# Patient Record
Sex: Male | Born: 1945
Health system: Southern US, Community
[De-identification: ages and names within clinical notes are randomized; demographics above are authoritative.]

## PROBLEM LIST (undated history)

## (undated) ENCOUNTER — Ambulatory Visit

## (undated) DIAGNOSIS — I10 Essential (primary) hypertension: Secondary | ICD-10-CM

## (undated) DIAGNOSIS — E039 Hypothyroidism, unspecified: Secondary | ICD-10-CM

## (undated) DIAGNOSIS — I493 Ventricular premature depolarization: Secondary | ICD-10-CM

## (undated) DIAGNOSIS — I2510 Atherosclerotic heart disease of native coronary artery without angina pectoris: Secondary | ICD-10-CM

## (undated) DIAGNOSIS — R011 Cardiac murmur, unspecified: Secondary | ICD-10-CM

## (undated) DIAGNOSIS — E079 Disorder of thyroid, unspecified: Secondary | ICD-10-CM

## (undated) DIAGNOSIS — I5189 Other ill-defined heart diseases: Secondary | ICD-10-CM

## (undated) DIAGNOSIS — M1990 Unspecified osteoarthritis, unspecified site: Secondary | ICD-10-CM

## (undated) DIAGNOSIS — T7840XA Allergy, unspecified, initial encounter: Secondary | ICD-10-CM

## (undated) HISTORY — DX: Hypothyroidism, unspecified: E03.9

## (undated) HISTORY — DX: Atherosclerotic heart disease of native coronary artery without angina pectoris: I25.10

## (undated) HISTORY — DX: Cardiac murmur, unspecified: R01.1

## (undated) HISTORY — DX: Allergy, unspecified, initial encounter: T78.40XA

## (undated) HISTORY — PX: HEMORRHOID SURGERY: SHX153

## (undated) HISTORY — PX: VASECTOMY: SHX75

## (undated) HISTORY — DX: Disorder of thyroid, unspecified: E07.9

## (undated) HISTORY — DX: Unspecified osteoarthritis, unspecified site: M19.90

## (undated) HISTORY — DX: Essential (primary) hypertension: I10

## (undated) HISTORY — DX: Other ill-defined heart diseases: I51.89

## (undated) HISTORY — DX: Ventricular premature depolarization: I49.3

## (undated) HISTORY — PX: COLONOSCOPY: SHX174

---

## 2000-04-24 ENCOUNTER — Ambulatory Visit (HOSPITAL_BASED_OUTPATIENT_CLINIC_OR_DEPARTMENT_OTHER): Admission: RE | Admit: 2000-04-24 | Discharge: 2000-04-24 | Payer: Self-pay | Admitting: General Surgery

## 2000-04-24 ENCOUNTER — Encounter (INDEPENDENT_AMBULATORY_CARE_PROVIDER_SITE_OTHER): Payer: Self-pay | Admitting: *Deleted

## 2007-09-20 ENCOUNTER — Ambulatory Visit: Payer: Self-pay | Admitting: Internal Medicine

## 2007-10-02 ENCOUNTER — Ambulatory Visit: Payer: Self-pay | Admitting: Internal Medicine

## 2012-08-07 ENCOUNTER — Encounter: Payer: Self-pay | Admitting: Emergency Medicine

## 2012-10-08 ENCOUNTER — Ambulatory Visit (INDEPENDENT_AMBULATORY_CARE_PROVIDER_SITE_OTHER): Payer: Medicare Other | Admitting: Emergency Medicine

## 2012-10-08 ENCOUNTER — Ambulatory Visit: Payer: Medicare Other

## 2012-10-08 VITALS — BP 180/77 | HR 72 | Temp 98.2°F | Resp 16 | Ht 67.0 in | Wt 209.0 lb

## 2012-10-08 DIAGNOSIS — M25511 Pain in right shoulder: Secondary | ICD-10-CM

## 2012-10-08 DIAGNOSIS — Z0000 Encounter for general adult medical examination without abnormal findings: Secondary | ICD-10-CM

## 2012-10-08 DIAGNOSIS — I10 Essential (primary) hypertension: Secondary | ICD-10-CM | POA: Insufficient documentation

## 2012-10-08 DIAGNOSIS — E782 Mixed hyperlipidemia: Secondary | ICD-10-CM

## 2012-10-08 DIAGNOSIS — E039 Hypothyroidism, unspecified: Secondary | ICD-10-CM | POA: Insufficient documentation

## 2012-10-08 DIAGNOSIS — M25519 Pain in unspecified shoulder: Secondary | ICD-10-CM

## 2012-10-08 LAB — LIPID PANEL
Cholesterol: 173 mg/dL (ref 0–200)
HDL: 37 mg/dL — ABNORMAL LOW (ref >39)
Triglycerides: 113 mg/dL (ref <150)

## 2012-10-08 LAB — POCT URINALYSIS DIPSTICK
Blood, UA: NEGATIVE
Protein, UA: NEGATIVE
Spec Grav, UA: 1.015
Urobilinogen, UA: 0.2
pH, UA: 6.5

## 2012-10-08 LAB — COMPREHENSIVE METABOLIC PANEL
BUN: 10 mg/dL (ref 6–23)
CO2: 30 mEq/L (ref 19–32)
Calcium: 9.5 mg/dL (ref 8.4–10.5)
Chloride: 104 mEq/L (ref 96–112)
Creat: 0.88 mg/dL (ref 0.50–1.35)
Glucose, Bld: 100 mg/dL — ABNORMAL HIGH (ref 70–99)
Total Bilirubin: 0.8 mg/dL (ref 0.3–1.2)

## 2012-10-08 LAB — POCT UA - MICROSCOPIC ONLY
Bacteria, U Microscopic: NEGATIVE
Casts, Ur, LPF, POC: NEGATIVE
Crystals, Ur, HPF, POC: NEGATIVE
Mucus, UA: NEGATIVE

## 2012-10-08 LAB — POCT CBC
HCT, POC: 45.4 % (ref 43.5–53.7)
MCH, POC: 34.3 pg — AB (ref 27–31.2)
MCV: 106 fL — AB (ref 80–97)
MID (cbc): 0.4 (ref 0–0.9)
Platelet Count, POC: 203 10*3/uL (ref 142–424)
RBC: 4.28 M/uL — AB (ref 4.69–6.13)
WBC: 5.1 10*3/uL (ref 4.6–10.2)

## 2012-10-08 MED ORDER — DILTIAZEM HCL ER BEADS 180 MG PO CP24: 180.0000 mg | ORAL_CAPSULE | Freq: Every day | ORAL | Status: DC

## 2012-10-08 MED ORDER — LEVOTHYROXINE SODIUM 88 MCG PO TABS: 88.0000 ug | ORAL_TABLET | Freq: Every day | ORAL | Status: DC

## 2012-10-08 NOTE — Progress Notes (Signed)
@[facility] @  Patient ID: Terry Vega MRN: [id number], DOB: September 05, 1946 66 y.o. Date of Encounter: 10/08/2012, 8:38 AM  Primary Physician: Lucilla Edin, MD  Chief Complaint: Physical (CPE)  HPI: 66 y.o. y/o male with history noted below here for CPE.  Doing well. No issues/complaints.  Review of Systems:  Consitutional: No fever, chills, fatigue, night sweats, lymphadenopathy, or weight changes. Eyes: No visual changes, eye redness, or discharge. ENT/Mouth: Ears: No otalgia, tinnitus, hearing loss, discharge. Nose: No congestion, rhinorrhea, sinus pain, or epistaxis. Throat: No sore throat, post nasal drip, or teeth pain. Cardiovascular: No CP, palpitations, diaphoresis, DOE, edema, orthopnea, PND. Respiratory: No cough, hemoptysis, SOB, or wheezing. Gastrointestinal: No anorexia, dysphagia, reflux, pain, nausea, vomiting, hematemesis, diarrhea, constipation, BRBPR, or melena. Genitourinary: No dysuria, frequency, urgency, hematuria, incontinence, nocturia, decreased urinary stream, discharge, impotence, or testicular pain/masses. Musculoskeletal: No decreased ROM, myalgias, stiffness, joint swelling, or weakness. He has had some discomfort in his right shoulder he feels is secondary to overuse with pain on abduction of the shoulder. He trouble with his knee last year but these symptoms resolved Skin: No rash, erythema, lesion changes, pain, warmth, jaundice, or pruritis. Neurological: No headache, dizziness, syncope, seizures, tremors, memory loss, coordination problems, or paresthesias. Psychological: No anxiety, depression, hallucinations, SI/HI. Endocrine: No fatigue, polydipsia, polyphagia, polyuria, or known diabetes. All other systems were reviewed and are otherwise negative.  Past Medical History  Diagnosis Date  . Hypertension   . Thyroid disease      Past Surgical History  Procedure Date  . Vasectomy     Home Meds:  Prior to Admission medications   Medication Sig  Start Date End Date Taking? Authorizing Provider  cholecalciferol (VITAMIN D) 1000 UNITS tablet Take 1,000 Units by mouth daily.   Yes Historical Provider, MD  diltiazem (TIAZAC) 180 MG 24 hr capsule Take 180 mg by mouth daily.   Yes Historical Provider, MD  levothyroxine (SYNTHROID, LEVOTHROID) 88 MCG tablet Take 88 mcg by mouth daily.   Yes Historical Provider, MD  Multiple Vitamin (MULTIVITAMIN) tablet Take 1 tablet by mouth daily.   Yes Historical Provider, MD  vitamin E 1000 UNIT capsule Take 1,000 Units by mouth daily.   Yes Historical Provider, MD    Allergies: Allergies not on file  History   Social History  . Marital Status: Married    Spouse Name: N/A    Number of Children: N/A  . Years of Education: N/A   Occupational History  . Not on file.   Social History Main Topics  . Smoking status: Never Smoker   . Smokeless tobacco: Never Used  . Alcohol Use: Yes  . Drug Use: No  . Sexually Active: Yes    Birth Control/ Protection: None   Other Topics Concern  . Not on file   Social History Narrative  . No narrative on file    Family History  Problem Relation Age of Onset  . Heart disease Mother   . Cancer Father   . Stroke Maternal Grandmother     Physical Exam:  Blood pressure 180/77, pulse 72, temperature 98.2 F (36.8 C), temperature source Oral, resp. rate 16, height 5\' 7"  (1.702 m), weight 209 lb (94.802 kg).  General: Well developed, well nourished, in no acute distress. HEENT: Normocephalic, atraumatic. Conjunctiva pink, sclera non-icteric. Pupils 2 mm constricting to 1 mm, round, regular, and equally reactive to light and accomodation. EOMI. Internal auditory canal clear. TMs with good cone of light and without pathology. Nasal mucosa pink.  Nares are without discharge. No sinus tenderness. Oral mucosa pink. Dentition no caries or dental disease . Pharynx without exudate.   Neck: Supple. Trachea midline. No thyromegaly. Full ROM. No lymphadenopathy. Lungs:  Clear to auscultation bilaterally without wheezes, rales, or rhonchi. Breathing is of normal effort and unlabored. Cardiovascular: RRR with S1 S2. No murmurs, rubs, or gallops appreciated. Distal pulses 2+ symmetrically. No carotid or abdominal bruits.  Abdomen: Soft, non-tender, non-distended with normoactive bowel sounds. No hepatosplenomegaly or masses. No rebound/guarding. No CVA tenderness. Without hernias.  Rectal: No external hemorrhoids or fissures. Rectal vault without masses.   Genitourinary:   circumcised male. No penile lesions. Testes descended bilaterally, and smooth without tenderness or masses.  Musculoskeletal: Full range of motion and 5/5 strength throughout. Without swelling, atrophy, tenderness, crepitus, or warmth. Extremities without clubbing, cyanosis, or edema. Calves supple. Skin: Warm and moist without erythema, ecchymosis, wounds, or rash. Neuro: A+Ox3. CN II-XII grossly intact. Moves all extremities spontaneously. Full sensation throughout. Normal gait. DTR 2+ throughout upper and lower extremities. Finger to nose intact. Psych:  Responds to questions appropriately with a normal affect.   Results for orders placed in visit on 10/08/12  POCT CBC      Component Value Range   WBC 5.1  4.6 - 10.2 K/uL   Lymph, poc 1.5  0.6 - 3.4   POC LYMPH PERCENT 29.9  10 - 50 %L   MID (cbc) 0.4  0 - 0.9   POC MID % 7.4  0 - 12 %M   POC Granulocyte 3.2  2 - 6.9   Granulocyte percent 62.7  37 - 80 %G   RBC 4.28 (*) 4.69 - 6.13 M/uL   Hemoglobin 14.7  14.1 - 18.1 g/dL   HCT, POC 16.1  09.6 - 53.7 %   MCV 106.0 (*) 80 - 97 fL   MCH, POC 34.3 (*) 27 - 31.2 pg   MCHC 32.4  31.8 - 35.4 g/dL   RDW, POC 04.5     Platelet Count, POC 203  142 - 424 K/uL   MPV 8.1  0 - 99.8 fL  POCT UA - MICROSCOPIC ONLY      Component Value Range   WBC, Ur, HPF, POC neg     RBC, urine, microscopic neg     Bacteria, U Microscopic neg     Mucus, UA neg     Epithelial cells, urine per micros 0-1       Crystals, Ur, HPF, POC neg     Casts, Ur, LPF, POC neg     Yeast, UA neg    POCT URINALYSIS DIPSTICK      Component Value Range   Color, UA yellow     Clarity, UA clear     Glucose, UA neg     Bilirubin, UA neg     Ketones, UA neg     Spec Grav, UA 1.015     Blood, UA neg     pH, UA 6.5     Protein, UA neg     Urobilinogen, UA 0.2     Nitrite, UA neg     Leukocytes, UA Negative    IFOBT (OCCULT BLOOD)      Component Value Range   IFOBT Negative    [facility] reading (PRIMARY) by  Dr. Cleta Alberts there are degenerative changes at the a.c. joint but no other abnormality .  Studies:   CMET, Lipid, PSA,        Assessment/Plan   66  y.o. y/o  male here for CPE. He is in good health he does not smoke is up-to-date on his colonoscopy and his family history is unremarkable. He does have a history of congestive heart failure in his mother and prostate cancer his father but he was 74-his blood pressure is night or he would need to follow this closely. He is on blood pressure medications and thyroid medications. I spoke to the patient regarding his elevated blood pressure he is going to start checking it more regularly and take his medication on a regular basis .  Signed, Earl Lites, MD 10/08/2012 8:38 AM

## 2012-10-09 ENCOUNTER — Encounter: Payer: Self-pay | Admitting: *Deleted

## 2012-10-09 LAB — PSA, MEDICARE: PSA: 0.55 ng/mL (ref <=4.00)

## 2012-11-26 ENCOUNTER — Telehealth: Payer: Self-pay

## 2012-11-26 DIAGNOSIS — I10 Essential (primary) hypertension: Secondary | ICD-10-CM

## 2012-11-26 NOTE — Telephone Encounter (Signed)
PT  STATES THE WRONG MEDICINE WAS CALLED AND HE WOULD LIKE TO HAVE IT CORRECTED. WE CALLED IN THE TIAZAC, BUT IT SHOULD HAVE BEEN THE TAZTAIXT 240MG S. PLEASE CALL [phone] OR HIS CELL AT [phone]   [facility] ON [address]

## 2012-11-27 NOTE — Telephone Encounter (Signed)
Pt reports that he has not had to p/up his RF of his Taztia Vega 240 mg QD since Dr Cleta Alberts Rxd it on 10/08/12, but noticed his new Rx is for diltiazem (Tiazac) 180 mg and he needs the NAME Terry Vega because the generic makes his feet swell. Pt also does not think that Dr Cleta Alberts intended to decrease it to 180 mg. I checked w/pharmacy and they can fill it w/the name brand Verna Czech and they verified that pt had been taking the 240 mg. Dr Cleta Alberts, I wanted to check w/you to make sure that you didn't intend to decrease the strength. At OV you discussed w/pt the need to check his BP more often bc it was high.

## 2012-11-27 NOTE — Telephone Encounter (Signed)
Please clarify with pt.  Per last OV 10/08/12, pt was prescribed Tiazac (diltiazem) 24 hour at 180mg .  I do not see any other refill requests or phone calls.

## 2012-11-27 NOTE — Telephone Encounter (Signed)
Please call inTaztia XT 240 one a day #30 refill x1 year. Make sure it is the same medicine he's been getting at the pharmacy.

## 2012-11-28 MED ORDER — DILTIAZEM HCL ER BEADS 240 MG PO CP24: 240.0000 mg | ORAL_CAPSULE | Freq: Every day | ORAL | Status: DC

## 2012-11-28 NOTE — Telephone Encounter (Signed)
Notified pt that Dr Cleta Alberts does intend for him to stay at the 240 mg strength of Taztia XT. I have sent to new Rx into the pharmacy.

## 2013-10-09 ENCOUNTER — Other Ambulatory Visit: Payer: Self-pay | Admitting: Emergency Medicine

## 2013-10-09 ENCOUNTER — Ambulatory Visit: Payer: Medicare Other

## 2013-10-09 ENCOUNTER — Ambulatory Visit (INDEPENDENT_AMBULATORY_CARE_PROVIDER_SITE_OTHER): Payer: Medicare Other | Admitting: Emergency Medicine

## 2013-10-09 VITALS — BP 144/86 | HR 73 | Temp 98.9°F | Resp 17 | Ht 66.5 in | Wt 207.0 lb

## 2013-10-09 DIAGNOSIS — R0602 Shortness of breath: Secondary | ICD-10-CM

## 2013-10-09 DIAGNOSIS — E559 Vitamin D deficiency, unspecified: Secondary | ICD-10-CM

## 2013-10-09 DIAGNOSIS — R0902 Hypoxemia: Secondary | ICD-10-CM

## 2013-10-09 DIAGNOSIS — I10 Essential (primary) hypertension: Secondary | ICD-10-CM

## 2013-10-09 DIAGNOSIS — I493 Ventricular premature depolarization: Secondary | ICD-10-CM

## 2013-10-09 DIAGNOSIS — Z23 Encounter for immunization: Secondary | ICD-10-CM

## 2013-10-09 DIAGNOSIS — Z139 Encounter for screening, unspecified: Secondary | ICD-10-CM

## 2013-10-09 DIAGNOSIS — E039 Hypothyroidism, unspecified: Secondary | ICD-10-CM

## 2013-10-09 DIAGNOSIS — Z0000 Encounter for general adult medical examination without abnormal findings: Secondary | ICD-10-CM

## 2013-10-09 DIAGNOSIS — I4949 Other premature depolarization: Secondary | ICD-10-CM

## 2013-10-09 LAB — COMPREHENSIVE METABOLIC PANEL
ALT: 12 U/L (ref 0–53)
AST: 18 U/L (ref 0–37)
Albumin: 4.3 g/dL (ref 3.5–5.2)
BUN: 13 mg/dL (ref 6–23)
CO2: 30 mEq/L (ref 19–32)
Calcium: 9.2 mg/dL (ref 8.4–10.5)
Chloride: 101 mEq/L (ref 96–112)
Potassium: 4.4 mEq/L (ref 3.5–5.3)
Total Protein: 7.6 g/dL (ref 6.0–8.3)

## 2013-10-09 LAB — POCT CBC
Lymph, poc: 1.5 (ref 0.6–3.4)
MCH, POC: 33.3 pg — AB (ref 27–31.2)
MCHC: 31.1 g/dL — AB (ref 31.8–35.4)
MCV: 107.1 fL — AB (ref 80–97)
MID (cbc): 0.4 (ref 0–0.9)
MPV: 7.6 fL (ref 0–99.8)
POC LYMPH PERCENT: 21.5 %L (ref 10–50)
POC MID %: 6.4 %M (ref 0–12)
Platelet Count, POC: 190 10*3/uL (ref 142–424)
RDW, POC: 14.1 %
WBC: 6.9 10*3/uL (ref 4.6–10.2)

## 2013-10-09 LAB — POCT URINALYSIS DIPSTICK
Blood, UA: NEGATIVE
Ketones, UA: NEGATIVE
Protein, UA: NEGATIVE
Spec Grav, UA: 1.015
Urobilinogen, UA: 0.2
pH, UA: 7

## 2013-10-09 LAB — LIPID PANEL: LDL Cholesterol: 109 mg/dL — ABNORMAL HIGH (ref 0–99)

## 2013-10-09 LAB — IFOBT (OCCULT BLOOD): IFOBT: NEGATIVE

## 2013-10-09 LAB — VITAMIN B12: Vitamin B-12: 149 pg/mL — ABNORMAL LOW (ref 211–911)

## 2013-10-09 LAB — D-DIMER, QUANTITATIVE (NOT AT ARMC): D-Dimer, Quant: 0.27 ug/mL-FEU (ref 0.00–0.48)

## 2013-10-09 LAB — TSH: TSH: 5.683 u[IU]/mL — ABNORMAL HIGH (ref 0.350–4.500)

## 2013-10-09 LAB — PSA, MEDICARE: PSA: 0.7 ng/mL (ref <=4.00)

## 2013-10-09 LAB — FOLATE: Folate: >20 ng/mL

## 2013-10-09 MED ORDER — DILTIAZEM HCL ER BEADS 240 MG PO CP24: 240.0000 mg | ORAL_CAPSULE | Freq: Every day | ORAL | Status: DC

## 2013-10-09 MED ORDER — LEVOTHYROXINE SODIUM 88 MCG PO TABS: 88.0000 ug | ORAL_TABLET | Freq: Every day | ORAL | Status: DC

## 2013-10-09 NOTE — Patient Instructions (Signed)
Premature Ventricular Contraction Premature ventricular contraction (PVC) is an irregularity of the heart rhythm involving extra or skipped heartbeats. In some cases, they may occur without obvious cause or heart disease. Other times, they can be caused by an electrolyte change in the blood. These need to be corrected. They can also be seen when there is not enough oxygen going to the heart. A common cause of this is plaque or cholesterol buildup. This buildup decreases the blood supply to the heart. In addition, extra beats may be caused or aggravated by:  Excessive smoking.  Alcohol consumption.  Caffeine.  Certain medications  Some street drugs. SYMPTOMS   The sensation of feeling your heart skipping a beat (palpitations).  In many cases, the person may have no symptoms. SIGNS AND TESTS   A physical examination may show an occasional irregularity, but if the PVC beats do not happen often, they may not be found on physical exam.  Blood pressure is usually normal.  Other tests that may find extra beats of the heart are:  An EKG (electrocardiogram)  A Holter monitor which can monitor your heart over longer periods of time  An Angiogram (study of the heart arteries). TREATMENT  Usually extra heartbeats do not need treatment. The condition is treated only if symptoms are severe or if extra beats are very frequent or are causing problems. An underlying cause, if discovered, may also require treatment.  Treatment may also be needed if there may be a risk for other more serious cardiac arrhythmias.  PREVENTION   Moderation in caffeine, alcohol, and tobacco use may reduce the risk of ectopic heartbeats in some people.  Exercise often helps people who lead a sedentary (inactive) lifestyle. PROGNOSIS  PVC heartbeats are generally harmless and do not need treatment.  RISKS AND COMPLICATIONS   Ventricular tachycardia (occasionally).  There usually are no complications.  Other  arrhythmias (occasionally). SEEK IMMEDIATE MEDICAL CARE IF:   You feel palpitations that are frequent or continual.  You develop chest pain or other problems such as shortness of breath, sweating, or nausea and vomiting.  You become light-headed or faint (pass out).  You get worse or do not improve with treatment. Document Released: 06/17/2004 Document Revised: 01/23/2012 Document Reviewed: 12/28/2007 Exit[employer] Patient Information 2014 [employer].  

## 2013-10-09 NOTE — Progress Notes (Addendum)
Subjective:  This chart was scribed for Lesle Chris, MD by Carl Best, Medical Scribe. This patient was seen in Room 8 and the patient's care was started at 9:30 AM.   Patient ID: Terry Vega, male    DOB: Jan 15, 1946, 67 y.o.   MRN: [id number]  HPI HPI Comments: Terry Vega is a 67 y.o. male who presents to the [facility] needing an annual exam.     Review of Systems  All other systems reviewed and are negative.     Objective:  Physical Exam  Nursing note and vitals reviewed. Constitutional: He is oriented to person, place, and time. He appears well-developed and well-nourished. No distress.  HENT:  Head: Normocephalic and atraumatic.  Right Ear: External ear normal.  Left Ear: External ear normal.  Nose: Nose normal.  Mouth/Throat: Oropharynx is clear and moist. No oropharyngeal exudate.  Eyes: EOM are normal. Pupils are equal, round, and reactive to light.  Neck: Normal range of motion. Neck supple. No thyromegaly present.  Cardiovascular: Exam reveals no gallop and no friction rub.   No murmur heard. Every third and fourth beat is an early beat.  Pulmonary/Chest: Effort normal and breath sounds normal. No respiratory distress. He has no wheezes. He has no rales.  Abdominal: Soft. Bowel sounds are normal. There is no tenderness.  Musculoskeletal: Normal range of motion. He exhibits no edema and no tenderness.  Lymphadenopathy:    He has no cervical adenopathy.  Neurological: He is alert and oriented to person, place, and time. He displays normal reflexes.  Skin: Skin is warm and dry.  Psychiatric: He has a normal mood and affect. His behavior is normal.   Results for orders placed in visit on 10/09/13  POCT CBC      Result Value Range   WBC 6.9  4.6 - 10.2 K/uL   Lymph, poc 1.5  0.6 - 3.4   POC LYMPH PERCENT 21.5  10 - 50 %L   MID (cbc) 0.4  0 - 0.9   POC MID % 6.4  0 - 12 %M   POC Granulocyte 5.0  2 - 6.9   Granulocyte percent 72.1   37 - 80 %G   RBC 4.42 (*) 4.69 - 6.13 M/uL   Hemoglobin 14.7  14.1 - 18.1 g/dL   HCT, POC 78.2  95.6 - 53.7 %   MCV 107.1 (*) 80 - 97 fL   MCH, POC 33.3 (*) 27 - 31.2 pg   MCHC 31.1 (*) 31.8 - 35.4 g/dL   RDW, POC 21.3     Platelet Count, POC 190  142 - 424 K/uL   MPV 7.6  0 - 99.8 fL  IFOBT (OCCULT BLOOD)      Result Value Range   IFOBT Negative    POCT URINALYSIS DIPSTICK      Result Value Range   Color, UA yellow     Clarity, UA clear     Glucose, UA neg     Bilirubin, UA neg     Ketones, UA neg     Spec Grav, UA 1.015     Blood, UA neg     pH, UA 7.0     Protein, UA neg     Urobilinogen, UA 0.2     Nitrite, UA neg     Leukocytes, UA Negative      EKG frequent unifocal PVCs [facility] reading (PRIMARY) by  Dr. Cleta Alberts there is a linear atelectatic area seen on  the lateral to Results for orders placed in visit on 10/09/13  Results for orders placed in visit on 10/09/13  POCT CBC      Result Value Range   WBC 6.9  4.6 - 10.2 K/uL   Lymph, poc 1.5  0.6 - 3.4   POC LYMPH PERCENT 21.5  10 - 50 %L   MID (cbc) 0.4  0 - 0.9   POC MID % 6.4  0 - 12 %M   POC Granulocyte 5.0  2 - 6.9   Granulocyte percent 72.1  37 - 80 %G   RBC 4.42 (*) 4.69 - 6.13 M/uL   Hemoglobin 14.7  14.1 - 18.1 g/dL   HCT, POC 78.2  95.6 - 53.7 %   MCV 107.1 (*) 80 - 97 fL   MCH, POC 33.3 (*) 27 - 31.2 pg   MCHC 31.1 (*) 31.8 - 35.4 g/dL   RDW, POC 21.3     Platelet Count, POC 190  142 - 424 K/uL   MPV 7.6  0 - 99.8 fL  IFOBT (OCCULT BLOOD)      Result Value Range   IFOBT Negative    POCT URINALYSIS DIPSTICK      Result Value Range   Color, UA yellow     Clarity, UA clear     Glucose, UA neg     Bilirubin, UA neg     Ketones, UA neg     Spec Grav, UA 1.015     Blood, UA neg     pH, UA 7.0     Protein, UA neg     Urobilinogen, UA 0.2     Nitrite, UA neg     Leukocytes, UA Negative           Assessment & Plan:  Patient had frequent unifocal PVCs. Otherwise EKG is normal. His MCV was  also have added a folate and B12 level. Referral will be made to cardiology for evaluation of shortness of breath and his PVCs. When he returned to clinic to repeat his pulse ox was 94 I walked him around the hall x2 it dropped to 92 and then I walked him around another round and it stayed 94. If his cardiac evaluation is negative A1 pulmonary to take a look at him. His chest x-ray report from the radiologist is pending. D-dimer was done stat and was normal. Chest x-ray report from the radiologist was atelectatic area on the left.

## 2013-10-15 ENCOUNTER — Ambulatory Visit (INDEPENDENT_AMBULATORY_CARE_PROVIDER_SITE_OTHER): Payer: Medicare Other | Admitting: Cardiovascular Disease

## 2013-10-15 ENCOUNTER — Encounter: Payer: Self-pay | Admitting: Cardiovascular Disease

## 2013-10-15 VITALS — BP 168/88 | HR 72 | Ht 69.0 in | Wt 210.0 lb

## 2013-10-15 DIAGNOSIS — I4949 Other premature depolarization: Secondary | ICD-10-CM

## 2013-10-15 DIAGNOSIS — I10 Essential (primary) hypertension: Secondary | ICD-10-CM

## 2013-10-15 DIAGNOSIS — I493 Ventricular premature depolarization: Secondary | ICD-10-CM

## 2013-10-15 DIAGNOSIS — R0609 Other forms of dyspnea: Secondary | ICD-10-CM

## 2013-10-15 DIAGNOSIS — R0600 Dyspnea, unspecified: Secondary | ICD-10-CM

## 2013-10-15 NOTE — Assessment & Plan Note (Signed)
Blood pressure is elevated today. If blood pressure continues to be high, another antihypertensive medication such as an ACE inhibitor/ARB or a thiazide diuretic might be indicated.

## 2013-10-15 NOTE — Assessment & Plan Note (Signed)
The patient was noted to have frequent on a multiple PVCs on recent EKG. His symptoms include mild palpitations and exertional dyspnea without chest pain. His cardiac exam is unremarkable and I don't hear any cardiac murmurs. I think it's important to exclude ischemic or structural heart disease. Thus, I recommend evaluation with a treadmill nuclear stress test. Due to frequent PVCs, a GXT alone will likely be not interpretable. If stress test is unremarkable, I recommend no further evaluation at this point. I asked him to avoid excessive caffeine intake. He is already on diltiazem which should help with this. He is to followup on a yearly basis to ensure stability.

## 2013-10-15 NOTE — Progress Notes (Signed)
Primary care physician: Dr. Cleta Alberts  HPI  Terry Vega is a pleasant 67 year old male who is here today for evaluation of PVCs. He has no previous cardiac history but was told many years ago about a heart murmur. He has prolonged history of hypertension which has been reasonably controlled on diltiazem. He has hypothyroidism and has been on replacement therapy for many years. Recent TSH was slightly elevated. He was noted recently to have unifocal PVCs. He gets occasional palpitations but there has been no tachycardia, dizziness, syncope or presyncope. He denies any chest discomfort. He has noticed progressive exertional dyspnea with no orthopnea or PND. He is a lifelong nonsmoker. There is no family history of premature coronary artery disease. He is retired.  He consumes one to 2 cups of coffee a day. He drinks alcohol occasionally.  No Known Allergies   Current Outpatient Prescriptions on File Prior to Visit  Medication Sig Dispense Refill  . cholecalciferol (VITAMIN D) 1000 UNITS tablet Take 1,000 Units by mouth daily.      Marland Kitchen diltiazem (TIAZAC) 240 MG 24 hr capsule Take 1 capsule (240 mg total) by mouth daily.  90 capsule  3  . levothyroxine (SYNTHROID, LEVOTHROID) 88 MCG tablet Take 1 tablet (88 mcg total) by mouth daily.  90 tablet  3  . Multiple Vitamin (MULTIVITAMIN) tablet Take 1 tablet by mouth daily.      . vitamin E 1000 UNIT capsule Take 1,000 Units by mouth daily.       No current facility-administered medications on file prior to visit.     Past Medical History  Diagnosis Date  . Hypertension   . Thyroid disease      Past Surgical History  Procedure Laterality Date  . Vasectomy       Family History  Problem Relation Age of Onset  . Heart disease Mother   . Cancer Father   . Stroke Maternal Grandmother      History   Social History  . Marital Status: Married    Spouse Name: N/A    Number of Children: N/A  . Years of Education: N/A   Occupational History    . Not on file.   Social History Main Topics  . Smoking status: Never Smoker   . Smokeless tobacco: Never Used  . Alcohol Use: Yes  . Drug Use: No  . Sexual Activity: Yes    Birth Control/ Protection: None   Other Topics Concern  . Not on file   Social History Narrative  . No narrative on file     ROS A 10 point review of system was performed. It is negative other than that mentioned in the history of present illness.   PHYSICAL EXAM   BP 168/88  Pulse 72  Ht 5\' 9"  (1.753 m)  Wt 210 lb (95.255 kg)  BMI 31.00 kg/m2 Constitutional: He is oriented to person, place, and time. He appears well-developed and well-nourished. No distress.  HENT: No nasal discharge.  Head: Normocephalic and atraumatic.  Eyes: Pupils are equal and round.  No discharge. Neck: Normal range of motion. Neck supple. No JVD present. No thyromegaly present.  Cardiovascular: Normal rate, regular rhythm, normal heart sounds. Exam reveals no gallop and no friction rub. No murmur heard.  Pulmonary/Chest: Effort normal and breath sounds normal. No stridor. No respiratory distress. He has no wheezes. He has no rales. He exhibits no tenderness.  Abdominal: Soft. Bowel sounds are normal. He exhibits no distension. There is no tenderness. There is  no rebound and no guarding.  Musculoskeletal: Normal range of motion. He exhibits no edema and no tenderness.  Neurological: He is alert and oriented to person, place, and time. Coordination normal.  Skin: Skin is warm and dry. No rash noted. He is not diaphoretic. No erythema. No pallor.  Psychiatric: He has a normal mood and affect. His behavior is normal. Judgment and thought content normal.  Vascular: Radial pulses are normal, femoral and distal pulses are normal.     Recent EKG was reviewed which showed normal sinus rhythm with frequent monomorphic PVCs.   ASSESSMENT AND PLAN

## 2013-10-15 NOTE — Patient Instructions (Signed)
Your physician has requested that you have an exercise stress myoview. For further information please visit [email or web address] Please follow instruction sheet, as given.  Your physician wants you to follow-up in: 1 YEAR with Dr Kirke Corin.  You will receive a reminder letter in the mail two months in advance. If you don't receive a letter, please call our office to schedule the follow-up appointment.  Your physician recommends that you continue on your current medications as directed. Please refer to the Current Medication list given to you today.

## 2013-10-30 ENCOUNTER — Encounter: Payer: Self-pay | Admitting: Cardiology

## 2013-10-30 ENCOUNTER — Ambulatory Visit (HOSPITAL_COMMUNITY): Payer: Medicare Other | Attending: Cardiology | Admitting: Radiology

## 2013-10-30 VITALS — BP 145/90 | Ht 69.0 in | Wt 206.0 lb

## 2013-10-30 DIAGNOSIS — R0989 Other specified symptoms and signs involving the circulatory and respiratory systems: Secondary | ICD-10-CM | POA: Insufficient documentation

## 2013-10-30 DIAGNOSIS — R0600 Dyspnea, unspecified: Secondary | ICD-10-CM

## 2013-10-30 DIAGNOSIS — I10 Essential (primary) hypertension: Secondary | ICD-10-CM | POA: Insufficient documentation

## 2013-10-30 DIAGNOSIS — I4949 Other premature depolarization: Secondary | ICD-10-CM

## 2013-10-30 DIAGNOSIS — R002 Palpitations: Secondary | ICD-10-CM | POA: Insufficient documentation

## 2013-10-30 DIAGNOSIS — R0609 Other forms of dyspnea: Secondary | ICD-10-CM | POA: Insufficient documentation

## 2013-10-30 DIAGNOSIS — I493 Ventricular premature depolarization: Secondary | ICD-10-CM

## 2013-10-30 MED ORDER — TECHNETIUM TC 99M SESTAMIBI GENERIC - CARDIOLITE
10.8000 | Freq: Once | INTRAVENOUS | Status: AC | PRN
  Administered 2013-10-30: 11 via INTRAVENOUS

## 2013-10-30 MED ORDER — TECHNETIUM TC 99M SESTAMIBI GENERIC - CARDIOLITE
33.0000 | Freq: Once | INTRAVENOUS | Status: AC | PRN
  Administered 2013-10-30: 33 via INTRAVENOUS

## 2013-10-30 NOTE — Progress Notes (Signed)
[facility] 3 NUCLEAR MED [address] [place], [place] [address] [phone]    Cardiology Nuclear Med Study  Terry Vega is a 67 y.o. male     MRN : [id number]     DOB: 09-Sep-1946  Procedure Date: 10/30/2013  Nuclear Med Background Indication for Stress Test:  Evaluation for Ischemia History:  NO CAD HX Cardiac Risk Factors: Hypertension  Symptoms:  DOE and Palpitations   Nuclear Pre-Procedure Caffeine/Decaff Intake:  None NPO After: 7:30am   Lungs:  clear O2 Sat: 95% on room air. IV 0.9% NS with Angio Cath:  22g  IV Site: R Hand  IV Started by:  Bonnita Levan, RN  Chest Size (in):  46 Cup Size: n/a  Height: 5\' 9"  (1.753 m)  Weight:  206 lb (93.441 kg)  BMI:  Body mass index is 30.41 kg/(m^2). Tech Comments:  N/A    Nuclear Med Study 1 or 2 day study: 1 day  Stress Test Type:  Stress  Reading MD: Tobias Alexander, MD  Order Authorizing Provider:  Lorine Bears, MD  Resting Radionuclide: Technetium 3m Sestamibi  Resting Radionuclide Dose: 10.8 mCi   Stress Radionuclide:  Technetium 66m Sestamibi  Stress Radionuclide Dose: 33.0 mCi           Stress Protocol Rest HR: 68 Stress HR: 142  Rest BP: 145/90 Stress BP: 191/85  Exercise Time (min): 4:30 METS: 6.40   Predicted Max HR: 153 bpm % Max HR: 92.81 bpm Rate Pressure Product: 21308   Dose of Adenosine (mg):  n/a Dose of Lexiscan: n/a mg  Dose of Atropine (mg): n/a Dose of Dobutamine: n/a mcg/kg/min (at max HR)  Stress Test Technologist: Milana Na, EMT-P  Nuclear Technologist:  Domenic Polite, CNMT     Rest Procedure:  Myocardial perfusion imaging was performed at rest 45 minutes following the intravenous administration of Technetium 80m Sestamibi. Rest ECG: NSR, frequent PVCs  Stress Procedure:  The patient exercised on the treadmill utilizing the Bruce Protocol for 4:30 minutes. The patient stopped due to sob, fatigue, and denied any chest pain.  Technetium 69m Sestamibi was  injected at peak exercise and myocardial perfusion imaging was performed after a brief delay. Stress ECG: No significant change from baseline ECG, frequent multifocal PVCs  QPS Raw Data Images:  Normal; no motion artifact; normal heart/lung ratio. Stress Images:  Normal homogeneous uptake in all areas of the myocardium. Rest Images:  Normal homogeneous uptake in all areas of the myocardium. Subtraction (SDS):  No evidence of ischemia. Transient Ischemic Dilatation (Normal <1.22):  1.06 Lung/Heart Ratio (Normal <0.45):  0.23  Quantitative Gated Spect Images QGS EDV:  n/a ml QGS ESV:  n/a ml  Impression Exercise Capacity:  Good exercise capacity. BP Response:The resting blood pressure was elevated (186/90).   Normal blood pressure response to stress. Clinical Symptoms:  There is dyspnea. ECG Impression:  No significant ST segment change suggestive of ischemia. Comparison with Prior Nuclear Study: No previous nuclear study performed  Overall Impression:  Low risk stress nuclear study with no evidence of ischemia..  LV Ejection Fraction: Study not gated.  LV Wall Motion:  not performed.  Tobias Alexander, Rexene Edison 10/30/2013

## 2013-11-01 ENCOUNTER — Telehealth: Payer: Self-pay

## 2013-11-01 NOTE — Telephone Encounter (Signed)
Message copied by Marilynne Halsted on Fri Nov 01, 2013 12:52 PM ------      Message from: Oneida Arenas      Created: Fri Nov 01, 2013 12:48 PM       Routing to [facility] for follow-up ------

## 2013-11-08 ENCOUNTER — Ambulatory Visit (INDEPENDENT_AMBULATORY_CARE_PROVIDER_SITE_OTHER): Payer: Medicare Other | Admitting: Cardiology

## 2013-11-08 DIAGNOSIS — R0600 Dyspnea, unspecified: Secondary | ICD-10-CM

## 2013-11-08 DIAGNOSIS — I359 Nonrheumatic aortic valve disorder, unspecified: Secondary | ICD-10-CM

## 2013-11-08 DIAGNOSIS — R0609 Other forms of dyspnea: Secondary | ICD-10-CM

## 2013-12-03 ENCOUNTER — Other Ambulatory Visit: Payer: Self-pay

## 2013-12-03 DIAGNOSIS — E039 Hypothyroidism, unspecified: Secondary | ICD-10-CM

## 2013-12-03 MED ORDER — LEVOTHYROXINE SODIUM 88 MCG PO TABS: 88.0000 ug | ORAL_TABLET | Freq: Every day | ORAL | Status: DC

## 2013-12-03 MED ORDER — DILTIAZEM HCL ER BEADS 240 MG PO CP24: 240.0000 mg | ORAL_CAPSULE | Freq: Every day | ORAL | Status: DC

## 2014-04-14 ENCOUNTER — Telehealth: Payer: Self-pay

## 2014-04-14 NOTE — Telephone Encounter (Signed)
Pt requesting to speak with dr. Cleta Alberts re his blood pressure meds causing swellling feet,anikles   Best phone for pt is [phone]    Pharmacy [facility] mail order

## 2014-04-15 NOTE — Telephone Encounter (Signed)
LMVM to CB. 

## 2014-04-15 NOTE — Telephone Encounter (Signed)
Pt returned call , please call back   °

## 2014-04-15 NOTE — Telephone Encounter (Signed)
Spoke with pt. Says he just recently changed insurance and now get his Diltiazem through mail order. It's a different manufacturer and he says ever since he's been taking this brand, his feet have been swelling. Should he RTC?

## 2014-04-16 NOTE — Telephone Encounter (Signed)
I work 10- 7 on Friday and all next week. He can return to clinic to see me whenever can be

## 2014-04-16 NOTE — Telephone Encounter (Signed)
Spoke to patient and gave him Dr Ellis Parents schedule.  He said he will come into be seen.

## 2014-04-18 ENCOUNTER — Telehealth: Payer: Self-pay

## 2014-04-18 ENCOUNTER — Ambulatory Visit: Payer: Medicare Other

## 2014-04-18 ENCOUNTER — Ambulatory Visit (INDEPENDENT_AMBULATORY_CARE_PROVIDER_SITE_OTHER): Payer: Medicare HMO | Admitting: Emergency Medicine

## 2014-04-18 VITALS — BP 130/72 | HR 58 | Temp 97.7°F | Resp 18 | Ht 66.5 in | Wt 211.4 lb

## 2014-04-18 DIAGNOSIS — R609 Edema, unspecified: Secondary | ICD-10-CM

## 2014-04-18 DIAGNOSIS — I10 Essential (primary) hypertension: Secondary | ICD-10-CM

## 2014-04-18 DIAGNOSIS — I4949 Other premature depolarization: Secondary | ICD-10-CM

## 2014-04-18 DIAGNOSIS — R002 Palpitations: Secondary | ICD-10-CM

## 2014-04-18 DIAGNOSIS — I493 Ventricular premature depolarization: Secondary | ICD-10-CM

## 2014-04-18 DIAGNOSIS — E039 Hypothyroidism, unspecified: Secondary | ICD-10-CM

## 2014-04-18 LAB — BASIC METABOLIC PANEL
BUN: 14 mg/dL (ref 6–23)
CALCIUM: 9.3 mg/dL (ref 8.4–10.5)
CHLORIDE: 101 meq/L (ref 96–112)
CO2: 29 meq/L (ref 19–32)
Creat: 0.93 mg/dL (ref 0.50–1.35)
Glucose, Bld: 84 mg/dL (ref 70–99)
Potassium: 4.4 mEq/L (ref 3.5–5.3)
Sodium: 140 mEq/L (ref 135–145)

## 2014-04-18 LAB — TSH: TSH: 5.271 u[IU]/mL — ABNORMAL HIGH (ref 0.350–4.500)

## 2014-04-18 LAB — POCT URINALYSIS DIPSTICK
Bilirubin, UA: NEGATIVE
Glucose, UA: NEGATIVE
Ketones, UA: NEGATIVE
Leukocytes, UA: NEGATIVE
Nitrite, UA: NEGATIVE
Protein, UA: NEGATIVE
RBC UA: NEGATIVE
Spec Grav, UA: 1.02
UROBILINOGEN UA: 1
pH, UA: 7

## 2014-04-18 LAB — POCT CBC
Granulocyte percent: 65 %G (ref 37–80)
HCT, POC: 44.7 % (ref 43.5–53.7)
Hemoglobin: 14.3 g/dL (ref 14.1–18.1)
LYMPH, POC: 1.8 (ref 0.6–3.4)
MCH: 30.4 pg (ref 27–31.2)
MCHC: 32 g/dL (ref 31.8–35.4)
MCV: 95.1 fL (ref 80–97)
MID (cbc): 0.5 (ref 0–0.9)
MPV: 10.4 fL (ref 0–99.8)
POC Granulocyte: 4.3 (ref 2–6.9)
POC LYMPH PERCENT: 27.9 %L (ref 10–50)
POC MID %: 7.1 % (ref 0–12)
Platelet Count, POC: 194 10*3/uL (ref 142–424)
RBC: 4.7 M/uL (ref 4.69–6.13)
RDW, POC: 13.7 %
WBC: 6.6 10*3/uL (ref 4.6–10.2)

## 2014-04-18 LAB — T4, FREE: Free T4: 1.15 ng/dL (ref 0.80–1.80)

## 2014-04-18 MED ORDER — DILTIAZEM HCL ER BEADS 240 MG PO CP24: 240.0000 mg | ORAL_CAPSULE | Freq: Every day | ORAL | Status: DC

## 2014-04-18 NOTE — Telephone Encounter (Signed)
Message copied by Migdalia Dk on Fri Apr 18, 2014  2:11 PM ------      Message from: Lorine Bears A      Created: Fri Apr 18, 2014  1:19 PM       I was called by Dr. Cleta Alberts due to increased PVCs and some fluid retention. Please arrange for a 24 hr Holter monitor and follow up with me after the monitor.  ------

## 2014-04-18 NOTE — Telephone Encounter (Signed)
Placed order for 24 hr monitor

## 2014-04-18 NOTE — Progress Notes (Signed)
   Subjective:    Patient ID: Terry Vega, male    DOB: 1945-12-25, 68 y.o.   MRN: [id number]  HPI Terry Vega noting over the past few weeks he has developed increasing swelling of his lower extremities. The swelling seems to be worse on the left than the right. He denies chest pain or shortness of breath. He has a history of frequent PVCs in fact he had an echocardiogram and stress test done in December. Overall he is feeling well. He has an upcoming trip planned to [place].    Review of Systems     Objective:   Physical Exam HEENT is unremarkable neck supple chest clear heart is very irregular rhythm without murmurs abdomen soft nontender extremity exam reveals 2+ edema on the left 1+ edema on the right there are bilateral varicose veins Results for orders placed in visit on 04/18/14  POCT CBC      Result Value Ref Range   WBC 6.6  4.6 - 10.2 K/uL   Lymph, poc 1.8  0.6 - 3.4   POC LYMPH PERCENT 27.9  10 - 50 %L   MID (cbc) 0.5  0 - 0.9   POC MID % 7.1  0 - 12 %M   POC Granulocyte 4.3  2 - 6.9   Granulocyte percent 65.0  37 - 80 %G   RBC 4.70  4.69 - 6.13 M/uL   Hemoglobin 14.3  14.1 - 18.1 g/dL   HCT, POC 97.4  16.3 - 53.7 %   MCV 95.1  80 - 97 fL   MCH, POC 30.4  27 - 31.2 pg   MCHC 32.0  31.8 - 35.4 g/dL   RDW, POC 84.5     Platelet Count, POC 194  142 - 424 K/uL   MPV 10.4  0 - 99.8 fL  POCT URINALYSIS DIPSTICK      Result Value Ref Range   Color, UA yellow     Clarity, UA clear     Glucose, UA neg     Bilirubin, UA neg     Ketones, UA neg     Spec Grav, UA 1.020     Blood, UA neg     pH, UA 7.0     Protein, UA neg     Urobilinogen, UA 1.0     Nitrite, UA neg     Leukocytes, UA Negative    [facility] reading (PRIMARY) by  Dr.Kaymen Adrian heart size is normal clear lung fields        Assessment & Plan:  Routine labs and EKG ordered. We'll also do a urine dip. Patient with long runs of bigeminy. Frequent unifocal PVCs. Concern would be the PVC burden has triggered  some degree of failure even though on his echocardiogram he had normal LV function. Will check a BNP and chest x-ray and discuss with his cardiologist to get some guidance regarding his treatment. The patient would prefer to hold off on his diuretics. He would like to take the diltiazem  he had taken before and this was written referral made for a Holter monitor but this will be scheduled by his cardiologist . Terry Vega hold off on diuretics for now

## 2014-04-19 LAB — BRAIN NATRIURETIC PEPTIDE: Brain Natriuretic Peptide: 61 pg/mL (ref 0.0–100.0)

## 2014-04-21 ENCOUNTER — Telehealth: Payer: Self-pay

## 2014-04-21 NOTE — Telephone Encounter (Signed)
Pt is needing to talk with someone about his blood pressure medication

## 2014-04-22 ENCOUNTER — Other Ambulatory Visit: Payer: Self-pay | Admitting: Radiology

## 2014-04-22 MED ORDER — POTASSIUM CHLORIDE ER 10 MEQ PO TBCR: 10.0000 meq | EXTENDED_RELEASE_TABLET | Freq: Every day | ORAL | Status: DC

## 2014-04-22 MED ORDER — FUROSEMIDE 20 MG PO TABS: 20.0000 mg | ORAL_TABLET | Freq: Every day | ORAL | Status: DC

## 2014-04-22 NOTE — Telephone Encounter (Signed)
Will start Lasix 20 mg one a day #30 with 5 refills start potassium 10 mEq one a day #30 with 5 refills. I was hesitant to change his blood pressure medication pending the results of his Holter monitor and cardiac evaluation. He has a trip next week to [place]. I think this would be the safest to do pending the results of his Holter monitor.

## 2014-04-22 NOTE — Telephone Encounter (Signed)
Sent these in for him.

## 2014-04-22 NOTE — Telephone Encounter (Signed)
Discussed with Dr Daub/ patients Rxs sent in for him. See previous phone messages.

## 2014-04-22 NOTE — Telephone Encounter (Signed)
Patient states he wants to get his lab results. Gave him the notes that was documented by Dr. Cleta Alberts.  Told him lab would mail a copy to the cardiologist.  His bp med is causing his ankles to swell and he made need a new medication.  Tactia diltiazem.  Please advise.

## 2014-04-23 ENCOUNTER — Encounter: Payer: Self-pay | Admitting: *Deleted

## 2014-04-23 ENCOUNTER — Encounter (INDEPENDENT_AMBULATORY_CARE_PROVIDER_SITE_OTHER): Payer: Commercial Managed Care - HMO

## 2014-04-23 DIAGNOSIS — R002 Palpitations: Secondary | ICD-10-CM

## 2014-04-23 NOTE — Progress Notes (Signed)
Patient ID: Terry Vega, male   DOB: 1946-09-20, 68 y.o.   MRN: [id number] E-[employer] 24 hour holter monitor applied to patient.

## 2014-04-28 ENCOUNTER — Ambulatory Visit: Payer: Medicare Other | Admitting: Cardiovascular Disease

## 2014-05-06 ENCOUNTER — Ambulatory Visit (INDEPENDENT_AMBULATORY_CARE_PROVIDER_SITE_OTHER): Payer: Commercial Managed Care - HMO | Admitting: Cardiovascular Disease

## 2014-05-06 ENCOUNTER — Encounter: Payer: Self-pay | Admitting: Cardiovascular Disease

## 2014-05-06 VITALS — BP 166/82 | HR 69 | Ht 69.0 in | Wt 208.2 lb

## 2014-05-06 DIAGNOSIS — I493 Ventricular premature depolarization: Secondary | ICD-10-CM

## 2014-05-06 DIAGNOSIS — R609 Edema, unspecified: Secondary | ICD-10-CM

## 2014-05-06 DIAGNOSIS — I10 Essential (primary) hypertension: Secondary | ICD-10-CM

## 2014-05-06 DIAGNOSIS — R600 Localized edema: Secondary | ICD-10-CM | POA: Insufficient documentation

## 2014-05-06 DIAGNOSIS — R002 Palpitations: Secondary | ICD-10-CM

## 2014-05-06 DIAGNOSIS — I4949 Other premature depolarization: Secondary | ICD-10-CM

## 2014-05-06 MED ORDER — FUROSEMIDE 20 MG PO TABS: 20.0000 mg | ORAL_TABLET | Freq: Every day | ORAL | Status: DC

## 2014-05-06 MED ORDER — CARVEDILOL 6.25 MG PO TABS: 6.2500 mg | ORAL_TABLET | Freq: Two times a day (BID) | ORAL | Status: DC

## 2014-05-06 MED ORDER — POTASSIUM CHLORIDE ER 10 MEQ PO TBCR: 10.0000 meq | EXTENDED_RELEASE_TABLET | Freq: Every day | ORAL | Status: DC

## 2014-05-06 NOTE — Assessment & Plan Note (Signed)
Blood pressure is elevated today. Carvedilol was added as outlined above.

## 2014-05-06 NOTE — Patient Instructions (Signed)
Start Carvedilol 6.25 mg twice daily.   Your physician wants you to follow-up in: 6 months.  You will receive a reminder letter in the mail two months in advance. If you don't receive a letter, please call our office to schedule the follow-up appointment.

## 2014-05-06 NOTE — Assessment & Plan Note (Signed)
The patient had frequent PVCs but is overall asymptomatic. EKG today shows no PVCs. He stopped taking diltiazem due to significant lower extremity edema. Thus, I started him on carvedilol 6.25 mg twice daily both for PVCs and hypertension.

## 2014-05-06 NOTE — Progress Notes (Signed)
Primary care physician: Dr. Cleta Albertsaub  HPI  [name] is a pleasant 68 year old male who is here today for a followup visit regarding  PVCs and recent lower extremity edema. He has prolonged history of hypertension which has been reasonably controlled on diltiazem. He has hypothyroidism and has been on replacement therapy for many years. He was seen last year for unifocal PVCs but overall was asymptomatic. He had an echocardiogram done in December of 2014 which showed normal LV systolic function, mildly dilated left atrium and mild aortic regurgitation. Nuclear stress test showed no evidence of ischemia. He was seen recently by Dr. Cleta Albertsaub for worsening lower extremity edema. He was also noted to have more PVCs. He was started on small dose Lasix 20 mg once daily. He had labs performed which overall were unremarkable including normal BNP, normal CBC and normal basic metabolic profile. He stopped taking diltiazem due to concerns about lower extremity edema. [place]. He had no chest pain or shortness of breath. He underwent a 24-hour Holter monitor which showed 8000 PVCs.  No Known Allergies   Current Outpatient Prescriptions on File Prior to Visit  Medication Sig Dispense Refill  . aspirin 81 MG tablet Take 81 mg by mouth daily.      . cholecalciferol (VITAMIN D) 1000 UNITS tablet Take 1,000 Units by mouth daily.      . Cyanocobalamin (VITAMIN B12 PO) Take 2,500 mg by mouth daily.      Marland Kitchen. levothyroxine (SYNTHROID, LEVOTHROID) 88 MCG tablet Take 1 tablet (88 mcg total) by mouth daily.  90 tablet  2  . Multiple Vitamin (MULTIVITAMIN) tablet Take 1 tablet by mouth daily.      . vitamin E 1000 UNIT capsule Take 1,000 Units by mouth daily.       No current facility-administered medications on file prior to visit.     Past Medical History  Diagnosis Date  . Hypertension   . Thyroid disease      Past Surgical History  Procedure Laterality Date  . Vasectomy        Family History  Problem Relation Age of Onset  . Heart disease Mother   . Cancer Father   . Stroke Maternal Grandmother      History   Social History  . Marital Status: Married    Spouse Name: N/A    Number of Children: N/A  . Years of Education: N/A   Occupational History  . Not on file.   Social History Main Topics  . Smoking status: Never Smoker   . Smokeless tobacco: Never Used  . Alcohol Use: Yes  . Drug Use: No  . Sexual Activity: Yes    Birth Control/ Protection: None   Other Topics Concern  . Not on file   Social History Narrative  . No narrative on file     ROS A 10 point review of system was performed. It is negative other than that mentioned in the history of present illness.   PHYSICAL EXAM   BP 166/82  Pulse 69  Ht 5\' 9"  (1.753 m)  Wt 208 lb 4 oz (94.462 kg)  BMI 30.74 kg/m2 Constitutional: He is oriented to person, place, and time. He appears well-developed and well-nourished. No distress.  HENT: No nasal discharge.  Head: Normocephalic and atraumatic.  Eyes: Pupils are equal and round.  No discharge. Neck: Normal range of motion. Neck supple. No JVD present. No thyromegaly present.  Cardiovascular: Normal rate, regular rhythm, normal  heart sounds. Exam reveals no gallop and no friction rub. No murmur heard.  Pulmonary/Chest: Effort normal and breath sounds normal. No stridor. No respiratory distress. He has no wheezes. He has no rales. He exhibits no tenderness.  Abdominal: Soft. Bowel sounds are normal. He exhibits no distension. There is no tenderness. There is no rebound and no guarding.  Musculoskeletal: Normal range of motion. He exhibits trace edema and no tenderness.  Neurological: He is alert and oriented to person, place, and time. Coordination normal.  Skin: Skin is warm and dry. No rash noted. He is not diaphoretic. No erythema. No pallor.  Psychiatric: He has a normal mood and affect. His behavior is normal. Judgment and  thought content normal.  Vascular: Radial pulses are normal, femoral and distal pulses are normal.     WUJ:WJXBJEKG:Sinus  Rhythm  WITHIN NORMAL LIMITS    ASSESSMENT AND PLAN

## 2014-05-06 NOTE — Assessment & Plan Note (Signed)
Likely due to chronic venous insufficiency with a component related to treatment with a calcium channel blocker. This has improved significantly after stopping diltiazem and on a small dose Lasix. BNP was normal. He has no symptoms suggestive of heart failure. Echocardiogram last year showed normal LV systolic function and no evidence of pulmonary hypertension.

## 2014-07-23 ENCOUNTER — Encounter: Payer: Self-pay | Admitting: Internal Medicine

## 2014-08-22 ENCOUNTER — Encounter: Payer: Self-pay | Admitting: *Deleted

## 2014-08-29 ENCOUNTER — Other Ambulatory Visit: Payer: Self-pay

## 2014-12-01 ENCOUNTER — Telehealth: Payer: Self-pay | Admitting: *Deleted

## 2014-12-01 NOTE — Telephone Encounter (Signed)
Called and spoke with patient. He doesn't want to schedule Echo at this point. Has an appt with Dr. Kirke CorinArida on 12/09/14 and will discuss this with the doctor.

## 2014-12-09 ENCOUNTER — Encounter: Payer: Self-pay | Admitting: Cardiovascular Disease

## 2014-12-09 ENCOUNTER — Ambulatory Visit (INDEPENDENT_AMBULATORY_CARE_PROVIDER_SITE_OTHER): Payer: Commercial Managed Care - HMO | Admitting: Cardiovascular Disease

## 2014-12-09 VITALS — BP 128/86 | HR 61 | Ht 69.0 in | Wt 213.0 lb

## 2014-12-09 DIAGNOSIS — I493 Ventricular premature depolarization: Secondary | ICD-10-CM

## 2014-12-09 DIAGNOSIS — R600 Localized edema: Secondary | ICD-10-CM

## 2014-12-09 DIAGNOSIS — I10 Essential (primary) hypertension: Secondary | ICD-10-CM | POA: Diagnosis not present

## 2014-12-09 MED ORDER — CARVEDILOL 6.25 MG PO TABS: 6.2500 mg | ORAL_TABLET | Freq: Two times a day (BID) | ORAL | Status: DC

## 2014-12-09 NOTE — Assessment & Plan Note (Signed)
Improved significantly with carvedilol. He is minimally symptomatic. Continue current treatment.

## 2014-12-09 NOTE — Progress Notes (Signed)
Primary care physician: Dr. Cleta Albertsaub  HPI  [name] is a pleasant 69 year old male who is here today for a followup visit regarding  PVCs and recent lower extremity edema. He has prolonged history of hypertension which has been reasonably controlled on diltiazem. He has hypothyroidism and has been on replacement therapy for many years. He was seen last year for unifocal PVCs but overall was asymptomatic. He had an echocardiogram done in December of 2014 which showed normal LV systolic function, mildly dilated left atrium and mild aortic regurgitation. Nuclear stress test showed no evidence of ischemia. He was seen recently by Dr. Cleta Albertsaub for worsening lower extremity edema. He was also noted to have more PVCs. He was started on small dose Lasix 20 mg once daily. He had labs performed which overall were unremarkable including normal BNP, normal CBC and normal basic metabolic profile. Diltiazem was discontinued due to lower extremity edema. He was switched to carvedilol for both hypertension and PVCs with significant improvement in symptoms. He reports resolution of lower extremity edema.  No Known Allergies   Current Outpatient Prescriptions on File Prior to Visit  Medication Sig Dispense Refill  . aspirin 81 MG tablet Take 81 mg by mouth daily.    . carvedilol (COREG) 6.25 MG tablet Take 1 tablet (6.25 mg total) by mouth 2 (two) times daily. 60 tablet 6  . cholecalciferol (VITAMIN D) 1000 UNITS tablet Take 1,000 Units by mouth daily.    . Cyanocobalamin (VITAMIN B12 PO) Take 2,500 mg by mouth daily.    Marland Kitchen. levothyroxine (SYNTHROID, LEVOTHROID) 88 MCG tablet Take 1 tablet (88 mcg total) by mouth daily. 90 tablet 2  . Multiple Vitamin (MULTIVITAMIN) tablet Take 1 tablet by mouth daily.    . vitamin E 1000 UNIT capsule Take 1,000 Units by mouth daily.     No current facility-administered medications on file prior to visit.     Past Medical History  Diagnosis Date  . Hypertension   . Thyroid  disease      Past Surgical History  Procedure Laterality Date  . Vasectomy       Family History  Problem Relation Age of Onset  . Heart disease Mother   . Cancer Father   . Stroke Maternal Grandmother      History   Social History  . Marital Status: Married    Spouse Name: N/A    Number of Children: N/A  . Years of Education: N/A   Occupational History  . Not on file.   Social History Main Topics  . Smoking status: Never Smoker   . Smokeless tobacco: Never Used  . Alcohol Use: Yes  . Drug Use: No  . Sexual Activity: Yes    Birth Control/ Protection: None   Other Topics Concern  . Not on file   Social History Narrative     ROS A 10 point review of system was performed. It is negative other than that mentioned in the history of present illness.   PHYSICAL EXAM   BP 128/86 mmHg  Pulse 61  Ht 5\' 9"  (1.753 m)  Wt 213 lb (96.616 kg)  BMI 31.44 kg/m2 Constitutional: He is oriented to person, place, and time. He appears well-developed and well-nourished. No distress.  HENT: No nasal discharge.  Head: Normocephalic and atraumatic.  Eyes: Pupils are equal and round.  No discharge. Neck: Normal range of motion. Neck supple. No JVD present. No thyromegaly present.  Cardiovascular: Normal rate, regular rhythm, normal heart sounds. Exam reveals  no gallop and no friction rub. No murmur heard.  Pulmonary/Chest: Effort normal and breath sounds normal. No stridor. No respiratory distress. He has no wheezes. He has no rales. He exhibits no tenderness.  Abdominal: Soft. Bowel sounds are normal. He exhibits no distension. There is no tenderness. There is no rebound and no guarding.  Musculoskeletal: Normal range of motion. He exhibits trace edema and no tenderness.  Neurological: He is alert and oriented to person, place, and time. Coordination normal.  Skin: Skin is warm and dry. No rash noted. He is not diaphoretic. No erythema. No pallor.  Psychiatric: He has a normal  mood and affect. His behavior is normal. Judgment and thought content normal.  Vascular: Radial pulses are normal, femoral and distal pulses are normal.     JXB:JYNWG  Rhythm with a PVC and nonspecific T wave changes    ASSESSMENT AND PLAN

## 2014-12-09 NOTE — Assessment & Plan Note (Signed)
Blood pressure is well controlled 

## 2014-12-09 NOTE — Assessment & Plan Note (Signed)
Resolved after stopping diltiazem.

## 2014-12-09 NOTE — Patient Instructions (Signed)
Continue same medications.   Your physician wants you to follow-up in: 1 year.  You will receive a reminder letter in the mail two months in advance. If you don't receive a letter, please call our office to schedule the follow-up appointment.  

## 2015-01-13 ENCOUNTER — Other Ambulatory Visit: Payer: Self-pay

## 2015-01-13 DIAGNOSIS — E039 Hypothyroidism, unspecified: Secondary | ICD-10-CM

## 2015-01-13 MED ORDER — LEVOTHYROXINE SODIUM 88 MCG PO TABS: 88.0000 ug | ORAL_TABLET | Freq: Every day | ORAL | Status: DC

## 2015-01-13 NOTE — Telephone Encounter (Signed)
Dr. Cleta Albertsaub is this ok to send in? Rx pended.

## 2015-01-13 NOTE — Telephone Encounter (Signed)
Pt requesting Levothroxin refill extension until appt on April 12 with dr Cleta Albertsdaub    Phone [phone]   Pharmacy [facility] [place]

## 2015-02-17 ENCOUNTER — Encounter: Payer: Self-pay | Admitting: Internal Medicine

## 2015-02-24 ENCOUNTER — Ambulatory Visit (INDEPENDENT_AMBULATORY_CARE_PROVIDER_SITE_OTHER): Payer: Commercial Managed Care - HMO | Admitting: Emergency Medicine

## 2015-02-24 ENCOUNTER — Encounter: Payer: Self-pay | Admitting: Emergency Medicine

## 2015-02-24 VITALS — BP 177/75 | HR 65 | Temp 98.0°F | Resp 16 | Ht 66.75 in | Wt 213.0 lb

## 2015-02-24 DIAGNOSIS — Z13220 Encounter for screening for lipoid disorders: Secondary | ICD-10-CM | POA: Diagnosis not present

## 2015-02-24 DIAGNOSIS — I493 Ventricular premature depolarization: Secondary | ICD-10-CM | POA: Diagnosis not present

## 2015-02-24 DIAGNOSIS — Z23 Encounter for immunization: Secondary | ICD-10-CM | POA: Diagnosis not present

## 2015-02-24 DIAGNOSIS — Z125 Encounter for screening for malignant neoplasm of prostate: Secondary | ICD-10-CM | POA: Diagnosis not present

## 2015-02-24 DIAGNOSIS — E039 Hypothyroidism, unspecified: Secondary | ICD-10-CM

## 2015-02-24 DIAGNOSIS — I10 Essential (primary) hypertension: Secondary | ICD-10-CM

## 2015-02-24 DIAGNOSIS — H539 Unspecified visual disturbance: Secondary | ICD-10-CM | POA: Diagnosis not present

## 2015-02-24 DIAGNOSIS — Z1211 Encounter for screening for malignant neoplasm of colon: Secondary | ICD-10-CM | POA: Diagnosis not present

## 2015-02-24 DIAGNOSIS — Z0000 Encounter for general adult medical examination without abnormal findings: Secondary | ICD-10-CM | POA: Diagnosis not present

## 2015-02-24 LAB — COMPLETE METABOLIC PANEL WITH GFR
ALK PHOS: 40 U/L (ref 39–117)
ALT: 14 U/L (ref 0–53)
AST: 19 U/L (ref 0–37)
Albumin: 4 g/dL (ref 3.5–5.2)
BILIRUBIN TOTAL: 0.6 mg/dL (ref 0.2–1.2)
BUN: 12 mg/dL (ref 6–23)
CO2: 31 mEq/L (ref 19–32)
CREATININE: 0.82 mg/dL (ref 0.50–1.35)
Calcium: 9.2 mg/dL (ref 8.4–10.5)
Chloride: 103 mEq/L (ref 96–112)
GFR, Est African American: >89 mL/min
GFR, Est Non African American: >89 mL/min
Glucose, Bld: 93 mg/dL (ref 70–99)
Potassium: 4.5 mEq/L (ref 3.5–5.3)
Sodium: 141 mEq/L (ref 135–145)
Total Protein: 6.9 g/dL (ref 6.0–8.3)

## 2015-02-24 LAB — POCT URINALYSIS DIPSTICK
Bilirubin, UA: NEGATIVE
GLUCOSE UA: NEGATIVE
Ketones, UA: NEGATIVE
Leukocytes, UA: NEGATIVE
Nitrite, UA: NEGATIVE
Protein, UA: NEGATIVE
RBC UA: NEGATIVE
Spec Grav, UA: 1.01
Urobilinogen, UA: 0.2
pH, UA: 7.5

## 2015-02-24 LAB — CBC WITH DIFFERENTIAL/PLATELET
BASOS PCT: 1 % (ref 0–1)
Basophils Absolute: 0.1 10*3/uL (ref 0.0–0.1)
EOS ABS: 0.3 10*3/uL (ref 0.0–0.7)
EOS PCT: 5 % (ref 0–5)
HCT: 41.8 % (ref 39.0–52.0)
Hemoglobin: 14.6 g/dL (ref 13.0–17.0)
Lymphocytes Relative: 28 % (ref 12–46)
Lymphs Abs: 1.5 10*3/uL (ref 0.7–4.0)
MCH: 30.5 pg (ref 26.0–34.0)
MCHC: 34.9 g/dL (ref 30.0–36.0)
MCV: 87.3 fL (ref 78.0–100.0)
MONOS PCT: 8 % (ref 3–12)
MPV: 9.5 fL (ref 8.6–12.4)
Monocytes Absolute: 0.4 10*3/uL (ref 0.1–1.0)
NEUTROS PCT: 58 % (ref 43–77)
Neutro Abs: 3.1 10*3/uL (ref 1.7–7.7)
Platelets: 166 10*3/uL (ref 150–400)
RBC: 4.79 MIL/uL (ref 4.22–5.81)
RDW: 13.1 % (ref 11.5–15.5)
WBC: 5.3 10*3/uL (ref 4.0–10.5)

## 2015-02-24 LAB — LIPID PANEL
Cholesterol: 171 mg/dL (ref 0–200)
HDL: 37 mg/dL — ABNORMAL LOW (ref >=40)
LDL CALC: 116 mg/dL — AB (ref 0–99)
Total CHOL/HDL Ratio: 4.6 Ratio
Triglycerides: 88 mg/dL (ref <150)
VLDL: 18 mg/dL (ref 0–40)

## 2015-02-24 LAB — TSH: TSH: 3.256 u[IU]/mL (ref 0.350–4.500)

## 2015-02-24 LAB — IFOBT (OCCULT BLOOD): IFOBT: POSITIVE

## 2015-02-24 MED ORDER — ZOSTER VACCINE LIVE 19400 UNT/0.65ML ~~LOC~~ SOLR: 0.6500 mL | Freq: Once | SUBCUTANEOUS | Status: DC

## 2015-02-24 MED ORDER — LEVOTHYROXINE SODIUM 88 MCG PO TABS: 88.0000 ug | ORAL_TABLET | Freq: Every day | ORAL | Status: DC

## 2015-02-24 NOTE — Progress Notes (Addendum)
Subjective:  This chart was scribed for Terry ChrisSteven Daub, MD by Terry Vega, Medical Scribe. This patient was seen in Room 23 and the patient's care was started at 10:15 AM.   Patient ID: Terry Vega, male    DOB: 09-21-1946, 69 y.o.   MRN: [id number]  HPI HPI Comments: Terry HarkRussell L Vega is a 69 y.o. male with a history of hypertension who presents to the [facility] for an annual exam.  He states that his heart condition has improved and is no longer experiencing frequent palpitations.    He does not visit a dermatologist frequently.  He denies any ankle swelling, neck pain, or neck stiffness as associated symptoms.     Past Medical History  Diagnosis Date  . Hypertension   . Thyroid disease    Past Surgical History  Procedure Laterality Date  . Vasectomy     Family History  Problem Relation Age of Onset  . Heart disease Mother   . Cancer Father   . Stroke Maternal Grandmother    History   Social History  . Marital Status: Married    Spouse Name: N/A  . Number of Children: N/A  . Years of Education: N/A   Occupational History  . Not on file.   Social History Main Topics  . Smoking status: Never Smoker   . Smokeless tobacco: Never Used  . Alcohol Use: Yes  . Drug Use: No  . Sexual Activity: Yes    Birth Control/ Protection: None   Other Topics Concern  . Not on file   Social History Narrative   No Known Allergies  Review of Systems  Cardiovascular: Negative for palpitations and leg swelling.  Musculoskeletal: Negative for neck pain and neck stiffness.  All other systems reviewed and are negative.     Objective:  Physical Exam  Constitutional: He is oriented to person, place, and time. He appears well-developed and well-nourished.  HENT:  Head: Normocephalic and atraumatic.  Right Ear: Hearing, tympanic membrane, external ear and ear canal normal.  Left Ear: Hearing, tympanic membrane, external ear and ear canal normal.    Mouth/Throat: Oropharynx is clear and moist. No oropharyngeal exudate.  Eyes: Conjunctivae and EOM are normal. Pupils are equal, round, and reactive to light.  Neck: Normal range of motion. Neck supple. No thyromegaly present.  Cardiovascular: Normal rate, normal heart sounds and intact distal pulses.  Exam reveals no gallop and no friction rub.   No murmur heard. Occasional skipped beats.  Pulmonary/Chest: Effort normal and breath sounds normal. No respiratory distress. He has no wheezes. He has no rales.  Abdominal: Soft. There is no tenderness.  Genitourinary: Prostate normal.  No nodularity on prostate exam.  Musculoskeletal: Normal range of motion.  Mild stasis changes but otherwise normal.  Lymphadenopathy:    He has no cervical adenopathy.  Neurological: He is alert and oriented to person, place, and time.  Skin: Skin is warm and dry.  Psychiatric: He has a normal mood and affect. His behavior is normal.  Nursing note and vitals reviewed.    BP 177/75 mmHg  Pulse 65  Temp(Src) 98 F (36.7 C) (Oral)  Resp 16  Ht 5' 6.75" (1.695 m)  Wt 213 lb (96.616 kg)  BMI 33.63 kg/m2  SpO2 96% Assessment & Plan:  Referral made to Dr. Dione Vega for eye evaluation.  Referral made to Dr. Juanda Vega to update for colonoscopy.  Prescription given for shingles vaccine.  He is going to consider whether  he wants a TDAP or not.  Blood pressure elevated today.  He will monitor this at home with his home blood pressure cuff. I personally performed the services described in this documentation, which was scribed in my presence. The recorded information has been reviewed and is accurate. Blood pressure was repeated prior to leaving and was 140/82

## 2015-02-24 NOTE — Addendum Note (Signed)
Addended by: Maryann AlarBURNS, Brookelyn Gaynor M on: 02/24/2015 11:38 AM   Modules accepted: Orders

## 2015-02-24 NOTE — Progress Notes (Deleted)
   Subjective:    Patient ID: Terry Vega, male    DOB: 1946/07/26, 69 y.o.   MRN: [id number]  HPI    Review of Systems  Constitutional: Negative.   HENT: Negative.   Eyes: Negative.   Respiratory: Negative.   Cardiovascular: Positive for palpitations.  Gastrointestinal: Negative.   Endocrine: Negative.   Genitourinary: Negative.   Musculoskeletal: Negative.   Skin: Negative.   Allergic/Immunologic: Negative.   Neurological: Negative.   Hematological: Negative.   Psychiatric/Behavioral: Negative.        Objective:   Physical Exam        Assessment & Plan:

## 2015-02-25 LAB — PSA, MEDICARE: PSA: 0.59 ng/mL (ref <=4.00)

## 2015-03-02 ENCOUNTER — Encounter: Payer: Self-pay | Admitting: Internal Medicine

## 2015-03-31 DIAGNOSIS — H5210 Myopia, unspecified eye: Secondary | ICD-10-CM | POA: Diagnosis not present

## 2015-03-31 DIAGNOSIS — H5203 Hypermetropia, bilateral: Secondary | ICD-10-CM | POA: Diagnosis not present

## 2015-04-21 ENCOUNTER — Ambulatory Visit (AMBULATORY_SURGERY_CENTER): Payer: Self-pay | Admitting: *Deleted

## 2015-04-21 VITALS — Ht 69.0 in | Wt 217.6 lb

## 2015-04-21 DIAGNOSIS — Z1211 Encounter for screening for malignant neoplasm of colon: Secondary | ICD-10-CM

## 2015-04-21 MED ORDER — NA SULFATE-K SULFATE-MG SULF 17.5-3.13-1.6 GM/177ML PO SOLN: ORAL | Status: DC

## 2015-04-21 NOTE — Progress Notes (Signed)
No egg or soy allergy  No anesthesia or intubation problems per pt  No diet medications taken  Registered in EMMI   

## 2015-05-06 ENCOUNTER — Ambulatory Visit (AMBULATORY_SURGERY_CENTER): Payer: Commercial Managed Care - HMO | Admitting: Internal Medicine

## 2015-05-06 ENCOUNTER — Encounter: Payer: Self-pay | Admitting: Internal Medicine

## 2015-05-06 VITALS — BP 123/61 | HR 65 | Temp 97.8°F | Resp 24 | Ht 69.0 in | Wt 217.0 lb

## 2015-05-06 DIAGNOSIS — Z1211 Encounter for screening for malignant neoplasm of colon: Secondary | ICD-10-CM | POA: Diagnosis present

## 2015-05-06 MED ORDER — SODIUM CHLORIDE 0.9 % IV SOLN: 500.0000 mL | INTRAVENOUS | Status: DC

## 2015-05-06 NOTE — Op Note (Signed)
[facility] [address] [place], [address]   COLONOSCOPY PROCEDURE REPORT  PATIENT: Terry Vega, Terry Vega  MR#: [id number] BIRTHDATE: 01/16/1946 , 69  yrs. old GENDER: male ENDOSCOPIST: Hart Carwin, MD REFERRED OE:HOZYYQ Daub, M.D. PROCEDURE DATE:  05/06/2015 PROCEDURE:   Colonoscopy, screening First Screening Colonoscopy - Avg.  risk and is 50 yrs.  old or older - No.  Prior Negative Screening - Now for repeat screening. N/A  History of Adenoma - Now for follow-up colonoscopy & has been > or = to 3 yrs.  N/A  Polyps removed today? No Recommend repeat exam, <10 yrs? No ASA CLASS:   Class II INDICATIONS:Screening for colonic neoplasia, Colorectal Neoplasm Risk Assessment for this procedure is average risk, and prior colonoscopy in November 2008.  Diverticulosis and rectal polyp. MEDICATIONS: Monitored anesthesia care and Propofol 200 mg IV  DESCRIPTION OF PROCEDURE:   After the risks benefits and alternatives of the procedure were thoroughly explained, informed consent was obtained.  The digital rectal exam revealed no abnormalities of the rectum.   The LB [id number]-H190 [id number]  endoscope was introduced through the anus and advanced to the cecum, which was identified by both the appendix and ileocecal valve. No adverse events experienced.   The quality of the prep was good.  (MoviPrep was used)  The instrument was then slowly withdrawn as the colon was fully examined. Estimated blood loss is zero unless otherwise noted in this procedure report.      COLON FINDINGS: There was mild diverticulosis noted in the descending colon with associated muscular hypertrophy.  Retroflexed views revealed no abnormalities. The time to cecum = 10.12 Withdrawal time = 6.05   The scope was withdrawn and the procedure completed. COMPLICATIONS: There were no immediate complications.  ENDOSCOPIC IMPRESSION: There was mild diverticulosis noted in the descending  colon  RECOMMENDATIONS: High fiber diet Recall colonoscopy in 10 years  eSigned:  Hart Carwin, MD 05/06/2015 11:05 AM   cc:

## 2015-05-06 NOTE — Patient Instructions (Signed)
YOU HAD AN ENDOSCOPIC PROCEDURE TODAY AT THE [facility]:   Refer to the procedure report that was given to you for any specific questions about what was found during the examination.  If the procedure report does not answer your questions, please call your gastroenterologist to clarify.  If you requested that your care partner not be given the details of your procedure findings, then the procedure report has been included in a sealed envelope for you to review at your convenience later.  YOU SHOULD EXPECT: Some feelings of bloating in the abdomen. Passage of more gas than usual.  Walking can help get rid of the air that was put into your GI tract during the procedure and reduce the bloating. If you had a lower endoscopy (such as a colonoscopy or flexible sigmoidoscopy) you may notice spotting of blood in your stool or on the toilet paper. If you underwent a bowel prep for your procedure, you may not have a normal bowel movement for a few days.  Please Note:  You might notice some irritation and congestion in your nose or some drainage.  This is from the oxygen used during your procedure.  There is no need for concern and it should clear up in a day or so.  SYMPTOMS TO REPORT IMMEDIATELY:   Following lower endoscopy (colonoscopy or flexible sigmoidoscopy):  Excessive amounts of blood in the stool  Significant tenderness or worsening of abdominal pains  Swelling of the abdomen that is new, acute  Fever of 100F or higher    For urgent or emergent issues, a gastroenterologist can be reached at any hour by calling [phone].   DIET: Your first meal following the procedure should be a small meal and then it is ok to progress to your normal diet. Heavy or fried foods are harder to digest and may make you feel nauseous or bloated.  Likewise, meals heavy in dairy and vegetables can increase bloating.  Drink plenty of fluids but you should avoid alcoholic beverages for 24  hours.  ACTIVITY:  You should plan to take it easy for the rest of today and you should NOT DRIVE or use heavy machinery until tomorrow (because of the sedation medicines used during the test).    FOLLOW UP: Our staff will call the number listed on your records the next business day following your procedure to check on you and address any questions or concerns that you may have regarding the information given to you following your procedure. If we do not reach you, we will leave a message.  However, if you are feeling well and you are not experiencing any problems, there is no need to return our call.  We will assume that you have returned to your regular daily activities without incident.  If any biopsies were taken you will be contacted by phone or by letter within the next 1-3 weeks.  Please call us at [phone] if you have not heard about the biopsies in 3 weeks.    SIGNATURES/CONFIDENTIALITY: You and/or your care partner have signed paperwork which will be entered into your electronic medical record.  These signatures attest to the fact that that the information above on your After Visit Summary has been reviewed and is understood.  Full responsibility of the confidentiality of this discharge information lies with you and/or your care-partner.  Diverticulosis and high fiber diet information given.  Next colonoscopy 10 years-2026. 

## 2015-05-06 NOTE — Progress Notes (Signed)
A/ox3 pleased with MAC, report to Jane RN 

## 2015-05-07 ENCOUNTER — Telehealth: Payer: Self-pay | Admitting: Emergency Medicine

## 2015-05-07 NOTE — Telephone Encounter (Signed)
  Follow up Call-  Call back number 05/06/2015  Post procedure Call Back phone  # [phone]  Permission to leave phone message Yes     Patient questions:  Do you have a fever, pain , or abdominal swelling? No. Pain Score  0 *  Have you tolerated food without any problems? Yes.    Have you been able to return to your normal activities? Yes.    Do you have any questions about your discharge instructions: Diet   No. Medications  No. Follow up visit  No.  Do you have questions or concerns about your Care? No.  Actions: * If pain score is 4 or above: No action needed, pain <4.

## 2015-08-18 ENCOUNTER — Encounter: Payer: Self-pay | Admitting: Emergency Medicine

## 2015-09-27 IMAGING — CR DG CHEST 2V
2 series · 2 of 2 positions shown · non-contrast
Comparison: 10/09/2013

CLINICAL DATA: Hypertension, PVCs, edema

EXAM:
CHEST  2 VIEW

[PA]
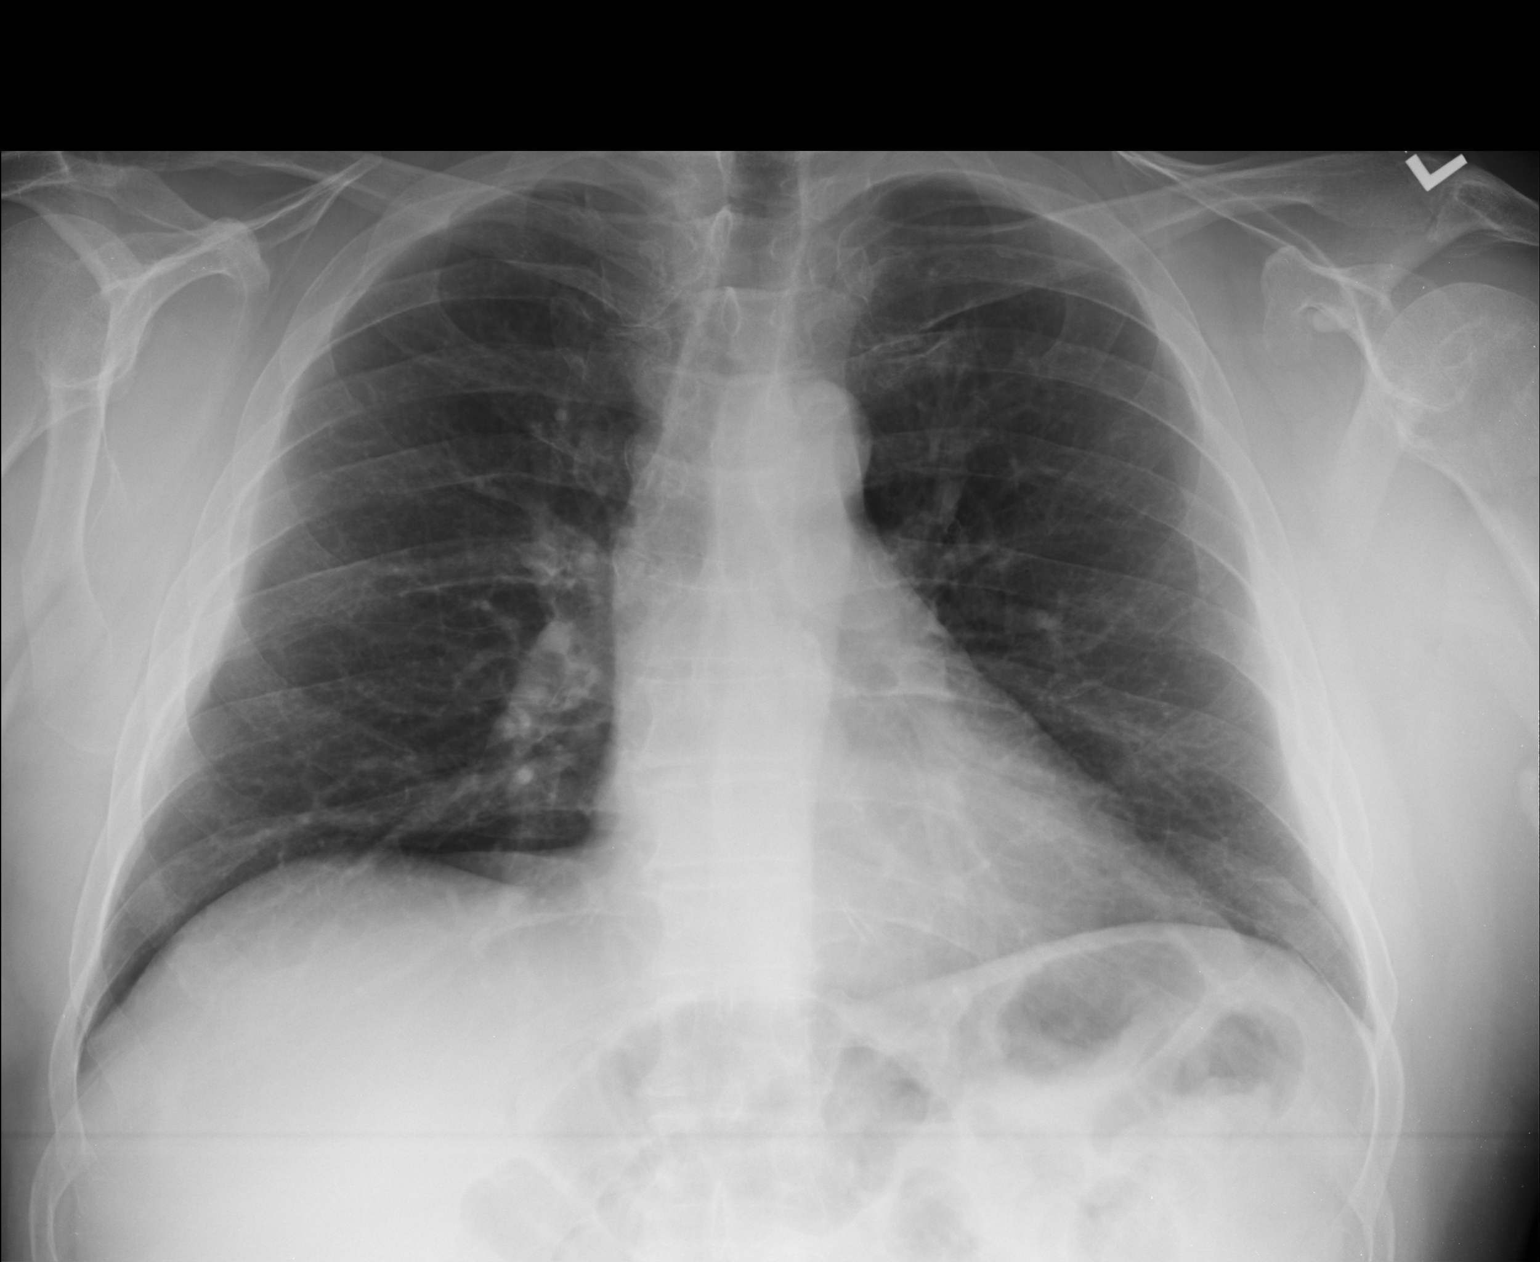

[lateral]
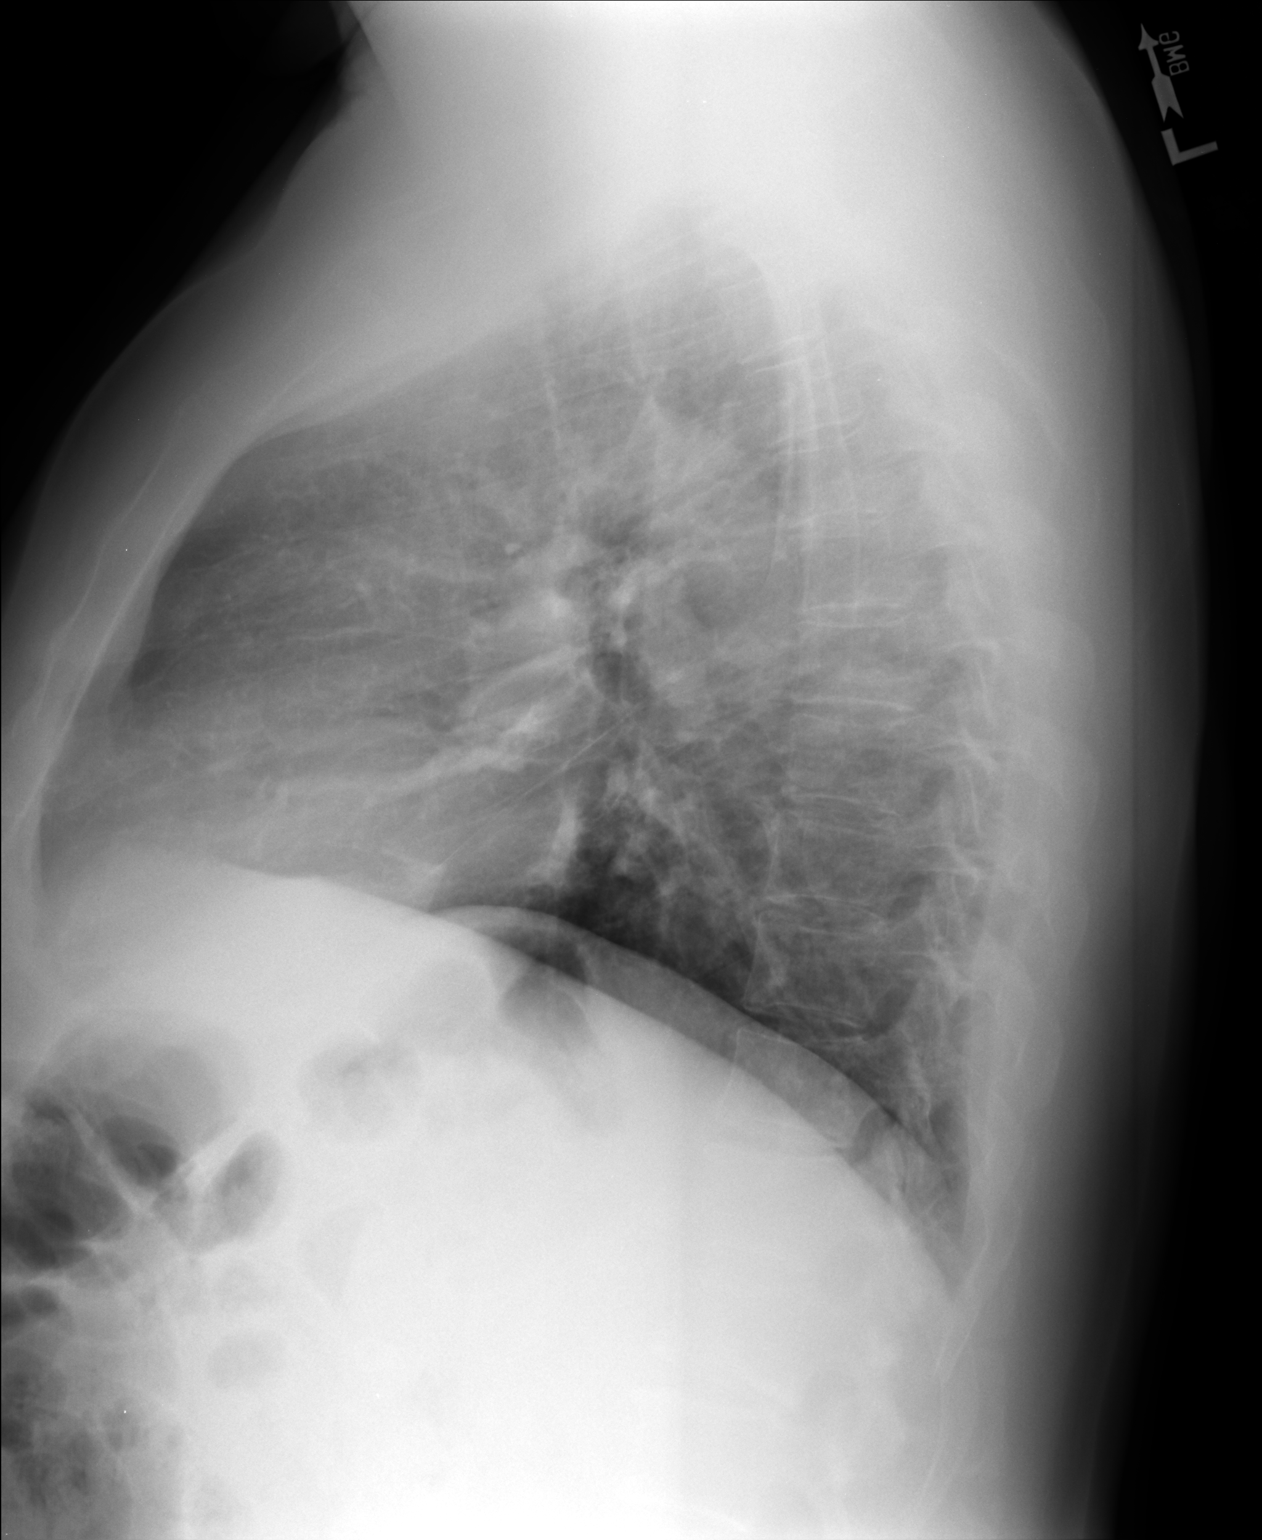

[2 of 2 positions shown; findings below may reference images not displayed]

FINDINGS: Normal heart size and vascularity. Lungs remain clear. No focal
airspace process, collapse or consolidation. Negative for edema,
effusion or pneumothorax. No significant interval change.
IMPRESSION: Stable exam.  No acute chest process.

## 2015-09-29 ENCOUNTER — Other Ambulatory Visit: Payer: Self-pay | Admitting: Cardiovascular Disease

## 2015-11-29 ENCOUNTER — Telehealth: Payer: Self-pay | Admitting: Family Medicine

## 2015-11-29 NOTE — Telephone Encounter (Signed)
lmom to call and reschedule appt with daub 

## 2015-12-31 ENCOUNTER — Ambulatory Visit (INDEPENDENT_AMBULATORY_CARE_PROVIDER_SITE_OTHER): Payer: Commercial Managed Care - HMO | Admitting: Cardiovascular Disease

## 2015-12-31 ENCOUNTER — Encounter: Payer: Self-pay | Admitting: Cardiovascular Disease

## 2015-12-31 VITALS — BP 144/80 | HR 61 | Ht 69.0 in | Wt 211.5 lb

## 2015-12-31 DIAGNOSIS — I10 Essential (primary) hypertension: Secondary | ICD-10-CM

## 2015-12-31 DIAGNOSIS — I493 Ventricular premature depolarization: Secondary | ICD-10-CM | POA: Diagnosis not present

## 2015-12-31 NOTE — Patient Instructions (Signed)
Medication Instructions: Continue same medications.   Labwork: None.   Procedures/Testing: None.   Follow-Up: 1 year follow up with Dr. Stanlee Roehrig.   Any Additional Special Instructions Will Be Listed Below (If Applicable).     If you need a refill on your cardiac medications before your next appointment, please call your pharmacy.   

## 2015-12-31 NOTE — Assessment & Plan Note (Signed)
He is doing well overall. The burden of PVCs seems to be much less than before. He has no symptoms.

## 2015-12-31 NOTE — Progress Notes (Signed)
Primary care physician: Dr. Cleta Alberts  HPI  Terry Vega is a pleasant 70 year old male who is here today for a followup visit regarding  PVCs and  lower extremity edema. He has prolonged history of hypertension. He has hypothyroidism and has been on replacement therapy for many years.  He was seen in 2014 for unifocal PVCs but overall was asymptomatic. He had an echocardiogram done in December of 2014 which showed normal LV systolic function, mildly dilated left atrium and mild aortic regurgitation. Nuclear stress test showed no evidence of ischemia. He had worsening leg edema and diltiazem which improved after switching to carvedilol. He has been doing well with no chest pain, shortness of breath, palpitations or leg edema.  No Known Allergies   Current Outpatient Prescriptions on File Prior to Visit  Medication Sig Dispense Refill  . aspirin 81 MG tablet Take 81 mg by mouth daily.    . carvedilol (COREG) 6.25 MG tablet TAKE 1 TABLET TWICE DAILY 180 tablet 3  . cholecalciferol (VITAMIN D) 1000 UNITS tablet Take 1,000 Units by mouth daily.    . Cyanocobalamin (VITAMIN B12 PO) Take 1,000 mg by mouth 2 (two) times daily.     Marland Kitchen levothyroxine (SYNTHROID, LEVOTHROID) 88 MCG tablet Take 1 tablet (88 mcg total) by mouth daily. 90 tablet 3  . Multiple Vitamin (MULTIVITAMIN) tablet Take 1 tablet by mouth daily.    . vitamin E 1000 UNIT capsule Take 1,000 Units by mouth daily.     No current facility-administered medications on file prior to visit.     Past Medical History  Diagnosis Date  . Hypertension   . Thyroid disease   . Heart murmur   . Allergy      Past Surgical History  Procedure Laterality Date  . Vasectomy    . Colonoscopy    . Hemorrhoid surgery       Family History  Problem Relation Age of Onset  . Heart disease Mother   . Cancer Father   . Stroke Maternal Grandmother   . Colon cancer Neg Hx   . Esophageal cancer Neg Hx   . Rectal cancer Neg Hx   . Stomach cancer Neg  Hx      Social History   Social History  . Marital Status: Married    Spouse Name: N/A  . Number of Children: N/A  . Years of Education: N/A   Occupational History  . Not on file.   Social History Main Topics  . Smoking status: Never Smoker   . Smokeless tobacco: Never Used  . Alcohol Use: Yes     Comment: occasionally  . Drug Use: No  . Sexual Activity: Yes    Birth Control/ Protection: None   Other Topics Concern  . Not on file   Social History Narrative     ROS A 10 point review of system was performed. It is negative other than that mentioned in the history of present illness.   PHYSICAL EXAM   BP 144/80 mmHg  Pulse 61  Ht  (1.753 m)  Wt 211 lb 8 oz (95.936 kg)  BMI 31.22 kg/m2 Constitutional: He is oriented to person, place, and time. He appears well-developed and well-nourished. No distress.  HENT: No nasal discharge.  Head: Normocephalic and atraumatic.  Eyes: Pupils are equal and round.  No discharge. Neck: Normal range of motion. Neck supple. No JVD present. No thyromegaly present.  Cardiovascular: Normal rate, regular rhythm, normal heart sounds. Exam reveals no gallop and  no friction rub. No murmur heard.  Pulmonary/Chest: Effort normal and breath sounds normal. No stridor. No respiratory distress. He has no wheezes. He has no rales. He exhibits no tenderness.  Abdominal: Soft. Bowel sounds are normal. He exhibits no distension. There is no tenderness. There is no rebound and no guarding.  Musculoskeletal: Normal range of motion. He exhibits trace edema and no tenderness.  Neurological: He is alert and oriented to person, place, and time. Coordination normal.  Skin: Skin is warm and dry. No rash noted. He is not diaphoretic. No erythema. No pallor.  Psychiatric: He has a normal mood and affect. His behavior is normal. Judgment and thought content normal.  Vascular: Radial pulses are normal, femoral and distal pulses are normal.     EKG:  Normal sinus rhythm with no PVCs. No significant ST or T wave changes.    ASSESSMENT AND PLAN

## 2015-12-31 NOTE — Assessment & Plan Note (Signed)
Blood pressure is reasonably controlled on current dose of carvedilol.

## 2016-03-01 ENCOUNTER — Encounter: Payer: Commercial Managed Care - HMO | Admitting: Emergency Medicine

## 2016-03-17 ENCOUNTER — Encounter: Payer: Self-pay | Admitting: Emergency Medicine

## 2016-03-17 ENCOUNTER — Ambulatory Visit (INDEPENDENT_AMBULATORY_CARE_PROVIDER_SITE_OTHER): Payer: Commercial Managed Care - HMO | Admitting: Emergency Medicine

## 2016-03-17 VITALS — BP 154/80 | HR 68 | Temp 98.2°F | Resp 16 | Ht 66.5 in | Wt 215.2 lb

## 2016-03-17 DIAGNOSIS — I493 Ventricular premature depolarization: Secondary | ICD-10-CM

## 2016-03-17 DIAGNOSIS — Z125 Encounter for screening for malignant neoplasm of prostate: Secondary | ICD-10-CM | POA: Diagnosis not present

## 2016-03-17 DIAGNOSIS — I10 Essential (primary) hypertension: Secondary | ICD-10-CM

## 2016-03-17 DIAGNOSIS — E039 Hypothyroidism, unspecified: Secondary | ICD-10-CM

## 2016-03-17 DIAGNOSIS — Z0000 Encounter for general adult medical examination without abnormal findings: Secondary | ICD-10-CM

## 2016-03-17 DIAGNOSIS — Z13220 Encounter for screening for lipoid disorders: Secondary | ICD-10-CM

## 2016-03-17 DIAGNOSIS — Z1159 Encounter for screening for other viral diseases: Secondary | ICD-10-CM | POA: Diagnosis not present

## 2016-03-17 LAB — COMPLETE METABOLIC PANEL WITH GFR
ALT: 16 U/L (ref 9–46)
AST: 22 U/L (ref 10–35)
Albumin: 3.8 g/dL (ref 3.6–5.1)
Alkaline Phosphatase: 42 U/L (ref 40–115)
BUN: 17 mg/dL (ref 7–25)
CALCIUM: 9.2 mg/dL (ref 8.6–10.3)
CHLORIDE: 102 mmol/L (ref 98–110)
CO2: 28 mmol/L (ref 20–31)
Creat: 0.96 mg/dL (ref 0.70–1.18)
GFR, Est Non African American: 80 mL/min (ref >=60)
Glucose, Bld: 94 mg/dL (ref 65–99)
POTASSIUM: 3.9 mmol/L (ref 3.5–5.3)
Sodium: 140 mmol/L (ref 135–146)
Total Bilirubin: 0.6 mg/dL (ref 0.2–1.2)
Total Protein: 6.7 g/dL (ref 6.1–8.1)

## 2016-03-17 LAB — CBC WITH DIFFERENTIAL/PLATELET
BASOS PCT: 1 %
Basophils Absolute: 57 cells/uL (ref 0–200)
Eosinophils Absolute: 456 cells/uL (ref 15–500)
Eosinophils Relative: 8 %
HEMATOCRIT: 40.6 % (ref 38.5–50.0)
Hemoglobin: 13.8 g/dL (ref 13.2–17.1)
LYMPHS PCT: 30 %
Lymphs Abs: 1710 cells/uL (ref 850–3900)
MCH: 30.9 pg (ref 27.0–33.0)
MCHC: 34 g/dL (ref 32.0–36.0)
MCV: 90.8 fL (ref 80.0–100.0)
MPV: 9.7 fL (ref 7.5–12.5)
Monocytes Absolute: 570 cells/uL (ref 200–950)
Monocytes Relative: 10 %
NEUTROS ABS: 2907 {cells}/uL (ref 1500–7800)
NEUTROS PCT: 51 %
Platelets: 171 10*3/uL (ref 140–400)
RBC: 4.47 MIL/uL (ref 4.20–5.80)
RDW: 13.4 % (ref 11.0–15.0)
WBC: 5.7 10*3/uL (ref 3.8–10.8)

## 2016-03-17 LAB — THYROID PANEL WITH TSH
Free Thyroxine Index: 2.2 (ref 1.4–3.8)
T3 UPTAKE: 30 % (ref 22–35)
T4 TOTAL: 7.2 ug/dL (ref 4.5–12.0)
TSH: 5.89 m[IU]/L — AB (ref 0.40–4.50)

## 2016-03-17 LAB — LIPID PANEL
CHOL/HDL RATIO: 4.2 ratio (ref <=5.0)
CHOLESTEROL: 159 mg/dL (ref 125–200)
HDL: 38 mg/dL — ABNORMAL LOW (ref >=40)
LDL Cholesterol: 102 mg/dL (ref <130)
Triglycerides: 96 mg/dL (ref <150)
VLDL: 19 mg/dL (ref <30)

## 2016-03-17 LAB — POCT URINALYSIS DIP (MANUAL ENTRY)
BILIRUBIN UA: NEGATIVE
Bilirubin, UA: NEGATIVE
Blood, UA: NEGATIVE
Glucose, UA: NEGATIVE
LEUKOCYTES UA: NEGATIVE
Nitrite, UA: NEGATIVE
PROTEIN UA: NEGATIVE
Spec Grav, UA: 1.02
UROBILINOGEN UA: 0.2
pH, UA: 6.5

## 2016-03-17 LAB — POC MICROSCOPIC URINALYSIS (UMFC): MUCUS RE: ABSENT

## 2016-03-17 LAB — HEPATITIS C ANTIBODY: HCV Ab: NEGATIVE

## 2016-03-17 MED ORDER — LEVOTHYROXINE SODIUM 88 MCG PO TABS: 88.0000 ug | ORAL_TABLET | Freq: Every day | ORAL | Status: DC

## 2016-03-17 MED ORDER — ZOSTER VACCINE LIVE 19400 UNT/0.65ML ~~LOC~~ SUSR: 0.6500 mL | Freq: Once | SUBCUTANEOUS | Status: DC

## 2016-03-17 MED ORDER — LEVOTHYROXINE SODIUM 100 MCG PO TABS: 100.0000 ug | ORAL_TABLET | Freq: Every day | ORAL | Status: DC

## 2016-03-17 NOTE — Progress Notes (Signed)
By signing my name below, I, Terry Vega, attest that this documentation has been prepared under the direction and in the presence of Terry Queen, MD. Electronically Signed: Moises Vega, [place]. 03/17/2016 , 1:17 PM .  Patient was seen in room 21 .  Chief Complaint:  Chief Complaint  Patient presents with  . Annual Exam  . Medication Refill    HPI: Terry Vega is a 70 y.o. male who reports to [facility] today for annual physical.  He's generally feeling well. He denies any new skin changes or growth.   Family His daughter is teaching at [employer].   Past Medical History  Diagnosis Date  . Hypertension   . Thyroid disease   . Heart murmur   . Allergy    Past Surgical History  Procedure Laterality Date  . Vasectomy    . Colonoscopy    . Hemorrhoid surgery     Social History   Social History  . Marital Status: Married    Spouse Name: N/A  . Number of Children: N/A  . Years of Education: N/A   Social History Main Topics  . Smoking status: Never Smoker   . Smokeless tobacco: Never Used  . Alcohol Use: Yes     Comment: occasionally  . Drug Use: No  . Sexual Activity: Yes    Birth Control/ Protection: None   Other Topics Concern  . Not on file   Social History Narrative   Family History  Problem Relation Age of Onset  . Heart disease Mother   . Cancer Father   . Stroke Maternal Grandmother   . Colon cancer Neg Hx   . Esophageal cancer Neg Hx   . Rectal cancer Neg Hx   . Stomach cancer Neg Hx    No Known Allergies Prior to Admission medications   Medication Sig Start Date End Date Taking? Authorizing Provider  aspirin 81 MG tablet Take 81 mg by mouth daily.   Yes Historical Provider, MD  carvedilol (COREG) 6.25 MG tablet TAKE 1 TABLET TWICE DAILY 09/30/15  Yes Wellington Hampshire, MD  cholecalciferol (VITAMIN D) 1000 UNITS tablet Take 1,000 Units by mouth daily.   Yes Historical Provider, MD  Cyanocobalamin (VITAMIN B12 PO) Take 1,000 mg  by mouth 2 (two) times daily.    Yes Historical Provider, MD  levothyroxine (SYNTHROID, LEVOTHROID) 88 MCG tablet Take 1 tablet (88 mcg total) by mouth daily. 02/24/15  Yes Darlyne Russian, MD  loratadine (CLARITIN) 10 MG tablet Take 10 mg by mouth daily.   Yes Historical Provider, MD  Multiple Vitamin (MULTIVITAMIN) tablet Take 1 tablet by mouth daily.   Yes Historical Provider, MD  Naproxen Sodium (ALEVE PO) Take by mouth as needed.   Yes Historical Provider, MD  vitamin E 1000 UNIT capsule Take 1,000 Units by mouth daily.   Yes Historical Provider, MD     ROS:  Constitutional: negative for fever, chills, night sweats, weight changes, or fatigue  HEENT: negative for vision changes, hearing loss, congestion, rhinorrhea, ST, epistaxis, or sinus pressure Cardiovascular: negative for chest pain or palpitations Respiratory: negative for hemoptysis, wheezing, shortness of breath, or cough Abdominal: negative for abdominal pain, nausea, vomiting, diarrhea, or constipation Dermatological: negative for rash Neurologic: negative for headache, dizziness, or syncope All other systems reviewed and are otherwise negative with the exception to those above and in the HPI.  PHYSICAL EXAM: Filed Vitals:   03/17/16 1257 03/17/16 1259  BP: 150/92 154/80  Pulse:  68   Temp: 98.2 F (36.8 C)   Resp: 16    Body mass index is 34.22 kg/(m^2).   General: Alert, no acute distress HEENT:  Normocephalic, atraumatic, oropharynx patent. Eye: Terry Vega [facility] Cardiovascular:  Regular rate and rhythm, no rubs murmurs or gallops.  No Carotid bruits, radial pulse intact. No pedal edema.  Respiratory: Clear to auscultation bilaterally.  No wheezes, rales, or rhonchi.  No cyanosis, no use of accessory musculature Abdominal: No organomegaly, abdomen is soft and non-tender, positive bowel sounds. No masses. Musculoskeletal: Gait intact. No edema, tenderness Skin: No rashes. Neurologic: Facial musculature  symmetric. Psychiatric: Patient acts appropriately throughout our interaction.  Lymphatic: No cervical or submandibular lymphadenopathy Genitourinary/Anorectal: No acute findings   LABS: Results for orders placed or performed in visit on 03/17/16  CBC with Differential/Platelet  Result Value Ref Range   WBC 5.7 3.8 - 10.8 K/uL   RBC 4.47 4.20 - 5.80 MIL/uL   Hemoglobin 13.8 13.2 - 17.1 g/dL   HCT 40.6 38.5 - 50.0 %   MCV 90.8 80.0 - 100.0 fL   MCH 30.9 27.0 - 33.0 pg   MCHC 34.0 32.0 - 36.0 g/dL   RDW 13.4 11.0 - 15.0 %   Platelets 171 140 - 400 K/uL   MPV 9.7 7.5 - 12.5 fL   Neutro Abs 2907 1500 - 7800 cells/uL   Lymphs Abs 1710 850 - 3900 cells/uL   Monocytes Absolute 570 200 - 950 cells/uL   Eosinophils Absolute 456 15 - 500 cells/uL   Basophils Absolute 57 0 - 200 cells/uL   Neutrophils Relative % 51 %   Lymphocytes Relative 30 %   Monocytes Relative 10 %   Eosinophils Relative 8 %   Basophils Relative 1 %   Smear Review Criteria for review not met   Hepatitis C antibody  Result Value Ref Range   HCV Ab NEGATIVE NEGATIVE  PSA, Medicare  Result Value Ref Range   PSA  <=4.00 ng/mL  Lipid panel  Result Value Ref Range   Cholesterol 159 125 - 200 mg/dL   Triglycerides 96 <150 mg/dL   HDL 38 (L) >=40 mg/dL   Total CHOL/HDL Ratio 4.2 <=5.0 Ratio   VLDL 19 <30 mg/dL   LDL Cholesterol 102 <130 mg/dL  COMPLETE METABOLIC PANEL WITH GFR  Result Value Ref Range   Sodium 140 135 - 146 mmol/L   Potassium 3.9 3.5 - 5.3 mmol/L   Chloride 102 98 - 110 mmol/L   CO2 28 20 - 31 mmol/L   Glucose, Bld 94 65 - 99 mg/dL   BUN 17 7 - 25 mg/dL   Creat 0.96 0.70 - 1.18 mg/dL   Total Bilirubin 0.6 0.2 - 1.2 mg/dL   Alkaline Phosphatase 42 40 - 115 U/L   AST 22 10 - 35 U/L   ALT 16 9 - 46 U/L   Total Protein 6.7 6.1 - 8.1 g/dL   Albumin 3.8 3.6 - 5.1 g/dL   Calcium 9.2 8.6 - 10.3 mg/dL   GFR, Est African American >89 >=60 mL/min   GFR, Est Non African American 80 >=60 mL/min   Thyroid Panel With TSH  Result Value Ref Range   T4, Total 7.2 4.5 - 12.0 ug/dL   T3 Uptake 30 22 - 35 %   Free Thyroxine Index 2.2 1.4 - 3.8   TSH 5.89 (H) 0.40 - 4.50 mIU/L  POCT urinalysis dipstick  Result Value Ref Range   Color, UA yellow yellow  Clarity, UA clear clear   Glucose, UA negative negative   Bilirubin, UA negative negative   Ketones, POC UA negative negative   Spec Grav, UA 1.020    Vega, UA negative negative   pH, UA 6.5    Protein Ur, POC negative negative   Urobilinogen, UA 0.2    Nitrite, UA Negative Negative   Leukocytes, UA Negative Negative  POCT Microscopic Urinalysis ([facility])  Result Value Ref Range   WBC,UR,HPF,POC None None WBC/hpf   RBC,UR,HPF,POC None None RBC/hpf   Bacteria None None, Too numerous to count   Mucus Absent Absent   Epithelial Cells, UR Per Microscopy None None, Too numerous to count cells/hpf    EKG/XRAY:     ASSESSMENT/PLAN: Patient is in excellent health. I had no changes in his medications. He will continue yearly physical exams.I personally performed the services described in this documentation, which was scribed in my presence. The recorded information has been reviewed and is accurate.TSH was elevated. I did increased his Synthroid dose to 100 g. This should be repeated in 3-4 months to be sure this is the appropriate dose.   Gross sideeffects, risk and benefits, and alternatives of medications d/w patient. Patient is aware that all medications have potential sideeffects and we are unable to predict every sideeffect or drug-drug interaction that may occur.  Terry Queen MD 03/17/2016 1:17 PM

## 2016-03-17 NOTE — Patient Instructions (Signed)

## 2016-03-18 LAB — PSA, MEDICARE: PSA: 0.57 ng/mL (ref <=4.00)

## 2016-03-19 ENCOUNTER — Other Ambulatory Visit: Payer: Self-pay | Admitting: Emergency Medicine

## 2016-03-19 DIAGNOSIS — E039 Hypothyroidism, unspecified: Secondary | ICD-10-CM

## 2016-06-14 ENCOUNTER — Telehealth: Payer: Self-pay

## 2016-06-14 NOTE — Telephone Encounter (Signed)
Attempted to call pt, left VM for pt to call back asap  

## 2016-06-14 NOTE — Telephone Encounter (Signed)
Pt called back and informed him that he needs to come in to be evaluated in order to receive a referral

## 2016-06-14 NOTE — Telephone Encounter (Signed)
Patient request a referral to see a ophthalmologist. Patient state he have something in his right eye. [phone].

## 2016-06-17 DIAGNOSIS — H5210 Myopia, unspecified eye: Secondary | ICD-10-CM | POA: Diagnosis not present

## 2016-06-17 DIAGNOSIS — H5203 Hypermetropia, bilateral: Secondary | ICD-10-CM | POA: Diagnosis not present

## 2016-10-01 ENCOUNTER — Other Ambulatory Visit: Payer: Self-pay | Admitting: Cardiovascular Disease

## 2016-12-30 ENCOUNTER — Ambulatory Visit (INDEPENDENT_AMBULATORY_CARE_PROVIDER_SITE_OTHER): Payer: Medicare HMO | Admitting: Cardiovascular Disease

## 2016-12-30 ENCOUNTER — Encounter: Payer: Self-pay | Admitting: Cardiovascular Disease

## 2016-12-30 VITALS — BP 152/90 | HR 59 | Ht 69.0 in | Wt 217.2 lb

## 2016-12-30 DIAGNOSIS — I493 Ventricular premature depolarization: Secondary | ICD-10-CM | POA: Diagnosis not present

## 2016-12-30 DIAGNOSIS — I10 Essential (primary) hypertension: Secondary | ICD-10-CM | POA: Diagnosis not present

## 2016-12-30 MED ORDER — LOSARTAN POTASSIUM 50 MG PO TABS: 50.0000 mg | ORAL_TABLET | Freq: Every day | ORAL | 3 refills | Status: DC

## 2016-12-30 MED ORDER — LOSARTAN POTASSIUM 50 MG PO TABS: 50.0000 mg | ORAL_TABLET | Freq: Every day | ORAL | 0 refills | Status: DC

## 2016-12-30 MED ORDER — CARVEDILOL 6.25 MG PO TABS: 6.2500 mg | ORAL_TABLET | Freq: Two times a day (BID) | ORAL | 2 refills | Status: DC

## 2016-12-30 MED ORDER — LOSARTAN POTASSIUM 50 MG PO TABS: 50.0000 mg | ORAL_TABLET | Freq: Every day | ORAL | 11 refills | Status: DC

## 2016-12-30 NOTE — Patient Instructions (Signed)
Medication Instructions:  Your physician has recommended you make the following change in your medication:  START taking losartan 50mg  once daily   Labwork: BMP in one week  Testing/Procedures: none  Follow-Up: Your physician wants you to follow-up in: one year with Dr. Kirke CorinArida.  You will receive a reminder letter in the mail two months in advance. If you don't receive a letter, please call our office to schedule the follow-up appointment.   Any Other Special Instructions Will Be Listed Below (If Applicable).     If you need a refill on your cardiac medications before your next appointment, please call your pharmacy.

## 2016-12-30 NOTE — Progress Notes (Signed)
Cardiology Office Note   Date:  12/30/2016   ID:  Terry Vega, DOB 12/05/1945, MRN [id number]  PCP:  Shade Flood, MD  Cardiologist:   Lorine Bears, MD   Chief Complaint  Patient presents with  . other    12 month follow up. Meds reviewed by the pt. verbally. Pt. c/o elevated BP for the past 4 months.       History of Present Illness: Terry Vega is a 71 y.o. male who presents for a followup visit regarding  PVCs and  lower extremity edema. He has prolonged history of hypertension. He has hypothyroidism and has been on replacement therapy for many years.  He was seen in 2014 for unifocal PVCs. He had an echocardiogram done in December of 2014 which showed normal LV systolic function, mildly dilated left atrium and mild aortic regurgitation. Nuclear stress test showed no evidence of ischemia. He had worsening leg edema and diltiazem which improved after switching to carvedilol. He has been doing well with no chest pain, shortness of breath, palpitations or leg edema. His blood pressure has been gradually increasing over the last year and he has noticed the readings to be uncontrolled especially over the last 3-4 months. His systolic blood pressure is ranging from 140-150 mmHg.   Past Medical History:  Diagnosis Date  . Allergy   . Heart murmur   . Hypertension   . Thyroid disease     Past Surgical History:  Procedure Laterality Date  . COLONOSCOPY    . HEMORRHOID SURGERY    . VASECTOMY       Current Outpatient Prescriptions  Medication Sig Dispense Refill  . aspirin 81 MG tablet Take 81 mg by mouth daily.    . carvedilol (COREG) 6.25 MG tablet TAKE 1 TABLET TWICE DAILY 180 tablet 0  . cholecalciferol (VITAMIN D) 1000 UNITS tablet Take 1,000 Units by mouth daily.    . Cyanocobalamin (VITAMIN B12 PO) Take 1,000 mg by mouth 2 (two) times daily.     Marland Kitchen levothyroxine (SYNTHROID, LEVOTHROID) 100 MCG tablet Take 1 tablet (100 mcg total) by mouth daily. 90  tablet 3  . loratadine (CLARITIN) 10 MG tablet Take 10 mg by mouth daily.    . Multiple Vitamin (MULTIVITAMIN) tablet Take 1 tablet by mouth daily.    . Naproxen Sodium (ALEVE PO) Take by mouth as needed.    . vitamin E 1000 UNIT capsule Take 1,000 Units by mouth daily.    Marland Kitchen Zoster Vaccine Live, PF, (ZOSTAVAX) 40981 UNT/0.65ML injection Inject 19,400 Units into the skin once. 1 each 0   No current facility-administered medications for this visit.     Allergies:   Patient has no known allergies.    Social History:  The patient  reports that he has never smoked. He has never used smokeless tobacco. He reports that he drinks alcohol. He reports that he does not use drugs.   Family History:  The patient's family history includes Cancer in his father; Heart disease in his mother; Stroke in his maternal grandmother.    ROS:  Please see the history of present illness.   Otherwise, review of systems are positive for none.   All other systems are reviewed and negative.    PHYSICAL EXAM: VS:  BP (!) 152/90 (BP Location: Left Arm, Patient Position: Sitting, Cuff Size: Normal)   Pulse (!) 59   Ht 5\' 9"  (1.753 m)   Wt 217 lb 4 oz (98.5 kg)  BMI 32.08 kg/m  , BMI Body mass index is 32.08 kg/m. GEN: Well nourished, well developed, in no acute distress  HEENT: normal  Neck: no JVD, carotid bruits, or masses Cardiac: RRR; no murmurs, rubs, or gallops,no edema  Respiratory:  clear to auscultation bilaterally, normal work of breathing GI: soft, nontender, nondistended, + BS MS: no deformity or atrophy  Skin: warm and dry, no rash Neuro:  Strength and sensation are intact Psych: euthymic mood, full affect   EKG:  EKG is ordered today. The ekg ordered today demonstrates normal sinus rhythm with minimal LVH.   Recent Labs: 03/17/2016: ALT 16; BUN 17; Creat 0.96; Hemoglobin 13.8; Platelets 171; Potassium 3.9; Sodium 140; TSH 5.89    Lipid Panel    Component Value Date/Time   CHOL 159  03/17/2016 0814   TRIG 96 03/17/2016 0814   HDL 38 (L) 03/17/2016 0814   CHOLHDL 4.2 03/17/2016 0814   VLDL 19 03/17/2016 0814   LDLCALC 102 03/17/2016 0814      Wt Readings from Last 3 Encounters:  12/30/16 217 lb 4 oz (98.5 kg)  03/17/16 215 lb 3.2 oz (97.6 kg)  12/31/15 211 lb 8 oz (95.9 kg)     No flowsheet data found.    ASSESSMENT AND PLAN:  1.  PVCs: He is not having any palpitations. No PVCs on EKG or by physical exam. Continue treatment with carvedilol.  2. Essential hypertension: His blood pressure has been gradually increasing over the last year. He gained 6 pounds since last year which might be contributing. I discussed with him the importance of diet, exercise and attempted weight loss. In the meanwhile, I elected to add losartan 50 mg once daily. Check basic metabolic profile in one week.   Disposition:   FU with me in 1 year  Signed,  Lorine BearsMuhammad Kiara Keep, MD  12/30/2016 11:37 AM    [facility]

## 2017-01-06 ENCOUNTER — Other Ambulatory Visit
Admission: RE | Admit: 2017-01-06 | Discharge: 2017-01-06 | Disposition: A | Discharge status: Home / Self Care | Payer: Medicare HMO | Source: Ambulatory Visit | Attending: Cardiovascular Disease | Admitting: Cardiovascular Disease

## 2017-01-06 DIAGNOSIS — I10 Essential (primary) hypertension: Secondary | ICD-10-CM | POA: Diagnosis not present

## 2017-01-06 LAB — BASIC METABOLIC PANEL
ANION GAP: 6 (ref 5–15)
BUN: 17 mg/dL (ref 6–20)
CHLORIDE: 103 mmol/L (ref 101–111)
CO2: 29 mmol/L (ref 22–32)
CREATININE: 0.91 mg/dL (ref 0.61–1.24)
Calcium: 9.1 mg/dL (ref 8.9–10.3)
GLUCOSE: 92 mg/dL (ref 65–99)
POTASSIUM: 4 mmol/L (ref 3.5–5.1)
Sodium: 138 mmol/L (ref 135–145)

## 2017-03-27 ENCOUNTER — Telehealth: Payer: Self-pay | Admitting: Family Medicine

## 2017-03-27 ENCOUNTER — Telehealth: Payer: Self-pay | Admitting: Cardiovascular Disease

## 2017-03-27 ENCOUNTER — Other Ambulatory Visit: Payer: Self-pay

## 2017-03-27 MED ORDER — LEVOTHYROXINE SODIUM 100 MCG PO TABS: 100.0000 ug | ORAL_TABLET | Freq: Every day | ORAL | 0 refills | Status: DC

## 2017-03-27 MED ORDER — LOSARTAN POTASSIUM 100 MG PO TABS: 100.0000 mg | ORAL_TABLET | Freq: Every day | ORAL | 3 refills | Status: DC

## 2017-03-27 NOTE — Telephone Encounter (Signed)
[name], [name], MD  Satira SarkByrum, Erika W, RN; Shon BatonYow, [name], RN        Increase Losartan to 100 mg once daily. If BP does not come down to target, then the next step would be to add small dose HCTZ.    S/w pt regarding losartan recommendations. Pt verbalized understanding and is agreeable w/plan. Medication list updated. Refill sent to [facility].

## 2017-03-27 NOTE — Telephone Encounter (Signed)
PT HAS AN APPOINTMENT WITH DR [name] IN JUN BUT HE NEED A REFILL ON LEVOTHYROXINE PLEASE SEND THROUGH MAIL ORDER

## 2017-04-05 ENCOUNTER — Telehealth: Payer: Self-pay | Admitting: Cardiovascular Disease

## 2017-04-05 ENCOUNTER — Other Ambulatory Visit: Payer: Self-pay

## 2017-04-05 DIAGNOSIS — I10 Essential (primary) hypertension: Secondary | ICD-10-CM

## 2017-04-05 MED ORDER — LOSARTAN POTASSIUM-HCTZ 100-25 MG PO TABS: 1.0000 | ORAL_TABLET | Freq: Every day | ORAL | 5 refills | Status: DC

## 2017-04-05 NOTE — Telephone Encounter (Signed)
Pt reports continued elevated BP along w/LE edema.  Losartan increased to 100mg  May 14.  [name], [name], MD  Shon BatonYow, Sharon H, RN        Switch Losartan to Losartan-HCTZ 100-25 mg once daily. Check BMP in 1 week.   Reviewed recommendations w/pt who is agreeable w/plan. [facility].  [facility] [place]. Lab order placed.

## 2017-04-13 ENCOUNTER — Telehealth: Payer: Self-pay | Admitting: Cardiovascular Disease

## 2017-04-13 ENCOUNTER — Other Ambulatory Visit
Admission: RE | Admit: 2017-04-13 | Discharge: 2017-04-13 | Disposition: A | Discharge status: Home / Self Care | Payer: Medicare HMO | Source: Ambulatory Visit | Attending: Cardiovascular Disease | Admitting: Cardiovascular Disease

## 2017-04-13 DIAGNOSIS — I10 Essential (primary) hypertension: Secondary | ICD-10-CM | POA: Diagnosis not present

## 2017-04-13 LAB — BASIC METABOLIC PANEL
Anion gap: 7 (ref 5–15)
BUN: 14 mg/dL (ref 6–20)
CO2: 31 mmol/L (ref 22–32)
Calcium: 9.5 mg/dL (ref 8.9–10.3)
Chloride: 101 mmol/L (ref 101–111)
Creatinine, Ser: 0.86 mg/dL (ref 0.61–1.24)
GFR calc Af Amer: >60 mL/min (ref >60)
GFR calc non Af Amer: >60 mL/min (ref >60)
Glucose, Bld: 97 mg/dL (ref 65–99)
Potassium: 3.8 mmol/L (ref 3.5–5.1)
Sodium: 139 mmol/L (ref 135–145)

## 2017-04-13 NOTE — Telephone Encounter (Signed)
[facility], he is seeing PCP on 6/7 and wants to know if he can do his labwork then. Pt also has a question regarding his Losartan. States his swelling in coming back in his ankles and both feet, and his BP is increasing. 131/77, today 145/82. Please call.

## 2017-04-13 NOTE — Telephone Encounter (Signed)
Losartan is not known to cause leg edema. Losartan-HCTZ does not come in 50-25 mg tablet so if he wants to decrease Losartan to 50 mg, we will have to give 2 separate tablets.  He can try leg elevation and low Sodium diet to see if leg edema improves.

## 2017-04-13 NOTE — Telephone Encounter (Signed)
Returned call to patient. Patient complains of increased swelling in both ankle and feet. On 03/27/17, swelling started when losartan increased to 100 mg daily for increased BP. On 04/05/17, medication changed to losartan/HCTZ 100/25mg  daily. Patient continues to have swelling and blood pressure is increasing. Patient states he did not have swelling when taking losartan 50 mg daily or when he stopped the losartan for 2 days before getting the updated prescription with HCTZ in it. Patient would like to know if he could go back to losartan 50mg  with the HCTZ. Blood pressure readings: 5/24 129/67  HR 71 5/28 135/73  HR 58 5/29 130/70  HR 65 5/30 140/77  HR 66 5/31 145/84  HR 58  Patient going first thing in the [facility] Advised to continue to monitor and I will make Dr Kirke CorinArida aware and notify him with further recommendations.  Concerning labs. Patient due to lab work at PCP on 04/20/17 for annual visit. Advised him since recent medication change was made it was necessary for him to get BMP and suggested him call his PCP to see if [facility] for [facility] to get at that time.

## 2017-04-14 ENCOUNTER — Telehealth: Payer: Self-pay | Admitting: Family Medicine

## 2017-04-14 NOTE — Telephone Encounter (Signed)
Reviewed Dr. Jari SportsmanArida's recommendations w/pt who verbalized understanding. He is agreeable to continue losartan/HCTZ tablet, elevate legs and follow low sodium diet. Pt will call back in a week in sx are not improved.

## 2017-04-14 NOTE — Telephone Encounter (Signed)
Had bmp, please advise

## 2017-04-14 NOTE — Telephone Encounter (Signed)
PATIENT STATES HE HAS AN APPOINTMENT SCHEDULED WITH Terry Vega FOR HIS ANNUAL PHYSICAL ON THURS. 04/20/2017. HE SAW HIS CARDIOLOGIST YESTERDAY (04/13/17) Terry Vega AND HE DID SOME BLOOD WORK ON HIM. Terry Vega WOULD LIKE TO KNOW IF HE STILL NEEDS TO COME IN EARLY MORNING TO HAVE HIS BLOOD DRAWN FOR HIS PHYSICAL THURS. OR IF THESE TEST THAT Terry Vega DID WILL BE GOOD ENOUGH FOR Terry Vega? HE SAID DR. GREENE SHOULD BE ABLE TO SEE WHAT TEST WAS DONE ON "MY CHART."

## 2017-04-15 NOTE — Telephone Encounter (Signed)
I would still recommend fasting labs as he did not have cholesterol testing at that time, and we may discuss prostate testing as well.

## 2017-04-15 NOTE — Telephone Encounter (Signed)
appt is at 230 so wants to come in am for fasting labs please place orders.  Clerical please advise pt to come in fasting for labs before appt.

## 2017-04-15 NOTE — Telephone Encounter (Signed)
I will place lab orders when we discus which labs we are checking at office visit. He can still come in that morning for fasting lab work and will order specific tests that afternoon on blood he had drawn in the morning

## 2017-04-20 ENCOUNTER — Encounter: Payer: Self-pay | Admitting: Cardiovascular Disease

## 2017-04-20 ENCOUNTER — Ambulatory Visit (INDEPENDENT_AMBULATORY_CARE_PROVIDER_SITE_OTHER): Payer: Commercial Managed Care - HMO | Admitting: Family Medicine

## 2017-04-20 ENCOUNTER — Encounter: Payer: Self-pay | Admitting: Family Medicine

## 2017-04-20 VITALS — BP 130/76 | HR 63 | Temp 98.0°F | Resp 18 | Ht 66.54 in | Wt 212.0 lb

## 2017-04-20 DIAGNOSIS — Z125 Encounter for screening for malignant neoplasm of prostate: Secondary | ICD-10-CM | POA: Diagnosis not present

## 2017-04-20 DIAGNOSIS — Z8042 Family history of malignant neoplasm of prostate: Secondary | ICD-10-CM

## 2017-04-20 DIAGNOSIS — E039 Hypothyroidism, unspecified: Secondary | ICD-10-CM

## 2017-04-20 DIAGNOSIS — Z131 Encounter for screening for diabetes mellitus: Secondary | ICD-10-CM | POA: Diagnosis not present

## 2017-04-20 DIAGNOSIS — Z0000 Encounter for general adult medical examination without abnormal findings: Secondary | ICD-10-CM

## 2017-04-20 DIAGNOSIS — Z23 Encounter for immunization: Secondary | ICD-10-CM

## 2017-04-20 DIAGNOSIS — E786 Lipoprotein deficiency: Secondary | ICD-10-CM

## 2017-04-20 MED ORDER — ZOSTER VAC RECOMB ADJUVANTED 50 MCG/0.5ML IM SUSR: 0.5000 mL | Freq: Once | INTRAMUSCULAR | 1 refills | Status: AC

## 2017-04-20 MED ORDER — LEVOTHYROXINE SODIUM 100 MCG PO TABS: 100.0000 ug | ORAL_TABLET | Freq: Every day | ORAL | 3 refills | Status: DC

## 2017-04-20 NOTE — Progress Notes (Signed)
   Subjective:    Patient ID: Terry Vega, male    DOB: October 25, 1946, 71 y.o.   MRN: [id number]  HPI    Review of Systems  Constitutional: Negative.   HENT: Negative.   Eyes: Negative.   Respiratory: Negative.   Cardiovascular: Negative.   Gastrointestinal: Negative.   Endocrine: Negative.   Genitourinary: Negative.   Musculoskeletal: Negative.   Allergic/Immunologic: Positive for environmental allergies.  Neurological: Negative.   Hematological: Negative.   Psychiatric/Behavioral: Negative.        Objective:   Physical Exam        Assessment & Plan:

## 2017-04-20 NOTE — Progress Notes (Signed)
Subjective:  By signing my name below, I, Stann Ore, attest that this documentation has been prepared under the direction and in the presence of Meredith Staggers, MD. Electronically Signed: Stann Ore, Scribe. 04/20/2017 , 3:32 PM .  Patient was seen in Room 27 .   Patient ID: Terry Vega, male    DOB: 30-Oct-1946, 71 y.o.   MRN: [id number] Chief Complaint  Patient presents with  . Annual Exam   HPI Terry Vega is a 71 y.o. male Here for annual physical. Patient has history of HTN, hypothyroidism, PVC's and peripheral edema.   He is retired now for 5 years- he was previously a Production designer, theatre/television/film in [employer].   Cardiology He is followed by Dr. Kirke Corin with office visit on Feb 16th. From cardiologist standpoint, he had echocardiogram done in Dec with normal LV function, mild dilated left atrium, mild aortic regurgitation, nuclear stress test without evidence of ischemia. Previous leg edema improved with coreg, but BP was elevating so losartan 50mg  was added during Feb visit, with plan of follow up in 1 year. Losartan was then increased to 100mg  QD on May 14th, then changed to Losartan-HCTZ 100-25mg  QD on May 23rd. He is also taking aspirin 81mg  QD.   He has a blood pressure cuff at home, and checks daily.   Hypothyroidism  Lab Results  Component Value Date   TSH 5.89 (H) 03/17/2016   His free T4 and T3 were normal, but with elevated TSH, he was increased to QD on May 14th. He denies hot/cold intolerant or marked change in hair loss.   Lipid screening Lab Results  Component Value Date   CHOL 159 03/17/2016   HDL 38 (L) 03/17/2016   LDLCALC 102 03/17/2016   TRIG 96 03/17/2016   CHOLHDL 4.2 03/17/2016   Advised HDL will improve with exercise.   Seasonal allergies He takes claritin 10mg  QD PRN for seasonal allergies.   Cancer Screening Colonoscopy: June 2016, Dr. Juanda Chance, which showed mild diverticulosis, recheck 10 years.  Prostate cancer screening:  Lab  Results  Component Value Date   PSA 0.57 03/17/2016   PSA 0.59 02/24/2015   PSA 0.70 10/09/2013   His father had history of prostate cancer around 40-28 years old.   Immunizations Immunization History  Administered Date(s) Administered  . Influenza Split 08/13/2014  . Influenza,inj,Quad PF,36+ Mos 10/09/2013  . Influenza-Unspecified 08/24/2016  . Pneumococcal Conjugate-13 09/11/2014  . Td 12/23/2008  . Zoster 04/03/2016, 07/13/2016   Shingles: received zostervax within 2 weeks of May 7th, 2017, around May 21st Pneumovax: He received this prior to age 8 (around age 89).   He received these vaccinations in [place] pharmacy on [address].   Hep C screening: negative in May 2017  Fall screening: no falls in past year.   Depression Depression screen [facility] 2/9 04/20/2017 03/17/2016 03/17/2016 02/24/2015 02/24/2015  Decreased Interest 0 0 0 0 0  Down, Depressed, Hopeless 0 0 0 0 0  PHQ - 2 Score 0 0 0 0 0    Vision  Visual Acuity Screening   Right eye Left eye Both eyes  Without correction:     With correction: 20/20 20/25 20/25    He is followed by an eye doctor, last seen 6-8 months ago.   Dentist He is followed by dentist every 6 months.   Exercise He does his own yard work, as well as church activities and chores. He takes an occasional aleve for rare aches in his knees.  Functional Status Survey: Is the patient deaf or have difficulty hearing?: No Does the patient have difficulty seeing, even when wearing glasses/contacts?: No Does the patient have difficulty concentrating, remembering, or making decisions?: No Does the patient have difficulty walking or climbing stairs?: No Does the patient have difficulty dressing or bathing?: No Does the patient have difficulty doing errands alone such as visiting a doctor's office or shopping?: No  Advanced Directives He does have a living will; copy was requested for chart.   Patient Active Problem List   Diagnosis Date Noted  .  Leg edema 05/06/2014  . PVC (premature ventricular contraction) 10/15/2013  . Hypertension 10/08/2012  . Hypothyroid 10/08/2012   Past Medical History:  Diagnosis Date  . Allergy   . Heart murmur   . Hypertension   . Thyroid disease    Past Surgical History:  Procedure Laterality Date  . COLONOSCOPY    . HEMORRHOID SURGERY    . VASECTOMY     No Known Allergies Prior to Admission medications   Medication Sig Start Date End Date Taking? Authorizing Provider  aspirin 81 MG tablet Take 81 mg by mouth daily.    [provider]  carvedilol (COREG) 6.25 MG tablet Take 1 tablet (6.25 mg total) by mouth 2 (two) times daily. 12/30/16   Iran Ouch, MD  cholecalciferol (VITAMIN D) 1000 UNITS tablet Take 1,000 Units by mouth daily.    [provider]  Cyanocobalamin (VITAMIN B12 PO) Take 1,000 mg by mouth 2 (two) times daily.     [provider]  levothyroxine (SYNTHROID, LEVOTHROID) 100 MCG tablet Take 1 tablet (100 mcg total) by mouth daily. 03/27/17   Shade Flood, MD  loratadine (CLARITIN) 10 MG tablet Take 10 mg by mouth daily.    [provider]  losartan-hydrochlorothiazide (HYZAAR) 100-25 MG tablet Take 1 tablet by mouth daily. 04/05/17   Iran Ouch, MD  Multiple Vitamin (MULTIVITAMIN) tablet Take 1 tablet by mouth daily.    [provider]  Naproxen Sodium (ALEVE PO) Take by mouth as needed.    [provider]  vitamin E 1000 UNIT capsule Take 1,000 Units by mouth daily.    [provider]  Zoster Vaccine Live, PF, (ZOSTAVAX) 16109 UNT/0.65ML injection Inject 19,400 Units into the skin once. 03/17/16   Collene Gobble, MD   Social History   Social History  . Marital status: Married    Spouse name: N/A  . Number of children: N/A  . Years of education: N/A   Occupational History  . Not on file.   Social History Main Topics  . Smoking status: Never Smoker  . Smokeless tobacco: Never Used  . Alcohol use  Yes     Comment: occasionally  . Drug use: No  . Sexual activity: Yes    Birth control/ protection: None   Other Topics Concern  . Not on file   Social History Narrative  . No narrative on file   Review of Systems  Allergic/Immunologic: Positive for environmental allergies.  All other systems reviewed and are negative.      Objective:   Physical Exam  Constitutional: He is oriented to person, place, and time. He appears well-developed and well-nourished.  HENT:  Head: Normocephalic and atraumatic.  Right Ear: External ear normal.  Left Ear: External ear normal.  Mouth/Throat: Oropharynx is clear and moist.  Eyes: Conjunctivae and EOM are normal. Pupils are equal, round, and reactive to light.  Neck: Normal range  of motion. Neck supple. No thyromegaly present.  Cardiovascular: Normal rate, regular rhythm, normal heart sounds and intact distal pulses.   Pulmonary/Chest: Effort normal and breath sounds normal. No respiratory distress. He has no wheezes.  Abdominal: Soft. He exhibits no distension. There is no tenderness. Hernia confirmed negative in the right inguinal area and confirmed negative in the left inguinal area.  Genitourinary: Prostate normal.  Musculoskeletal: Normal range of motion. He exhibits no edema or tenderness.  Lymphadenopathy:    He has no cervical adenopathy.  Neurological: He is alert and oriented to person, place, and time. He has normal reflexes.  Skin: Skin is warm and dry.  Psychiatric: He has a normal mood and affect. His behavior is normal.  Vitals reviewed.    Vitals:   04/20/17 1429  BP: 130/76  Pulse: 63  Resp: 18  Temp: 98 F (36.7 C)  TempSrc: Oral  SpO2: 97%  Weight: 212 lb (96.2 kg)  Height: 5' 6.53" (1.69 m)      Assessment & Plan:   Terry Vega is a 71 y.o. male Medicare annual wellness visit, subsequent - Plan: Pneumococcal polysaccharide vaccine 23-valent greater than or equal to 2yo subcutaneous/IM, Care  order/instruction:  -  - anticipatory guidance as below in AVS, screening labs if needed. Health maintenance items as above in HPI discussed/recommended as applicable.   - no concerning responses on depression, fall, or functional status screening. Any positive responses noted as above. Advanced directives discussed as in CHL.   Need for shingles vaccine - Plan: Zoster Vac Recomb Adjuvanted [facility]) injection  - s/p zostavax, but Shingrix Rx with recommendations for update.   Need for prophylactic vaccination against Streptococcus pneumoniae (pneumococcus)  - pneumovax given.   Low HDL (under 40) - Plan: Lipid panel  - continue exercise. Recheck labs.   Hypothyroidism, unspecified type - Plan: levothyroxine (SYNTHROID, LEVOTHROID) 100 MCG tablet, TSH  - check tsh, tolerating synthroid at higher dose.   Screening for diabetes mellitus - Plan: Comprehensive metabolic panel  Family history of prostate cancer - Plan: PSA Screening for prostate cancer - Plan: PSA  - We discussed pros and cons of prostate cancer screening, and after this discussion, he chose to have screening done.  Agree with FH of prostate CA.  PSA obtained, and no concerning findings on DRE.    Meds ordered this encounter  Medications  . Zoster Vac Recomb Adjuvanted [facility]) injection    Sig: Inject 0.5 mLs into the muscle once. Repeat injection once in 2-6 months.    Dispense:  0.5 mL    Refill:  1  . levothyroxine (SYNTHROID, LEVOTHROID) 100 MCG tablet    Sig: Take 1 tablet (100 mcg total) by mouth daily.    Dispense:  90 tablet    Refill:  3   Patient Instructions   Nice meeting you.  I will check some blood work including a thyroid test to see if your current dosage of Synthroid is effective. Shingles vaccine was sent to your pharmacy. Pneumonia vaccine was given today. That should bring you up-to-date for immunizations.  Depending on thyroid test, follow up between 6 months and one year.  Keeping you  healthy  Get these tests  Blood pressure- Have your blood pressure checked once a year by your healthcare provider.  Normal blood pressure is 120/80  Weight- Have your body mass index (BMI) calculated to screen for obesity.  BMI is a measure of body fat based on height and weight. You can  also calculate your own BMI at [email or web address].  Cholesterol- Have your cholesterol checked every year.  Diabetes- Have your blood sugar checked regularly if you have high blood pressure, high cholesterol, have a family history of diabetes or if you are overweight.  Screening for Colon Cancer- Colonoscopy starting at age 71.  Screening may begin sooner depending on your family history and other health conditions. Follow up colonoscopy as directed by your Gastroenterologist.  Screening for Prostate Cancer- Both blood work (PSA) and a rectal exam help screen for Prostate Cancer.  Screening begins at age 17 with African-American men and at age 63 with Caucasian men.  Screening may begin sooner depending on your family history.  Take these medicines  Aspirin- One aspirin daily can help prevent Heart disease and Stroke.  Flu shot- Every fall.  Tetanus- Every 10 years.  Zostavax- Once after the age of 45 to prevent Shingles.  Pneumonia shot- Once after the age of 79; if you are younger than 10, ask your healthcare provider if you need a Pneumonia shot.  Take these steps  Don't smoke- If you do smoke, talk to your doctor about quitting.  For tips on how to quit, go to www.smokefree.gov or call 1-800-QUIT-NOW.  Be physically active- Exercise 5 days a week for at least 30 minutes.  If you are not already physically active start slow and gradually work up to 30 minutes of moderate physical activity.  Examples of moderate activity include walking briskly, mowing the yard, dancing, swimming, bicycling, etc.  Eat a healthy diet- Eat a variety of healthy food such as fruits, vegetables, low fat milk,  low fat cheese, yogurt, lean meant, poultry, fish, beans, tofu, etc. For more information go to www.thenutritionsource.org  Drink alcohol in moderation- Limit alcohol intake to less than two drinks a day. Never drink and drive.  Dentist- Brush and floss twice daily; visit your dentist twice a year.  Depression- Your emotional health is as important as your physical health. If you're feeling down, or losing interest in things you would normally enjoy please talk to your healthcare provider.  Eye exam- Visit your eye doctor every year.  Safe sex- If you may be exposed to a sexually transmitted infection, use a condom.  Seat belts- Seat belts can save your life; always wear one.  Smoke/Carbon Monoxide detectors- These detectors need to be installed on the appropriate level of your home.  Replace batteries at least once a year.  Skin cancer- When out in the sun, cover up and use sunscreen 15 SPF or higher.  Violence- If anyone is threatening you, please tell your healthcare provider.  Living Will/ Health care power of attorney- Speak with your healthcare provider and family.    IF you received an x-ray today, you will receive an invoice from [facility]. Please contact [facility] at [phone] with questions or concerns regarding your invoice.   IF you received labwork today, you will receive an invoice from [facility]. Please contact [facility] at [phone] with questions or concerns regarding your invoice.   Our billing staff will not be able to assist you with questions regarding bills from these companies.  You will be contacted with the lab results as soon as they are available. The fastest way to get your results is to activate your My Chart account. Instructions are located on the last page of this paperwork. If you have not heard from [place] regarding the results in 2 weeks, please contact this office.  I personally performed the services described in this  documentation, which was scribed in my presence. The recorded information has been reviewed and considered for accuracy and completeness, addended by me as needed, and agree with information above.  Signed,   Meredith StaggersJeffrey Jullian Previti, MD Primary Care at [facility].  04/21/17 1:56 PM

## 2017-04-20 NOTE — Patient Instructions (Addendum)
Nice meeting you.  I will check some blood work including a thyroid test to see if your current dosage of Synthroid is effective. Shingles vaccine was sent to your pharmacy. Pneumonia vaccine was given today. That should bring you up-to-date for immunizations.  Depending on thyroid test, follow up between 6 months and one year.  Keeping you healthy  Get these tests  Blood pressure- Have your blood pressure checked once a year by your healthcare provider.  Normal blood pressure is 120/80  Weight- Have your body mass index (BMI) calculated to screen for obesity.  BMI is a measure of body fat based on height and weight. You can also calculate your own BMI at [email or web address].  Cholesterol- Have your cholesterol checked every year.  Diabetes- Have your blood sugar checked regularly if you have high blood pressure, high cholesterol, have a family history of diabetes or if you are overweight.  Screening for Colon Cancer- Colonoscopy starting at age 33.  Screening may begin sooner depending on your family history and other health conditions. Follow up colonoscopy as directed by your Gastroenterologist.  Screening for Prostate Cancer- Both blood work (PSA) and a rectal exam help screen for Prostate Cancer.  Screening begins at age 106 with African-American men and at age 63 with Caucasian men.  Screening may begin sooner depending on your family history.  Take these medicines  Aspirin- One aspirin daily can help prevent Heart disease and Stroke.  Flu shot- Every fall.  Tetanus- Every 10 years.  Zostavax- Once after the age of 73 to prevent Shingles.  Pneumonia shot- Once after the age of 74; if you are younger than 71, ask your healthcare provider if you need a Pneumonia shot.  Take these steps  Don't smoke- If you do smoke, talk to your doctor about quitting.  For tips on how to quit, go to www.smokefree.gov or call 1-800-QUIT-NOW.  Be physically active- Exercise 5 days a week  for at least 30 minutes.  If you are not already physically active start slow and gradually work up to 30 minutes of moderate physical activity.  Examples of moderate activity include walking briskly, mowing the yard, dancing, swimming, bicycling, etc.  Eat a healthy diet- Eat a variety of healthy food such as fruits, vegetables, low fat milk, low fat cheese, yogurt, lean meant, poultry, fish, beans, tofu, etc. For more information go to www.thenutritionsource.org  Drink alcohol in moderation- Limit alcohol intake to less than two drinks a day. Never drink and drive.  Dentist- Brush and floss twice daily; visit your dentist twice a year.  Depression- Your emotional health is as important as your physical health. If you're feeling down, or losing interest in things you would normally enjoy please talk to your healthcare provider.  Eye exam- Visit your eye doctor every year.  Safe sex- If you may be exposed to a sexually transmitted infection, use a condom.  Seat belts- Seat belts can save your life; always wear one.  Smoke/Carbon Monoxide detectors- These detectors need to be installed on the appropriate level of your home.  Replace batteries at least once a year.  Skin cancer- When out in the sun, cover up and use sunscreen 15 SPF or higher.  Violence- If anyone is threatening you, please tell your healthcare provider.  Living Will/ Health care power of attorney- Speak with your healthcare provider and family.    IF you received an x-ray today, you will receive an invoice from [facility]. Please contact [facility] at  [phone] with questions or concerns regarding your invoice.   IF you received labwork today, you will receive an invoice from [facility]. Please contact [facility] at [phone] with questions or concerns regarding your invoice.   Our billing staff will not be able to assist you with questions regarding bills from these companies.  You will be  contacted with the lab results as soon as they are available. The fastest way to get your results is to activate your My Chart account. Instructions are located on the last page of this paperwork. If you have not heard from us regarding the results in 2 weeks, please contact this office.

## 2017-04-21 ENCOUNTER — Other Ambulatory Visit: Payer: Self-pay | Admitting: *Deleted

## 2017-04-21 LAB — COMPREHENSIVE METABOLIC PANEL
A/G RATIO: 1.5 (ref 1.2–2.2)
ALT: 16 IU/L (ref 0–44)
AST: 20 IU/L (ref 0–40)
Albumin: 4.3 g/dL (ref 3.5–4.8)
Alkaline Phosphatase: 41 IU/L (ref 39–117)
BUN/Creatinine Ratio: 15 (ref 10–24)
BUN: 13 mg/dL (ref 8–27)
Bilirubin Total: 0.6 mg/dL (ref 0.0–1.2)
CALCIUM: 9.7 mg/dL (ref 8.6–10.2)
CO2: 28 mmol/L (ref 18–29)
CREATININE: 0.87 mg/dL (ref 0.76–1.27)
Chloride: 98 mmol/L (ref 96–106)
GFR, EST AFRICAN AMERICAN: 100 mL/min/{1.73_m2} (ref >59)
GFR, EST NON AFRICAN AMERICAN: 87 mL/min/{1.73_m2} (ref >59)
Globulin, Total: 2.8 g/dL (ref 1.5–4.5)
Glucose: 99 mg/dL (ref 65–99)
POTASSIUM: 4 mmol/L (ref 3.5–5.2)
Sodium: 142 mmol/L (ref 134–144)
TOTAL PROTEIN: 7.1 g/dL (ref 6.0–8.5)

## 2017-04-21 LAB — PSA: Prostate Specific Ag, Serum: 0.6 ng/mL (ref 0.0–4.0)

## 2017-04-21 LAB — LIPID PANEL
CHOL/HDL RATIO: 4.1 ratio (ref 0.0–5.0)
Cholesterol, Total: 171 mg/dL (ref 100–199)
HDL: 42 mg/dL (ref >39)
LDL CALC: 106 mg/dL — AB (ref 0–99)
TRIGLYCERIDES: 114 mg/dL (ref 0–149)
VLDL CHOLESTEROL CAL: 23 mg/dL (ref 5–40)

## 2017-04-21 LAB — TSH: TSH: 5.36 u[IU]/mL — AB (ref 0.450–4.500)

## 2017-04-21 MED ORDER — LOSARTAN POTASSIUM-HCTZ 100-25 MG PO TABS: 1.0000 | ORAL_TABLET | Freq: Every day | ORAL | 3 refills | Status: DC

## 2017-04-26 ENCOUNTER — Encounter: Payer: Self-pay | Admitting: Family Medicine

## 2017-06-16 DIAGNOSIS — M7989 Other specified soft tissue disorders: Secondary | ICD-10-CM | POA: Diagnosis not present

## 2017-06-16 DIAGNOSIS — L03116 Cellulitis of left lower limb: Secondary | ICD-10-CM | POA: Diagnosis not present

## 2017-06-18 DIAGNOSIS — L03116 Cellulitis of left lower limb: Secondary | ICD-10-CM | POA: Diagnosis not present

## 2017-06-18 DIAGNOSIS — M79662 Pain in left lower leg: Secondary | ICD-10-CM | POA: Diagnosis not present

## 2017-06-23 ENCOUNTER — Ambulatory Visit (INDEPENDENT_AMBULATORY_CARE_PROVIDER_SITE_OTHER): Payer: Commercial Managed Care - HMO | Admitting: Family Medicine

## 2017-06-23 ENCOUNTER — Encounter: Payer: Self-pay | Admitting: Family Medicine

## 2017-06-23 VITALS — BP 126/71 | HR 74 | Temp 99.2°F | Resp 16 | Ht 66.5 in | Wt 210.0 lb

## 2017-06-23 DIAGNOSIS — M25472 Effusion, left ankle: Secondary | ICD-10-CM | POA: Diagnosis not present

## 2017-06-23 DIAGNOSIS — M25475 Effusion, left foot: Secondary | ICD-10-CM | POA: Diagnosis not present

## 2017-06-23 DIAGNOSIS — L03119 Cellulitis of unspecified part of limb: Secondary | ICD-10-CM | POA: Diagnosis not present

## 2017-06-23 DIAGNOSIS — W57XXXA Bitten or stung by nonvenomous insect and other nonvenomous arthropods, initial encounter: Secondary | ICD-10-CM | POA: Diagnosis not present

## 2017-06-23 MED ORDER — CLINDAMYCIN HCL 150 MG PO CAPS: 300.0000 mg | ORAL_CAPSULE | Freq: Three times a day (TID) | ORAL | 0 refills | Status: DC

## 2017-06-23 NOTE — Patient Instructions (Addendum)
Although the initial swelling may have been due to cellulitis, that appears to be improving at this point. As swelling is increasing throughout the day, improving at night, some of this may be due to some previous issues with lower leg swelling.  Try the compression stockings during the day, continue antibiotic for 3 more days, and recheck on Monday. If any new or worsening redness or discharge, fevers, or other worsening return sooner.   Cellulitis, Adult Cellulitis is a skin infection. The infected area is usually red and tender. This condition occurs most often in the arms and lower legs. The infection can travel to the muscles, blood, and underlying tissue and become serious. It is very important to get treated for this condition. What are the causes? Cellulitis is caused by bacteria. The bacteria enter through a break in the skin, such as a cut, burn, insect bite, open sore, or crack. What increases the risk? This condition is more likely to occur in people who:  Have a weak defense system (immune system).  Have open wounds on the skin such as cuts, burns, bites, and scrapes. Bacteria can enter the body through these open wounds.  Are older.  Have diabetes.  Have a type of long-lasting (chronic) liver disease (cirrhosis) or kidney disease.  Use IV drugs.  What are the signs or symptoms? Symptoms of this condition include:  Redness, streaking, or spotting on the skin.  Swollen area of the skin.  Tenderness or pain when an area of the skin is touched.  Warm skin.  Fever.  Chills.  Blisters.  How is this diagnosed? This condition is diagnosed based on a medical history and physical exam. You may also have tests, including:  Blood tests.  Lab tests.  Imaging tests.  How is this treated? Treatment for this condition may include:  Medicines, such as antibiotic medicines or antihistamines.  Supportive care, such as rest and application of cold or warm cloths (cold  or warm compresses) to the skin.  Hospital care, if the condition is severe.  The infection usually gets better within 1-2 days of treatment. Follow these instructions at home:  Take over-the-counter and prescription medicines only as told by your health care provider.  If you were prescribed an antibiotic medicine, take it as told by your health care provider. Do not stop taking the antibiotic even if you start to feel better.  Drink enough fluid to keep your urine clear or pale yellow.  Do not touch or rub the infected area.  Raise (elevate) the infected area above the level of your heart while you are sitting or lying down.  Apply warm or cold compresses to the affected area as told by your health care provider.  Keep all follow-up visits as told by your health care provider. This is important. These visits let your health care provider make sure a more serious infection is not developing. Contact a health care provider if:  You have a fever.  Your symptoms do not improve within 1-2 days of starting treatment.  Your bone or joint underneath the infected area becomes painful after the skin has healed.  Your infection returns in the same area or another area.  You notice a swollen bump in the infected area.  You develop new symptoms.  You have a general ill feeling (malaise) with muscle aches and pains. Get help right away if:  Your symptoms get worse.  You feel very sleepy.  You develop vomiting or diarrhea that persists.  You notice red streaks coming from the infected area.  Your red area gets larger or turns dark in color. This information is not intended to replace advice given to you by your health care provider. Make sure you discuss any questions you have with your health care provider. Document Released: 08/10/2005 Document Revised: 03/10/2016 Document Reviewed: 09/09/2015 Elsevier Interactive Patient Education  2017 Elsevier Inc.    Peripheral  Edema Peripheral edema is swelling that is caused by a buildup of fluid. Peripheral edema most often affects the lower legs, ankles, and feet. It can also develop in the arms, hands, and face. The area of the body that has peripheral edema will look swollen. It may also feel heavy or warm. Your clothes may start to feel tight. Pressing on the area may make a temporary dent in your skin. You may not be able to move your arm or leg as much as usual. There are many causes of peripheral edema. It can be a complication of other diseases, such as congestive heart failure, kidney disease, or a problem with your blood circulation. It also can be a side effect of certain medicines. It often happens to women during pregnancy. Sometimes, the cause is not known. Treating the underlying condition is often the only treatment for peripheral edema. Follow these instructions at home: Pay attention to any changes in your symptoms. Take these actions to help with your discomfort:  Raise (elevate) your legs while you are sitting or lying down.  Move around often to prevent stiffness and to lessen swelling. Do not sit or stand for long periods of time.  Wear support stockings as told by your health care provider.  Follow instructions from your health care provider about limiting salt (sodium) in your diet. Sometimes eating less salt can reduce swelling.  Take over-the-counter and prescription medicines only as told by your health care provider. Your health care provider may prescribe medicine to help your body get rid of excess water (diuretic).  Keep all follow-up visits as told by your health care provider. This is important.  Contact a health care provider if:  You have a fever.  Your edema starts suddenly or is getting worse, especially if you are pregnant or have a medical condition.  You have swelling in only one leg.  You have increased swelling and pain in your legs. Get help right away if:  You  develop shortness of breath, especially when you are lying down.  You have pain in your chest or abdomen.  You feel weak.  You faint. This information is not intended to replace advice given to you by your health care provider. Make sure you discuss any questions you have with your health care provider. Document Released: 12/08/2004 Document Revised: 04/04/2016 Document Reviewed: 05/13/2015 Elsevier Interactive Patient Education  2018 [employer]    IF you received an x-ray today, you will receive an invoice from [facility]. Please contact [facility] at [phone] with questions or concerns regarding your invoice.   IF you received labwork today, you will receive an invoice from [facility]. Please contact [facility] at [phone] with questions or concerns regarding your invoice.   Our billing staff will not be able to assist you with questions regarding bills from these companies.  You will be contacted with the lab results as soon as they are available. The fastest way to get your results is to activate your My Chart account. Instructions are located on the last page of this paperwork. If you  have not heard from [place] regarding the results in 2 weeks, please contact this office.

## 2017-06-23 NOTE — Progress Notes (Signed)
Subjective:    Patient ID: Terry Vega, male    DOB: 07/08/1946, 71 y.o.   MRN: [id number]  HPI Terry Vega is a 71 y.o. male Here for possible cellulitis of left foot/ankle.   [place] weeks [place] [facility] [place] [facility] [place] Fever, chills, body aches, temp 100.6.  But sx's resolved in 48 hours on their own. Back to normal in a few days.   Mowing grass July 29th, though may have been bit by something on back of left ankle. Hard area, red. Itchy initially only on back of ankle. More swelling and redness in foot and ankle 8 days ago. [facility] - no DVT by ultrasound, had XR. Diagnosed with cellulitis, 1 week ago, started on Clindamycin 300mg  tid. Took last 2 doses today. Redness was improving 5 days ago, when saw ER in follow up.  This week notes redness and improving in the morning, but redness and swelling returns during the day as walking on it. Swelling has persisted, but redness improved with antibiotics.  No diarrhea or side effects with antibiotics.   Has had some lower leg swelling in past prior to adjusting BP meds. Swelling has not been an issue recently before current infection.   No chest pains, no dyspnea.  No calf pain or swelling, no hx of DVT.    Patient Active Problem List   Diagnosis Date Noted  . Leg edema 05/06/2014  . PVC (premature ventricular contraction) 10/15/2013  . Hypertension 10/08/2012  . Hypothyroid 10/08/2012   Past Medical History:  Diagnosis Date  . Allergy   . Heart murmur   . Hypertension   . Thyroid disease    Past Surgical History:  Procedure Laterality Date  . COLONOSCOPY    . HEMORRHOID SURGERY    . VASECTOMY     No Known Allergies Prior to Admission medications   Medication Sig Start Date End Date Taking? Authorizing Provider  aspirin 81 MG tablet Take 81 mg by mouth daily.   Yes [provider]  carvedilol (COREG) 6.25 MG tablet Take 1 tablet (6.25 mg total) by mouth 2  (two) times daily. 12/30/16  Yes [name], [name], MD  cholecalciferol (VITAMIN D) 1000 UNITS tablet Take 1,000 Units by mouth daily.   Yes [provider]  Cyanocobalamin (VITAMIN B12 PO) Take 1,000 mg by mouth 2 (two) times daily.    Yes [provider]  levothyroxine (SYNTHROID, LEVOTHROID) 100 MCG tablet Take 1 tablet (100 mcg total) by mouth daily. 04/20/17  Yes [name], Koichi Platte R, MD  loratadine (CLARITIN) 10 MG tablet Take 10 mg by mouth daily.   Yes [provider]  losartan-hydrochlorothiazide (HYZAAR) 100-25 MG tablet Take 1 tablet by mouth daily. 04/21/17  Yes [name], [name], MD  Multiple Vitamin (MULTIVITAMIN) tablet Take 1 tablet by mouth daily.   Yes [provider]  Naproxen Sodium (ALEVE PO) Take by mouth as needed.   Yes [provider]  vitamin E 1000 UNIT capsule Take 1,000 Units by mouth daily.   Yes [provider]  Zoster Vaccine Live, PF, (ZOSTAVAX) [id number] UNT/0.65ML injection Inject 19,400 Units into the skin once. 03/17/16  Yes Daub, Maylon PeppersSteven A, MD   Social History   Social History  . Marital status: Married    Spouse name: N/A  . Number of children: N/A  . Years of education: N/A   Occupational History  . Not on file.  Social History Main Topics  . Smoking status: Never Smoker  . Smokeless tobacco: Never Used  . Alcohol use Yes     Comment: occasionally  . Drug use: No  . Sexual activity: Yes    Birth control/ protection: None   Other Topics Concern  . Not on file   Social History Narrative  . No narrative on file    Review of Systems  Constitutional: Negative for chills and fever.  Respiratory: Negative for cough and shortness of breath.   Cardiovascular: Positive for leg swelling (ankle, not calf. ). Negative for chest pain.  Skin: Positive for color change and wound (small abrasion on inside ankle today. ).  other per HPI.      Objective:   Physical Exam  Constitutional: He is oriented to  person, place, and time. He appears well-developed and well-nourished.  HENT:  Head: Normocephalic and atraumatic.  Eyes: Pupils are equal, round, and reactive to light. EOM are normal.  Neck: No JVD present. Carotid bruit is not present.  Cardiovascular: Normal rate, regular rhythm and normal heart sounds.   No murmur heard. Pulmonary/Chest: Effort normal and breath sounds normal. He has no rales.  Musculoskeletal: He exhibits no edema.  Neurological: He is alert and oriented to person, place, and time.  Skin: Skin is warm and dry.     Psychiatric: He has a normal mood and affect.  Vitals reviewed.  Vitals:   06/23/17 1633  BP: 126/71  Pulse: 74  Resp: 16  Temp: 99.2 F (37.3 C)  TempSrc: Oral  SpO2: 96%  Weight: 210 lb (95.3 kg)  Height: 5' 6.5" (1.689 m)       Assessment & Plan:    Terry Vega is a 71 y.o. male Swelling of foot joint, left  Left ankle swelling  Cellulitis of ankle  Insect bite, initial encounter  Probable initial insect bite with secondary induration, then appears to have proceeded to cellulitis. Does have some possible peripheral vascular disease or chronic venous insufficiency with edema. Erythema/cellulitis is improving, swelling improves overnight and then returns during the day.  -Has compression stockings at home, recommended using those during the day, continue clindamycin 300 mg 3 times a day for the next 3 days for total 10 day course, then recheck in 3 days. Sooner if worse  Meds ordered this encounter  Medications  . DISCONTD: clindamycin (CLEOCIN) 150 MG capsule    Sig: Take 2 capsules (300 mg total) by mouth 3 (three) times daily.    Dispense:  9 capsule    Refill:  0   Patient Instructions    Although the initial swelling may have been due to cellulitis, that appears to be improving at this point. As swelling is increasing throughout the day, improving at night, some of this may be due to some previous issues with lower leg  swelling.  Try the compression stockings during the day, continue antibiotic for 3 more days, and recheck on Monday. If any new or worsening redness or discharge, fevers, or other worsening return sooner.   Cellulitis, Adult Cellulitis is a skin infection. The infected area is usually red and tender. This condition occurs most often in the arms and lower legs. The infection can travel to the muscles, blood, and underlying tissue and become serious. It is very important to get treated for this condition. What are the causes? Cellulitis is caused by bacteria. The bacteria enter through a break in the skin, such as a cut, burn, insect  bite, open sore, or crack. What increases the risk? This condition is more likely to occur in people who:  Have a weak defense system (immune system).  Have open wounds on the skin such as cuts, burns, bites, and scrapes. Bacteria can enter the body through these open wounds.  Are older.  Have diabetes.  Have a type of long-lasting (chronic) liver disease (cirrhosis) or kidney disease.  Use IV drugs.  What are the signs or symptoms? Symptoms of this condition include:  Redness, streaking, or spotting on the skin.  Swollen area of the skin.  Tenderness or pain when an area of the skin is touched.  Warm skin.  Fever.  Chills.  Blisters.  How is this diagnosed? This condition is diagnosed based on a medical history and physical exam. You may also have tests, including:  Blood tests.  Lab tests.  Imaging tests.  How is this treated? Treatment for this condition may include:  Medicines, such as antibiotic medicines or antihistamines.  Supportive care, such as rest and application of cold or warm cloths (cold or warm compresses) to the skin.  Hospital care, if the condition is severe.  The infection usually gets better within 1-2 days of treatment. Follow these instructions at home:  Take over-the-counter and prescription medicines only  as told by your health care provider.  If you were prescribed an antibiotic medicine, take it as told by your health care provider. Do not stop taking the antibiotic even if you start to feel better.  Drink enough fluid to keep your urine clear or pale yellow.  Do not touch or rub the infected area.  Raise (elevate) the infected area above the level of your heart while you are sitting or lying down.  Apply warm or cold compresses to the affected area as told by your health care provider.  Keep all follow-up visits as told by your health care provider. This is important. These visits let your health care provider make sure a more serious infection is not developing. Contact a health care provider if:  You have a fever.  Your symptoms do not improve within 1-2 days of starting treatment.  Your bone or joint underneath the infected area becomes painful after the skin has healed.  Your infection returns in the same area or another area.  You notice a swollen bump in the infected area.  You develop new symptoms.  You have a general ill feeling (malaise) with muscle aches and pains. Get help right away if:  Your symptoms get worse.  You feel very sleepy.  You develop vomiting or diarrhea that persists.  You notice red streaks coming from the infected area.  Your red area gets larger or turns dark in color. This information is not intended to replace advice given to you by your health care provider. Make sure you discuss any questions you have with your health care provider. Document Released: 08/10/2005 Document Revised: 03/10/2016 Document Reviewed: 09/09/2015 Elsevier Interactive Patient Education  2017 Elsevier Inc.    Peripheral Edema Peripheral edema is swelling that is caused by a buildup of fluid. Peripheral edema most often affects the lower legs, ankles, and feet. It can also develop in the arms, hands, and face. The area of the body that has peripheral edema will  look swollen. It may also feel heavy or warm. Your clothes may start to feel tight. Pressing on the area may make a temporary dent in your skin. You may not be able to move  your arm or leg as much as usual. There are many causes of peripheral edema. It can be a complication of other diseases, such as congestive heart failure, kidney disease, or a problem with your blood circulation. It also can be a side effect of certain medicines. It often happens to women during pregnancy. Sometimes, the cause is not known. Treating the underlying condition is often the only treatment for peripheral edema. Follow these instructions at home: Pay attention to any changes in your symptoms. Take these actions to help with your discomfort:  Raise (elevate) your legs while you are sitting or lying down.  Move around often to prevent stiffness and to lessen swelling. Do not sit or stand for long periods of time.  Wear support stockings as told by your health care provider.  Follow instructions from your health care provider about limiting salt (sodium) in your diet. Sometimes eating less salt can reduce swelling.  Take over-the-counter and prescription medicines only as told by your health care provider. Your health care provider may prescribe medicine to help your body get rid of excess water (diuretic).  Keep all follow-up visits as told by your health care provider. This is important.  Contact a health care provider if:  You have a fever.  Your edema starts suddenly or is getting worse, especially if you are pregnant or have a medical condition.  You have swelling in only one leg.  You have increased swelling and pain in your legs. Get help right away if:  You develop shortness of breath, especially when you are lying down.  You have pain in your chest or abdomen.  You feel weak.  You faint. This information is not intended to replace advice given to you by your health care provider. Make sure you  discuss any questions you have with your health care provider. Document Released: 12/08/2004 Document Revised: 04/04/2016 Document Reviewed: 05/13/2015 Elsevier Interactive Patient Education  2018 [employer].    IF you received an x-ray today, you will receive an invoice from [facility]. Please contact [facility] at [phone] with questions or concerns regarding your invoice.   IF you received labwork today, you will receive an invoice from [facility]. Please contact [facility] at [phone] with questions or concerns regarding your invoice.   Our billing staff will not be able to assist you with questions regarding bills from these companies.  You will be contacted with the lab results as soon as they are available. The fastest way to get your results is to activate your My Chart account. Instructions are located on the last page of this paperwork. If you have not heard from [place] regarding the results in 2 weeks, please contact this office.       Signed,   Meredith Staggers, MD Primary Care at [facility].  06/25/17 2:31 PM

## 2017-06-24 ENCOUNTER — Encounter: Payer: Self-pay | Admitting: Family Medicine

## 2017-06-24 ENCOUNTER — Other Ambulatory Visit: Payer: Self-pay | Admitting: Emergency Medicine

## 2017-06-24 MED ORDER — CLINDAMYCIN HCL 150 MG PO CAPS: 300.0000 mg | ORAL_CAPSULE | Freq: Three times a day (TID) | ORAL | 0 refills | Status: DC

## 2017-06-26 ENCOUNTER — Ambulatory Visit (INDEPENDENT_AMBULATORY_CARE_PROVIDER_SITE_OTHER): Payer: Commercial Managed Care - HMO | Admitting: Family Medicine

## 2017-06-26 ENCOUNTER — Encounter: Payer: Self-pay | Admitting: Family Medicine

## 2017-06-26 VITALS — BP 135/74 | HR 57 | Temp 98.4°F | Resp 16 | Ht 66.5 in | Wt 212.0 lb

## 2017-06-26 DIAGNOSIS — L03116 Cellulitis of left lower limb: Secondary | ICD-10-CM | POA: Diagnosis not present

## 2017-06-26 DIAGNOSIS — R600 Localized edema: Secondary | ICD-10-CM

## 2017-06-26 NOTE — Patient Instructions (Addendum)
  I'm glad to hear that the swelling and redness is improving. Continue compression stockings, and okay to purchase lighter intensity compression stockings if you would like.   If any increased redness, new wounds, or worsening swelling, return for recheck as soon as possible. Otherwise follow-up in the next 2 weeks if the swelling is not continuing to improve.   If you have persistent swelling in the legs, I can refer you to vascular specialist to check into chronic vein or circulation issues.   IF you received an x-ray today, you will receive an invoice from [facility]. Please contact [facility] at [phone] with questions or concerns regarding your invoice.   IF you received labwork today, you will receive an invoice from [facility]. Please contact [facility] at [phone] with questions or concerns regarding your invoice.   Our billing staff will not be able to assist you with questions regarding bills from these companies.  You will be contacted with the lab results as soon as they are available. The fastest way to get your results is to activate your My Chart account. Instructions are located on the last page of this paperwork. If you have not heard from us regarding the results in 2 weeks, please contact this office.

## 2017-06-26 NOTE — Progress Notes (Signed)
Subjective:  By signing my name below, I, Stann Ore, attest that this documentation has been prepared under the direction and in the presence of Meredith Staggers, MD. Electronically Signed: Stann Ore, Scribe. 06/26/2017 , 12:25 PM .  Patient was seen in Room 26 .   Patient ID: Terry Vega, male    DOB: 1945-12-01, 71 y.o.   MRN: [id number] Chief Complaint  Patient presents with  . Follow-up    cellulitis left leg   HPI Terry Vega is a 71 y.o. male Here for follow up of left foot swelling. He was seen 3 days ago, see details of that visit; thought to have cellulitis after an insect bite. His redness had improved, and swelling improves over night but returns during the day. He was on day 7 of clindamycin. Suspected some venous insufficiency with improving cellulitis. Extended clindamycin for 3 additional days. Recommended elevation and compression stockings. He also had ultrasound done at outside facility that was negative for DVT.   Patient states his lower leg and foot swelling has improved. He wore the compression stocking to an event (his friend's 50th anniversary) 2 days ago, and to church yesterday. He was able to keep the swelling down when he kept it elevated in the afternoon. He's had some swelling into his lower leg in the past. He denies any fevers. He has 1 more dose of clindamycin. He notes achilles tendon feeling sore when wearing his compression stocking But that is better now. Redness has continued to improve.  Patient Active Problem List   Diagnosis Date Noted  . Leg edema 05/06/2014  . PVC (premature ventricular contraction) 10/15/2013  . Hypertension 10/08/2012  . Hypothyroid 10/08/2012   Past Medical History:  Diagnosis Date  . Allergy   . Heart murmur   . Hypertension   . Thyroid disease    Past Surgical History:  Procedure Laterality Date  . COLONOSCOPY    . HEMORRHOID SURGERY    . VASECTOMY     No Known Allergies Prior to Admission  medications   Medication Sig Start Date End Date Taking? Authorizing Provider  aspirin 81 MG tablet Take 81 mg by mouth daily.    [provider]  carvedilol (COREG) 6.25 MG tablet Take 1 tablet (6.25 mg total) by mouth 2 (two) times daily. 12/30/16   Iran Ouch, MD  cholecalciferol (VITAMIN D) 1000 UNITS tablet Take 1,000 Units by mouth daily.    [provider]  clindamycin (CLEOCIN) 150 MG capsule Take 2 capsules (300 mg total) by mouth 3 (three) times daily. Patient picked up 9 tablets; please fill additional 9 tablets- error corrected 06/24/17   Shade Flood, MD  Cyanocobalamin (VITAMIN B12 PO) Take 1,000 mg by mouth 2 (two) times daily.     [provider]  levothyroxine (SYNTHROID, LEVOTHROID) 100 MCG tablet Take 1 tablet (100 mcg total) by mouth daily. 04/20/17   Shade Flood, MD  loratadine (CLARITIN) 10 MG tablet Take 10 mg by mouth daily.    [provider]  losartan-hydrochlorothiazide (HYZAAR) 100-25 MG tablet Take 1 tablet by mouth daily. 04/21/17   Iran Ouch, MD  Multiple Vitamin (MULTIVITAMIN) tablet Take 1 tablet by mouth daily.    [provider]  Naproxen Sodium (ALEVE PO) Take by mouth as needed.    [provider]  vitamin E 1000 UNIT capsule Take 1,000 Units by mouth daily.    [provider]  Zoster Vaccine Live, PF, (ZOSTAVAX) 40981  UNT/0.65ML injection Inject 19,400 Units into the skin once. 03/17/16   Collene Gobbleaub, Steven A, MD   Social History   Social History  . Marital status: Married    Spouse name: N/A  . Number of children: N/A  . Years of education: N/A   Occupational History  . Not on file.   Social History Main Topics  . Smoking status: Never Smoker  . Smokeless tobacco: Never Used  . Alcohol use Yes     Comment: occasionally  . Drug use: No  . Sexual activity: Yes    Birth control/ protection: None   Other Topics Concern  . Not on file   Social History Narrative  . No  narrative on file   Review of Systems  Constitutional: Negative for fatigue, fever and unexpected weight change.  Eyes: Negative for visual disturbance.  Respiratory: Negative for cough, chest tightness and shortness of breath.   Cardiovascular: Negative for chest pain, palpitations and leg swelling.  Gastrointestinal: Negative for abdominal pain and blood in stool.  Musculoskeletal: Negative for arthralgias, gait problem, joint swelling and myalgias.  Skin: Negative for rash and wound.  Neurological: Negative for dizziness, light-headedness and headaches.       Objective:   Physical Exam  Constitutional: He is oriented to person, place, and time. He appears well-developed and well-nourished. No distress.  HENT:  Head: Normocephalic and atraumatic.  Eyes: Pupils are equal, round, and reactive to light. EOM are normal.  Neck: Neck supple.  Cardiovascular: Normal rate.   Pulses:      Dorsalis pedis pulses are 2+ on the left side.  Pulmonary/Chest: Effort normal. No respiratory distress.  Musculoskeletal: Normal range of motion. He exhibits edema (1+ trace pedal edema).  Left foot: 1+ trace pedal edema mid tibia to foot, cap refill 1 second at toes, very faint erythema into the lower leg with healed abrasions medially; calves non tender; focal area of induration from previous insect bite distal posterior leg, few centimeter proximal to calcaneus; no discharge or fluctuance; dorsal flex non tender but does feel tight Right leg: trace pedal edema to mid tibia  Neurological: He is alert and oriented to person, place, and time.  Skin: Skin is warm and dry.  Psychiatric: He has a normal mood and affect. His behavior is normal.  Nursing note and vitals reviewed.   Vitals:   06/26/17 1155  BP: 135/74  Pulse: (!) 57  Resp: 16  Temp: 98.4 F (36.9 C)  TempSrc: Oral  SpO2: 94%  Weight: 212 lb (96.2 kg)  Height: 5' 6.5" (1.689 m)      Assessment & Plan:   Terry Vega is a 71  y.o. male Pedal edema  Cellulitis of leg, left Improving, has one more dose of clindamycin. Suspect some component of chronic pedal edema, possible chronic venous insufficiency. Likely worse on left with recent insect bite/cellulitis. However he does also note a previous injury to the front of his left lower leg that may have also put him at more risk for swelling left side greater than right.  - Continue compression stockings, lower intensity stocking okay. Finish last dose of clindamycin. Follow-up if any increased redness or other signs of worsening cellulitis, or if swelling is not improving within the next 10-14 days. Sooner if worse.   No orders of the defined types were placed in this encounter.  Patient Instructions    I'm glad to hear that the swelling and redness is improving. Continue compression stockings, and okay  to purchase lighter intensity compression stockings if you would like.   If any increased redness, new wounds, or worsening swelling, return for recheck as soon as possible. Otherwise follow-up in the next 2 weeks if the swelling is not continuing to improve.   If you have persistent swelling in the legs, I can refer you to vascular specialist to check into chronic vein or circulation issues.   IF you received an x-ray today, you will receive an invoice from [facility]. Please contact [facility] at [phone] with questions or concerns regarding your invoice.   IF you received labwork today, you will receive an invoice from [facility]. Please contact [facility] at [phone] with questions or concerns regarding your invoice.   Our billing staff will not be able to assist you with questions regarding bills from these companies.  You will be contacted with the lab results as soon as they are available. The fastest way to get your results is to activate your My Chart account. Instructions are located on the last page of this paperwork. If you have not  heard from [place] regarding the results in 2 weeks, please contact this office.       I personally performed the services described in this documentation, which was scribed in my presence. The recorded information has been reviewed and considered for accuracy and completeness, addended by me as needed, and agree with information above.  Signed,   Meredith Staggers, MD Primary [facility] at [facility].  06/26/17 12:29 PM

## 2017-09-05 ENCOUNTER — Other Ambulatory Visit: Payer: Self-pay | Admitting: Cardiovascular Disease

## 2017-10-12 ENCOUNTER — Encounter: Payer: Self-pay | Admitting: Family Medicine

## 2017-10-12 ENCOUNTER — Ambulatory Visit: Payer: Medicare HMO | Admitting: Family Medicine

## 2017-10-12 ENCOUNTER — Other Ambulatory Visit: Payer: Self-pay

## 2017-10-12 VITALS — BP 130/82 | HR 84 | Temp 99.2°F | Resp 16 | Ht 66.5 in | Wt 215.6 lb

## 2017-10-12 DIAGNOSIS — J029 Acute pharyngitis, unspecified: Secondary | ICD-10-CM | POA: Diagnosis not present

## 2017-10-12 DIAGNOSIS — R52 Pain, unspecified: Secondary | ICD-10-CM

## 2017-10-12 DIAGNOSIS — J09X2 Influenza due to identified novel influenza A virus with other respiratory manifestations: Secondary | ICD-10-CM | POA: Diagnosis not present

## 2017-10-12 DIAGNOSIS — R059 Cough, unspecified: Secondary | ICD-10-CM

## 2017-10-12 DIAGNOSIS — R05 Cough: Secondary | ICD-10-CM

## 2017-10-12 LAB — POCT RAPID STREP A (OFFICE): Rapid Strep A Screen: NEGATIVE

## 2017-10-12 LAB — POC INFLUENZA A&B (BINAX/QUICKVUE)
INFLUENZA B, POC: NEGATIVE
Influenza A, POC: POSITIVE — AB

## 2017-10-12 MED ORDER — BENZONATATE 100 MG PO CAPS: 100.0000 mg | ORAL_CAPSULE | Freq: Three times a day (TID) | ORAL | 0 refills | Status: DC | PRN

## 2017-10-12 MED ORDER — OSELTAMIVIR PHOSPHATE 75 MG PO CAPS: 75.0000 mg | ORAL_CAPSULE | Freq: Two times a day (BID) | ORAL | 0 refills | Status: DC

## 2017-10-12 NOTE — Progress Notes (Signed)
Patient ID: Terry Vega, male    DOB: 10/28/1946  Age: 71 y.o. MRN: [id number]  Chief Complaint  Patient presents with  . URI    chest congestion, sore throat cough, x 3 days, body aches     Subjective:   71 year old man who is here with a history of being ill for about 3 days.  He has had some upper respiratory congestion, a fairly significant sore throat, and a nonproductive cough.  He feels like it comes up but will not come out.  He has had temperature up to the highest he recorded was 99.9.  He does not smoke.  He did have a flu shot this year.  He did some work outside [employer] and had some chills, but it was a cold day.  He is retired.  His wife has not been ill. Current allergies, medications, problem list, past/family and social histories reviewed.  Objective:  BP 130/82   Pulse 84   Temp 99.2 F (37.3 C)   Resp 16   Ht 5' 6.5" (1.689 m)   Wt 215 lb 9.6 oz (97.8 kg)   SpO2 98%   BMI 34.28 kg/m   Pleasant gentleman, alert and oriented.  TMs normal.  Throat mildly erythematous without any exudate.  There is a little edema of the uvula.  Neck supple without significant nodes.  Chest is clear clear to auscultation except for minimal right upper lung wheeze.  Heart regular without murmur.   Assessment & Plan:   Assessment: 1. Influenza due to identified novel influenza A virus with other respiratory manifestations   2. Sore throat   3. Generalized body aches   4. Cough       Plan: Strep and flu test  Orders Placed This Encounter  Procedures  . POCT rapid strep A  . POC Influenza A&B(BINAX/QUICKVUE)    Meds ordered this encounter  Medications  . oseltamivir (TAMIFLU) 75 MG capsule    Sig: Take 1 capsule (75 mg total) by mouth 2 (two) times daily.    Dispense:  10 capsule    Refill:  0  . benzonatate (TESSALON) 100 MG capsule    Sig: Take 1-2 capsules (100-200 mg total) by mouth 3 (three) times daily as needed.    Dispense:  30 capsule   Refill:  0         Patient Instructions   Drink plenty of fluids and get enough rest  Take tylenol (acetominophen) 500 mg two pills 3 times daily as needed.    Take Tamiflu 75 mg 1 twice daily for 5 days for the influenza A  Take benzonatate 100 mg 1 or 2 pills 3 times daily as needed for cough  In addition you can take some Robitussin-DM or Mucinex DM at bedtime if needed for a worsening cough.  Return if worse      IF you received an x-ray today, you will receive an invoice from [facility]. Please contact [facility] at [phone] with questions or concerns regarding your invoice.   IF you received labwork today, you will receive an invoice from [facility]. Please contact [facility] at [phone] with questions or concerns regarding your invoice.   Our billing staff will not be able to assist you with questions regarding bills from these companies.  You will be contacted with the lab results as soon as they are available. The fastest way to get your results is to activate your My Chart account. Instructions are located on  the last page of this paperwork. If you have not heard from us regarding the results in 2 weeks, please contact this office.         No Follow-up on file.   Micheline Markes, MD 10/12/2017

## 2017-10-12 NOTE — Patient Instructions (Addendum)
Drink plenty of fluids and get enough rest  Take tylenol (acetominophen) 500 mg two pills 3 times daily as needed.    Take Tamiflu 75 mg 1 twice daily for 5 days for the influenza A  Take benzonatate 100 mg 1 or 2 pills 3 times daily as needed for cough  In addition you can take some Robitussin-DM or Mucinex DM at bedtime if needed for a worsening cough.   Influenza, Adult Influenza ("the flu") is an infection in the lungs, nose, and throat (respiratory tract). It is caused by a virus. The flu causes many common cold symptoms, as well as a high fever and body aches. It can make you feel very sick. The flu spreads easily from person to person (is contagious). Getting a flu shot (influenza vaccination) every year is the best way to prevent the flu. Follow these instructions at home:  Take over-the-counter and prescription medicines only as told by your doctor.  Use a cool mist humidifier to add moisture (humidity) to the air in your home. This can make it easier to breathe.  Rest as needed.  Drink enough fluid to keep your pee (urine) clear or pale yellow.  Cover your mouth and nose when you cough or sneeze.  Wash your hands with soap and water often, especially after you cough or sneeze. If you cannot use soap and water, use hand sanitizer.  Stay home from work or school as told by your doctor. Unless you are visiting your doctor, try to avoid leaving home until your fever has been gone for 24 hours without the use of medicine.  Keep all follow-up visits as told by your doctor. This is important. How is this prevented?  Getting a yearly (annual) flu shot is the best way to avoid getting the flu. You may get the flu shot in late summer, fall, or winter. Ask your doctor when you should get your flu shot.  Wash your hands often or use hand sanitizer often.  Avoid contact with people who are sick during cold and flu season.  Eat healthy foods.  Drink plenty of fluids.  Get  enough sleep.  Exercise regularly. Contact a doctor if:  You get new symptoms.  You have: ? Chest pain. ? Watery poop (diarrhea). ? A fever.  Your cough gets worse.  You start to have more mucus.  You feel sick to your stomach (nauseous).  You throw up (vomit). Get help right away if:  You start to be short of breath or have trouble breathing.  Your skin or nails turn a bluish color.  You have very bad pain or stiffness in your neck.  You get a sudden headache.  You get sudden pain in your face or ear.  You cannot stop throwing up. This information is not intended to replace advice given to you by your health care provider. Make sure you discuss any questions you have with your health care provider. Document Released: 08/09/2008 Document Revised: 04/07/2016 Document Reviewed: 08/25/2015 Elsevier Interactive Patient Education  2017 [employer]   Return if worse      IF you received an x-ray today, you will receive an invoice from [facility]. Please contact [facility] at [phone] with questions or concerns regarding your invoice.   IF you received labwork today, you will receive an invoice from [facility]. Please contact [facility] at [phone] with questions or concerns regarding your invoice.   Our billing staff will not be able to assist you with questions  regarding bills from these companies.  You will be contacted with the lab results as soon as they are available. The fastest way to get your results is to activate your My Chart account. Instructions are located on the last page of this paperwork. If you have not heard from [place] regarding the results in 2 weeks, please contact this office.

## 2017-10-13 ENCOUNTER — Emergency Department (HOSPITAL_BASED_OUTPATIENT_CLINIC_OR_DEPARTMENT_OTHER): Payer: Medicare HMO

## 2017-10-13 ENCOUNTER — Other Ambulatory Visit: Payer: Self-pay

## 2017-10-13 ENCOUNTER — Emergency Department (HOSPITAL_BASED_OUTPATIENT_CLINIC_OR_DEPARTMENT_OTHER)
Admission: EM | Admit: 2017-10-13 | Discharge: 2017-10-14 | Disposition: A | Discharge status: Home / Self Care | Payer: Medicare HMO | Attending: Emergency Medicine | Admitting: Emergency Medicine

## 2017-10-13 ENCOUNTER — Encounter (HOSPITAL_BASED_OUTPATIENT_CLINIC_OR_DEPARTMENT_OTHER): Payer: Self-pay | Admitting: *Deleted

## 2017-10-13 DIAGNOSIS — I10 Essential (primary) hypertension: Secondary | ICD-10-CM | POA: Diagnosis not present

## 2017-10-13 DIAGNOSIS — W07XXXA Fall from chair, initial encounter: Secondary | ICD-10-CM | POA: Diagnosis not present

## 2017-10-13 DIAGNOSIS — Y929 Unspecified place or not applicable: Secondary | ICD-10-CM | POA: Diagnosis not present

## 2017-10-13 DIAGNOSIS — S0181XA Laceration without foreign body of other part of head, initial encounter: Secondary | ICD-10-CM | POA: Insufficient documentation

## 2017-10-13 DIAGNOSIS — S0993XA Unspecified injury of face, initial encounter: Secondary | ICD-10-CM | POA: Diagnosis present

## 2017-10-13 DIAGNOSIS — Y998 Other external cause status: Secondary | ICD-10-CM | POA: Diagnosis not present

## 2017-10-13 DIAGNOSIS — Z7982 Long term (current) use of aspirin: Secondary | ICD-10-CM | POA: Insufficient documentation

## 2017-10-13 DIAGNOSIS — Y9389 Activity, other specified: Secondary | ICD-10-CM | POA: Insufficient documentation

## 2017-10-13 DIAGNOSIS — R55 Syncope and collapse: Secondary | ICD-10-CM | POA: Insufficient documentation

## 2017-10-13 DIAGNOSIS — R05 Cough: Secondary | ICD-10-CM | POA: Diagnosis not present

## 2017-10-13 DIAGNOSIS — Z79899 Other long term (current) drug therapy: Secondary | ICD-10-CM | POA: Insufficient documentation

## 2017-10-13 DIAGNOSIS — S0990XA Unspecified injury of head, initial encounter: Secondary | ICD-10-CM | POA: Diagnosis not present

## 2017-10-13 DIAGNOSIS — S199XXA Unspecified injury of neck, initial encounter: Secondary | ICD-10-CM | POA: Diagnosis not present

## 2017-10-13 LAB — CBC WITH DIFFERENTIAL/PLATELET
BASOS ABS: 0 10*3/uL (ref 0.0–0.1)
BASOS PCT: 0 %
Eosinophils Absolute: 0.1 10*3/uL (ref 0.0–0.7)
Eosinophils Relative: 1 %
HEMATOCRIT: 36.4 % — AB (ref 39.0–52.0)
Hemoglobin: 12.3 g/dL — ABNORMAL LOW (ref 13.0–17.0)
Lymphocytes Relative: 14 %
Lymphs Abs: 1.6 10*3/uL (ref 0.7–4.0)
MCH: 31.5 pg (ref 26.0–34.0)
MCHC: 33.8 g/dL (ref 30.0–36.0)
MCV: 93.3 fL (ref 78.0–100.0)
MONO ABS: 1.3 10*3/uL — AB (ref 0.1–1.0)
Monocytes Relative: 12 %
NEUTROS ABS: 8.2 10*3/uL — AB (ref 1.7–7.7)
Neutrophils Relative %: 73 %
PLATELETS: 162 10*3/uL (ref 150–400)
RBC: 3.9 MIL/uL — ABNORMAL LOW (ref 4.22–5.81)
RDW: 12.3 % (ref 11.5–15.5)
WBC: 11.2 10*3/uL — ABNORMAL HIGH (ref 4.0–10.5)

## 2017-10-13 LAB — BASIC METABOLIC PANEL
Anion gap: 8 (ref 5–15)
BUN: 20 mg/dL (ref 6–20)
CALCIUM: 9.1 mg/dL (ref 8.9–10.3)
CO2: 27 mmol/L (ref 22–32)
CREATININE: 0.9 mg/dL (ref 0.61–1.24)
Chloride: 97 mmol/L — ABNORMAL LOW (ref 101–111)
GFR calc Af Amer: >60 mL/min (ref >60)
Glucose, Bld: 164 mg/dL — ABNORMAL HIGH (ref 65–99)
Potassium: 3.3 mmol/L — ABNORMAL LOW (ref 3.5–5.1)
SODIUM: 132 mmol/L — AB (ref 135–145)

## 2017-10-13 MED ORDER — LIDOCAINE-EPINEPHRINE 2 %-1:100000 IJ SOLN: 20.0000 mL | Freq: Once | INTRAMUSCULAR | Status: DC

## 2017-10-13 MED ORDER — BACITRACIN ZINC 500 UNIT/GM EX OINT
TOPICAL_OINTMENT | Freq: Two times a day (BID) | CUTANEOUS | Status: DC
  Administered 2017-10-13: via TOPICAL

## 2017-10-13 NOTE — ED Notes (Signed)
ED Provider at bedside. 

## 2017-10-13 NOTE — Discharge Instructions (Signed)
Please have sutures removed in 5-7 days. Watch for signs of infection, including fever, pus drainage, worsening redness/swelling.  Return without fail for worsening symptoms, including new fevers, difficulty breathing, recurrent passing out, confusion, signs of wound infection or any other symptoms concerning to you.

## 2017-10-13 NOTE — ED Triage Notes (Addendum)
He was diagnosed with flu recently. He was sleeping in his recliner. When he woke he stood up had a coughing spell and passed out hitting his face on the coffee table. Laceration between his eyes to the bridge of his nose. Bruising to his left eye.

## 2017-10-13 NOTE — ED Provider Notes (Signed)
[facility] EMERGENCY DEPARTMENT Provider Note   CSN: [id number] Arrival date & time: 10/13/17  2043     History   Chief Complaint Chief Complaint  Patient presents with  . Fall    HPI Terry Vega is a 71 y.o. male.  HPI 71 year old male who presents after fall due to syncope.  He has a history of hypertension, hyperlipidemia and thyroid disease.  He is not anticoagulated.  Reports that he was recently diagnosed with influenza on Monday and started taking Tamiflu on Thursday.  Overall his symptoms have been improving but he still has persistent cough.  He woke up from a nap this afternoon and started coughing.  States that he was unable to stop coughing and had a syncopal episode.  He fell out of his chair and hit his face on the coffee table.  He woke up on the ground.  Sustained a laceration between his eyes on the bridge of his nose.  Denies headache, nausea or vomiting, vision or speech changes, focal numbness or weakness, ataxia, chest pain or difficulty breathing, fevers, or confusion. Past Medical History:  Diagnosis Date  . Allergy   . Heart murmur   . Hypertension   . Thyroid disease     Patient Active Problem List   Diagnosis Date Noted  . Leg edema 05/06/2014  . PVC (premature ventricular contraction) 10/15/2013  . Hypertension 10/08/2012  . Hypothyroid 10/08/2012    Past Surgical History:  Procedure Laterality Date  . COLONOSCOPY    . HEMORRHOID SURGERY    . VASECTOMY         Home Medications    Prior to Admission medications   Medication Sig Start Date End Date Taking? Authorizing Provider  aspirin 81 MG tablet Take 81 mg by mouth daily.    [provider]  benzonatate (TESSALON) 100 MG capsule Take 1-2 capsules (100-200 mg total) by mouth 3 (three) times daily as needed. 10/12/17   Peyton Najjar, MD  carvedilol (COREG) 6.25 MG tablet TAKE 1 TABLET (6.25 MG TOTAL) BY MOUTH 2 (TWO) TIMES DAILY. 09/06/17   Iran Ouch,  MD  cholecalciferol (VITAMIN D) 1000 UNITS tablet Take 1,000 Units by mouth daily.    [provider]  clindamycin (CLEOCIN) 150 MG capsule Take 2 capsules (300 mg total) by mouth 3 (three) times daily. Patient picked up 9 tablets; please fill additional 9 tablets- error corrected Patient not taking: Reported on 10/12/2017 06/24/17   Shade Flood, MD  Cyanocobalamin (VITAMIN B12 PO) Take 1,000 mg by mouth 2 (two) times daily.     [provider]  levothyroxine (SYNTHROID, LEVOTHROID) 100 MCG tablet Take 1 tablet (100 mcg total) by mouth daily. 04/20/17   Shade Flood, MD  loratadine (CLARITIN) 10 MG tablet Take 10 mg by mouth daily.    [provider]  losartan-hydrochlorothiazide (HYZAAR) 100-25 MG tablet Take 1 tablet by mouth daily. 04/21/17   Iran Ouch, MD  Multiple Vitamin (MULTIVITAMIN) tablet Take 1 tablet by mouth daily.    [provider]  Naproxen Sodium (ALEVE PO) Take by mouth as needed.    [provider]  oseltamivir (TAMIFLU) 75 MG capsule Take 1 capsule (75 mg total) by mouth 2 (two) times daily. 10/12/17   Peyton Najjar, MD  vitamin E 1000 UNIT capsule Take 1,000 Units by mouth daily.    [provider]  Zoster Vaccine Live, PF, (ZOSTAVAX) 40981 UNT/0.65ML injection Inject 19,400 Units into the  skin once. 03/17/16   Collene Gobble, MD    Family History Family History  Problem Relation Age of Onset  . Heart disease Mother   . Cancer Father   . Diabetes Father   . Stroke Maternal Grandmother   . Diabetes Maternal Grandmother   . Colon cancer Neg Hx   . Esophageal cancer Neg Hx   . Rectal cancer Neg Hx   . Stomach cancer Neg Hx     Social History Social History   Tobacco Use  . Smoking status: Never Smoker  . Smokeless tobacco: Never Used  Substance Use Topics  . Alcohol use: Yes    Comment: occasionally  . Drug use: No     Allergies   Patient has no known allergies.   Review of  Systems Review of Systems  Constitutional: Negative for fever.  Respiratory: Positive for cough.   Cardiovascular: Negative for chest pain and leg swelling.  Gastrointestinal: Negative for abdominal pain, diarrhea, nausea and vomiting.  Genitourinary: Negative for difficulty urinating.  All other systems reviewed and are negative.    Physical Exam Updated Vital Signs BP 118/68   Pulse 86   Temp 99.2 F (37.3 C) (Oral)   Resp 20   Ht 5' 6.5" (1.689 m)   Wt 97.5 kg (215 lb)   SpO2 96%   BMI 34.18 kg/m   Physical Exam Physical Exam  Nursing note and vitals reviewed. Constitutional: Well developed, well nourished, non-toxic, and in no acute distress Head: Normocephalic and 1.5 cm laceration between the eyebrows.  Mouth/Throat: Oropharynx is clear and moist.  Neck: Normal range of motion. Neck supple. no cervical spine tenderness. Cardiovascular: Normal rate and regular rhythm.   Pulmonary/Chest: Effort normal and breath sounds normal.  Abdominal: Soft. There is no tenderness. There is no rebound and no guarding.  Musculoskeletal: Normal range of motion.  Neurological: Alert, no facial droop, fluent speech, moves all extremities symmetrically Skin: Skin is warm and dry.  Psychiatric: Cooperative   ED Treatments / Results  Labs (all labs ordered are listed, but only abnormal results are displayed) Labs Reviewed  CBC WITH DIFFERENTIAL/PLATELET - Abnormal; Notable for the following components:      Result Value   WBC 11.2 (*)    RBC 3.90 (*)    Hemoglobin 12.3 (*)    HCT 36.4 (*)    Neutro Abs 8.2 (*)    Monocytes Absolute 1.3 (*)    All other components within normal limits  BASIC METABOLIC PANEL - Abnormal; Notable for the following components:   Sodium 132 (*)    Potassium 3.3 (*)    Chloride 97 (*)    Glucose, Bld 164 (*)    All other components within normal limits    EKG  EKG Interpretation  Date/Time:  Friday October 13 2017 21:03:30 EST Ventricular  Rate:  82 PR Interval:    QRS Duration: 112 QT Interval:  353 QTC Calculation: 413 R Axis:   -4 Text Interpretation:  Sinus rhythm Ventricular premature complex Borderline intraventricular conduction delay No acute changes Confirmed by Crista Curb [phone]) on 10/13/2017 11:18:49 PM       Radiology Dg Chest 2 View  Result Date: 10/13/2017 CLINICAL DATA:  Persistent cough and syncope with cough today. Recent flu diagnosis. EXAM: CHEST  2 VIEW COMPARISON:  Radiographs 04/18/2014 FINDINGS: Scattered subsegmental atelectasis in both lungs. The cardiomediastinal contours are normal. The lungs are clear. Pulmonary vasculature is normal. No confluent consolidation, pleural effusion, or pneumothorax. No  acute osseous abnormalities are seen. IMPRESSION: Scattered subsegmental atelectasis in both lungs. No other acute abnormality. Electronically Signed   By: Rubye OaksMelanie  Ehinger M.D.   On: 10/13/2017 22:23   Ct Head Wo Contrast  Result Date: 10/13/2017 CLINICAL DATA:  Hit head on coffee table, bruising to the left eye syncopal episode EXAM: CT HEAD WITHOUT CONTRAST CT CERVICAL SPINE WITHOUT CONTRAST TECHNIQUE: Multidetector CT imaging of the head and cervical spine was performed following the standard protocol without intravenous contrast. Multiplanar CT image reconstructions of the cervical spine were also generated. COMPARISON:  None. FINDINGS: CT HEAD FINDINGS Brain: No acute territorial infarction, hemorrhage, or intracranial mass is visualized. Mild small vessel ischemic changes of the white matter. Mild atrophy. Normal ventricle size Vascular: No hyperdense vessels.  Carotid artery calcification. Skull: No fracture Sinuses/Orbits: Mucosal thickening in the ethmoid and maxillary sinuses. No acute orbital abnormality Other: Mild left periorbital soft tissue swelling CT CERVICAL SPINE FINDINGS Alignment: Trace anterolisthesis of C3 on C4. Straightening of the cervical spine. Facet alignment is within normal  limits. Skull base and vertebrae: No acute fracture. No primary bone lesion or focal pathologic process. Soft tissues and spinal canal: No prevertebral fluid or swelling. No visible canal hematoma. Disc levels: Moderate degenerative changes at C4-C5, C5-C6 and C6-C7. Multiple bilateral facet arthropathy. Multiple level bilateral foraminal stenosis, left greater than right. Upper chest: Lung apices are clear. Other: None IMPRESSION: 1. No CT evidence for acute intracranial abnormality. 2. Trace anterolisthesis of C3 on C4 with degenerative changes. No acute fracture is seen. Electronically Signed   By: Jasmine PangKim  Fujinaga M.D.   On: 10/13/2017 22:28   Ct Cervical Spine Wo Contrast  Result Date: 10/13/2017 CLINICAL DATA:  Hit head on coffee table, bruising to the left eye syncopal episode EXAM: CT HEAD WITHOUT CONTRAST CT CERVICAL SPINE WITHOUT CONTRAST TECHNIQUE: Multidetector CT imaging of the head and cervical spine was performed following the standard protocol without intravenous contrast. Multiplanar CT image reconstructions of the cervical spine were also generated. COMPARISON:  None. FINDINGS: CT HEAD FINDINGS Brain: No acute territorial infarction, hemorrhage, or intracranial mass is visualized. Mild small vessel ischemic changes of the white matter. Mild atrophy. Normal ventricle size Vascular: No hyperdense vessels.  Carotid artery calcification. Skull: No fracture Sinuses/Orbits: Mucosal thickening in the ethmoid and maxillary sinuses. No acute orbital abnormality Other: Mild left periorbital soft tissue swelling CT CERVICAL SPINE FINDINGS Alignment: Trace anterolisthesis of C3 on C4. Straightening of the cervical spine. Facet alignment is within normal limits. Skull base and vertebrae: No acute fracture. No primary bone lesion or focal pathologic process. Soft tissues and spinal canal: No prevertebral fluid or swelling. No visible canal hematoma. Disc levels: Moderate degenerative changes at C4-C5, C5-C6  and C6-C7. Multiple bilateral facet arthropathy. Multiple level bilateral foraminal stenosis, left greater than right. Upper chest: Lung apices are clear. Other: None IMPRESSION: 1. No CT evidence for acute intracranial abnormality. 2. Trace anterolisthesis of C3 on C4 with degenerative changes. No acute fracture is seen. Electronically Signed   By: Jasmine PangKim  Fujinaga M.D.   On: 10/13/2017 22:28    Procedures .Marland Kitchen.Laceration Repair Date/Time: 10/13/2017 11:20 PM Performed by: Lavera GuiseLiu, Jenean Escandon Duo, MD Authorized by: Lavera GuiseLiu, Jarry Manon Duo, MD   Consent:    Consent obtained:  Verbal   Consent given by:  Patient   Risks discussed:  Infection, pain, poor cosmetic result, poor wound healing and need for additional repair   Alternatives discussed:  No treatment Anesthesia (see MAR for exact dosages):  Anesthesia method:  Local infiltration   Local anesthetic:  Lidocaine 2% w/o epi Laceration details:    Location:  Face   Face location:  Forehead   Length (cm):  1.5 Repair type:    Repair type:  Simple Pre-procedure details:    Preparation:  Patient was prepped and draped in usual sterile fashion Exploration:    Hemostasis achieved with:  Direct pressure   Wound exploration: entire depth of wound probed and visualized     Contaminated: no   Treatment:    Area cleansed with:  Betadine   Amount of cleaning:  Standard   Irrigation solution:  Sterile saline   Irrigation volume:  500 mL   Irrigation method:  Syringe   Visualized foreign bodies/material removed: no   Skin repair:    Repair method:  Sutures   Suture size:  6-0   Suture material:  Nylon   Suture technique:  Simple interrupted   Number of sutures:  4 Approximation:    Approximation:  Close   Vermilion border: well-aligned   Post-procedure details:    Dressing:  Antibiotic ointment and non-adherent dressing   Patient tolerance of procedure:  Tolerated well, no immediate complications   (including critical care time)  Medications Ordered  in ED Medications  lidocaine-EPINEPHrine (XYLOCAINE W/EPI) 2 %-1:100000 (with pres) injection 20 mL (not administered)  bacitracin ointment (not administered)     Initial Impression / Assessment and Plan / ED Course  I have reviewed the triage vital signs and the nursing notes.  Pertinent labs & imaging results that were available during my care of the patient were reviewed by me and considered in my medical decision making (see chart for details).     71 year old male who presents with syncope and fall.  Syncope related to coughing spell. Do not suspect cardiogenic or neurogenic cause or other serious cause. Blood work overall reassuring. EKG w/ PVC but no ischeia or signs of heart strain. CT head and cspine visualized and shows no acute traumatic injuries.  Chest x-ray without pneumonia or other acute cardiopulmonary processes.  He does have a laceration between his eyebrows, which is repaired at bedside.  He is felt stable for discharge home. Wound care instructions reviewed. Strict return and follow-up instructions reviewed. He expressed understanding of all discharge instructions and felt comfortable with the plan of care.   Final Clinical Impressions(s) / ED Diagnoses   Final diagnoses:  Syncope and collapse  Facial laceration, initial encounter    ED Discharge Orders    None       Lavera GuiseLiu, Raydan Schlabach Duo, MD 10/13/17 2321

## 2018-01-26 ENCOUNTER — Encounter: Payer: Self-pay | Admitting: Cardiovascular Disease

## 2018-01-26 ENCOUNTER — Ambulatory Visit: Payer: Medicare HMO | Admitting: Cardiovascular Disease

## 2018-01-26 VITALS — BP 132/84 | HR 59 | Ht 69.0 in | Wt 216.5 lb

## 2018-01-26 DIAGNOSIS — I493 Ventricular premature depolarization: Secondary | ICD-10-CM

## 2018-01-26 DIAGNOSIS — I10 Essential (primary) hypertension: Secondary | ICD-10-CM

## 2018-01-26 MED ORDER — CARVEDILOL 6.25 MG PO TABS: 6.2500 mg | ORAL_TABLET | Freq: Two times a day (BID) | ORAL | 3 refills | Status: DC

## 2018-01-26 MED ORDER — LOSARTAN POTASSIUM-HCTZ 100-25 MG PO TABS: 1.0000 | ORAL_TABLET | Freq: Every day | ORAL | 3 refills | Status: DC

## 2018-01-26 NOTE — Patient Instructions (Signed)
Medication Instructions: Continue same medications.   Labwork: None.   Procedures/Testing: None.   Follow-Up: 1 year with Dr. Arida.   Any Additional Special Instructions Will Be Listed Below (If Applicable).     If you need a refill on your cardiac medications before your next appointment, please call your pharmacy.   

## 2018-01-26 NOTE — Progress Notes (Signed)
Cardiology Office Note   Date:  01/26/2018   ID:  Terry Vega Grine, DOB 04/08/1946, MRN [id number]  PCP:  [name], [name], MD  Cardiologist:   Lorine BearsMuhammad Arida, MD   Chief Complaint  Patient presents with  . OTHER    Syncope/flu no complaints today. Meds reviewed verbally with pt.      History of Present Illness: Terry Vega Terry Vega is a 72 y.o. male who presents for a followup visit regarding  PVCs and lower extremity edema. He has prolonged history of hypertension. He has hypothyroidism and has been on replacement therapy for many years.  He was seen in 2014 for unifocal PVCs. He had an echocardiogram done in December of 2014 which showed normal LV systolic function, mildly dilated left atrium and mild aortic regurgitation. Nuclear stress test showed no evidence of ischemia. He had worsening leg edema and diltiazem which improved after switching to carvedilol.   He had the flu in November and took Tamiflu.  He was having severe episodes of cough and had a syncopal episode after prolonged coughing.  He went to the emergency room due to laceration.  No other significant injuries.  He has been doing well since then with no chest pain, shortness of breath or palpitations.  Blood pressure has been controlled.  He had leg edema last year that resolved completely.  Past Medical History:  Diagnosis Date  . Allergy   . Heart murmur   . Hypertension   . Thyroid disease     Past Surgical History:  Procedure Laterality Date  . COLONOSCOPY    . HEMORRHOID SURGERY    . VASECTOMY       Current Outpatient Medications  Medication Sig Dispense Refill  . aspirin 81 MG tablet Take 81 mg by mouth daily.    . benzonatate (TESSALON) 100 MG capsule Take 1-2 capsules (100-200 mg total) by mouth 3 (three) times daily as needed. 30 capsule 0  . carvedilol (COREG) 6.25 MG tablet TAKE 1 TABLET (6.25 MG TOTAL) BY MOUTH 2 (TWO) TIMES DAILY. 180 tablet 2  . cholecalciferol (VITAMIN D) 1000 UNITS  tablet Take 1,000 Units by mouth daily.    . Cyanocobalamin (VITAMIN B12 PO) Take 1,000 mg by mouth 2 (two) times daily.     Marland Kitchen. levothyroxine (SYNTHROID, LEVOTHROID) 100 MCG tablet Take 1 tablet (100 mcg total) by mouth daily. 90 tablet 3  . loratadine (CLARITIN) 10 MG tablet Take 10 mg by mouth daily as needed.     Marland Kitchen. losartan-hydrochlorothiazide (HYZAAR) 100-25 MG tablet Take 1 tablet by mouth daily. 90 tablet 3  . Multiple Vitamin (MULTIVITAMIN) tablet Take 1 tablet by mouth daily.    . Naproxen Sodium (ALEVE PO) Take by mouth as needed.    Marland Kitchen. oseltamivir (TAMIFLU) 75 MG capsule Take 1 capsule (75 mg total) by mouth 2 (two) times daily. 10 capsule 0  . vitamin E 1000 UNIT capsule Take 1,000 Units by mouth daily.    Marland Kitchen. Zoster Vaccine Live, PF, (ZOSTAVAX) [id number] UNT/0.65ML injection Inject 19,400 Units into the skin once. 1 each 0   No current facility-administered medications for this visit.     Allergies:   Patient has no known allergies.    Social History:  The patient  reports that  has never smoked. he has never used smokeless tobacco. He reports that he drinks alcohol. He reports that he does not use drugs.   Family History:  The patient's family history includes Cancer in his father;  Diabetes in his father and maternal grandmother; Heart disease in his mother; Stroke in his maternal grandmother.    ROS:  Please see the history of present illness.   Otherwise, review of systems are positive for none.   All other systems are reviewed and negative.    PHYSICAL EXAM: VS:  BP 132/84 (BP Location: Left Arm, Patient Position: Sitting, Cuff Size: Normal)   Pulse (!) 59   Ht 5\' 9"  (1.753 m)   Wt 216 lb 8 oz (98.2 kg)   BMI 31.97 kg/m  , BMI Body mass index is 31.97 kg/m. GEN: Well nourished, well developed, in no acute distress  HEENT: normal  Neck: no JVD, carotid bruits, or masses Cardiac: RRR; no murmurs, rubs, or gallops,no edema  Respiratory:  clear to auscultation bilaterally,  normal work of breathing GI: soft, nontender, nondistended, + BS MS: no deformity or atrophy  Skin: warm and dry, no rash Neuro:  Strength and sensation are intact Psych: euthymic mood, full affect   EKG:  EKG is ordered today. The ekg ordered today demonstrates sinus bradycardia with no significant ST or T wave changes   Recent Labs: 04/20/2017: ALT 16; TSH 5.360 10/13/2017: BUN 20; Creatinine, Ser 0.90; Hemoglobin 12.3; Platelets 162; Potassium 3.3; Sodium 132    Lipid Panel    Component Value Date/Time   CHOL 171 04/20/2017 1540   TRIG 114 04/20/2017 1540   HDL 42 04/20/2017 1540   CHOLHDL 4.1 04/20/2017 1540   CHOLHDL 4.2 03/17/2016 0814   VLDL 19 03/17/2016 0814   LDLCALC 106 (H) 04/20/2017 1540      Wt Readings from Last 3 Encounters:  01/26/18 216 lb 8 oz (98.2 kg)  10/13/17 215 lb (97.5 kg)  10/12/17 215 lb 9.6 oz (97.8 kg)     No flowsheet data found.    ASSESSMENT AND PLAN:  1.  PVCs: He is not having any palpitations. No PVCs on EKG or by physical exam. Continue treatment with carvedilol.  2. Essential hypertension: Blood pressure is well controlled on current medications.  He did have mild hypokalemia and hyponatremia in November in the setting of the flu.  I recommend repeating these labs in the near future which can be done with his physical.  3.  Syncope in November: Likely vasovagal in the setting of cough.   Disposition:   FU with me in 1 year  Signed,  Lorine Bears, MD  01/26/2018 11:09 AM    [facility]

## 2018-02-14 ENCOUNTER — Ambulatory Visit: Payer: Self-pay

## 2018-02-14 NOTE — Telephone Encounter (Signed)
Pt c/o left upper side and under left shoulder blade. Rates the pain as moderate, intermittent  and states that pain  Is worse when he turns a certain way or when he takes a deep breath. Denies chest pain or radiation down either arm. Pt thinks it is from coughing after still recovering from the flu. Appt made with PCP tomorrow 02/15/18. Reason for Disposition . [1] MODERATE back pain (e.g., interferes with normal activities) AND [2] present > 3 days  Answer Assessment - Initial Assessment Questions 1. ONSET: "When did the pain begin?"      4 days 2. LOCATION: "Where does it hurt?" (upper, mid or lower back)     Left upper side and under left shoulder blade 3. SEVERITY: "How bad is the pain?"  (e.g., Scale 1-10; mild, moderate, or severe)   - MILD (1-3): doesn't interfere with normal activities    - MODERATE (4-7): interferes with normal activities or awakens from sleep    - SEVERE (8-10): excruciating pain, unable to do any normal activities      moderate 4. PATTERN: "Is the pain constant?" (e.g., yes, no; constant, intermittent)      No just when he turns certain ways and when he takes a deep breath 5. RADIATION: "Does the pain shoot into your legs or elsewhere?"     no 6. CAUSE:  "What do you think is causing the back pain?"      Had flu and may have had pna to the left lung 7. BACK OVERUSE:  "Any recent lifting of heavy objects, strenuous work or exercise?"     Not recntly 8. MEDICATIONS: "What have you taken so far for the pain?" (e.g., nothing, acetaminophen, NSAIDS)     alieve gets some relief  9. NEUROLOGIC SYMPTOMS: "Do you have any weakness, numbness, or problems with bowel/bladder control?"     no 10. OTHER SYMPTOMS: "Do you have any other symptoms?" (e.g., fever, abdominal pain, burning with urination, blood in urine)       Coughing from allergies 11. PREGNANCY: "Is there any chance you are pregnant?" (e.g., yes, no; LMP)       n/a  Protocols used: BACK PAIN-A-AH

## 2018-02-15 ENCOUNTER — Other Ambulatory Visit: Payer: Self-pay

## 2018-02-15 ENCOUNTER — Ambulatory Visit (INDEPENDENT_AMBULATORY_CARE_PROVIDER_SITE_OTHER): Payer: Medicare HMO

## 2018-02-15 ENCOUNTER — Encounter: Payer: Self-pay | Admitting: Family Medicine

## 2018-02-15 ENCOUNTER — Ambulatory Visit (INDEPENDENT_AMBULATORY_CARE_PROVIDER_SITE_OTHER): Payer: Medicare HMO | Admitting: Family Medicine

## 2018-02-15 VITALS — BP 136/80 | HR 64 | Temp 98.3°F | Ht 69.0 in | Wt 217.0 lb

## 2018-02-15 DIAGNOSIS — R0789 Other chest pain: Secondary | ICD-10-CM | POA: Diagnosis not present

## 2018-02-15 DIAGNOSIS — R05 Cough: Secondary | ICD-10-CM

## 2018-02-15 DIAGNOSIS — R0781 Pleurodynia: Secondary | ICD-10-CM

## 2018-02-15 DIAGNOSIS — R059 Cough, unspecified: Secondary | ICD-10-CM

## 2018-02-15 DIAGNOSIS — R0989 Other specified symptoms and signs involving the circulatory and respiratory systems: Secondary | ICD-10-CM | POA: Diagnosis not present

## 2018-02-15 MED ORDER — MELOXICAM 7.5 MG PO TABS: 7.5000 mg | ORAL_TABLET | Freq: Every day | ORAL | 0 refills | Status: DC

## 2018-02-15 NOTE — Progress Notes (Signed)
Subjective:    Patient ID: Terry Vega, male    DOB: 04/26/1946, 72 y.o.   MRN: [id number]  HPI Terry Vega is a 72 y.o. male Presents today for: Chief Complaint  Patient presents with  . Left shoulder Blade Pain    Hurts off/on 2-3X weeks, sharp pain, triggers are moving and deep breathing    upper back on left side below shoulder blade past 2-3 weeks, off and on. Similar sx's years ago - diagnosed with a walking pneumonia.  No recent illness or fever. Occasional cough - dry cough, every few days. No chest pain, no heart palpitations. Not dyspneic, just hurts to take a deep breath at times. No neck pain. No known injury, but was bending/twisting to pick up sticks before this started.  No rash of affected area.  Drives to [employer] at times - 4 hrs, last trip in March [facility] [place] but stops with driving. No calf pain or swelling.   Had flu in November, had syncope with cough at that time only.   Tx : alleve - helps pain.   Patient Active Problem List   Diagnosis Date Noted  . Leg edema 05/06/2014  . PVC (premature ventricular contraction) 10/15/2013  . Hypertension 10/08/2012  . Hypothyroid 10/08/2012   Past Medical History:  Diagnosis Date  . Allergy   . Heart murmur   . Hypertension   . Thyroid disease    Past Surgical History:  Procedure Laterality Date  . COLONOSCOPY    . HEMORRHOID SURGERY    . VASECTOMY     No Known Allergies Prior to Admission medications   Medication Sig Start Date End Date Taking? Authorizing Provider  aspirin 81 MG tablet Take 81 mg by mouth daily.   Yes [provider]  carvedilol (COREG) 6.25 MG tablet Take 1 tablet (6.25 mg total) by mouth 2 (two) times daily. 01/26/18  Yes Iran Ouch, MD  cholecalciferol (VITAMIN D) 1000 UNITS tablet Take 1,000 Units by mouth daily.   Yes [provider]  Cyanocobalamin (VITAMIN B12 PO) Take 1,000 mg by mouth 2 (two) times daily.    Yes [provider]    levothyroxine (SYNTHROID, LEVOTHROID) 100 MCG tablet Take 1 tablet (100 mcg total) by mouth daily. 04/20/17  Yes Shade Flood, MD  loratadine (CLARITIN) 10 MG tablet Take 10 mg by mouth daily as needed.    Yes [provider]  losartan-hydrochlorothiazide (HYZAAR) 100-25 MG tablet Take 1 tablet by mouth daily. 01/26/18  Yes Iran Ouch, MD  Multiple Vitamin (MULTIVITAMIN) tablet Take 1 tablet by mouth daily.   Yes [provider]  Naproxen Sodium (ALEVE PO) Take by mouth as needed.   Yes [provider]  vitamin E 1000 UNIT capsule Take 1,000 Units by mouth daily.   Yes [provider]  Zoster Vaccine Live, PF, (ZOSTAVAX) 40981 UNT/0.65ML injection Inject 19,400 Units into the skin once. 03/17/16  Yes Collene Gobble, MD   Social History   Socioeconomic History  . Marital status: Married    Spouse name: Not on file  . Number of children: Not on file  . Years of education: Not on file  . Highest education level: Not on file  Occupational History  . Not on file  Social Needs  . Financial resource strain: Not on file  . Food insecurity:    Worry: Not on file    Inability: Not on file  . Transportation needs:  Medical: Not on file    Non-medical: Not on file  Tobacco Use  . Smoking status: Never Smoker  . Smokeless tobacco: Never Used  Substance and Sexual Activity  . Alcohol use: Yes    Comment: occasionally  . Drug use: No  . Sexual activity: Yes    Birth control/protection: None  Lifestyle  . Physical activity:    Days per week: Not on file    Minutes per session: Not on file  . Stress: Not on file  Relationships  . Social connections:    Talks on phone: Not on file    Gets together: Not on file    Attends religious service: Not on file    Active member of club or organization: Not on file    Attends meetings of clubs or organizations: Not on file    Relationship status: Not on file  . Intimate partner violence:    Fear of  current or ex partner: Not on file    Emotionally abused: Not on file    Physically abused: Not on file    Forced sexual activity: Not on file  Other Topics Concern  . Not on file  Social History Narrative  . Not on file    Review of Systems  Constitutional: Positive for fever. Negative for chills, diaphoresis and unexpected weight change.  Respiratory: Positive for cough. Negative for chest tightness, shortness of breath and wheezing.   Cardiovascular: Negative for chest pain, palpitations and leg swelling.  Musculoskeletal: Positive for arthralgias (left upper back as above. no calf pain/swelling. ).       Objective:   Physical Exam  Constitutional: He is oriented to person, place, and time. He appears well-developed and well-nourished.  HENT:  Head: Normocephalic and atraumatic.  Right Ear: Tympanic membrane, external ear and ear canal normal.  Left Ear: Tympanic membrane, external ear and ear canal normal.  Nose: No rhinorrhea.  Mouth/Throat: Oropharynx is clear and moist and mucous membranes are normal. No oropharyngeal exudate or posterior oropharyngeal erythema.  Eyes: Pupils are equal, round, and reactive to light. Conjunctivae are normal.  Neck: Neck supple.  Cardiovascular: Normal rate, regular rhythm, normal heart sounds and intact distal pulses.  No murmur heard. Pulmonary/Chest: Effort normal and breath sounds normal. He has no wheezes. He has no rhonchi. He has no rales. He exhibits no tenderness and no bony tenderness.  Unable to reproduce pain with palpation  Abdominal: Soft. There is no tenderness.  Lymphadenopathy:    He has no cervical adenopathy.  Neurological: He is alert and oriented to person, place, and time.  Skin: Skin is warm and dry. No rash noted.  Psychiatric: He has a normal mood and affect. His behavior is normal.  Vitals reviewed.  Vitals:   02/15/18 1244 02/15/18 1258  BP: (!) 154/82 136/80  Pulse: 64   Temp: 98.3 F (36.8 C)   TempSrc:  Oral   SpO2: 93%   Weight: 217 lb (98.4 kg)   Height: 5\' 9"  (1.753 m)    Dg Chest 2 View  Result Date: 02/15/2018 CLINICAL DATA:  Episodic left shoulder blade pain. EXAM: CHEST - 2 VIEW COMPARISON:  10/13/2017 FINDINGS: Normal heart size and mediastinal contours. No acute infiltrate or edema. Linear density overlapping the posterior heart in the lateral projection is likely fissural fat, stable. No effusion or pneumothorax. No acute osseous findings. Hypoplastic first/cervical ribs. History of left shoulder pain with left glenohumeral spurring and subcoracoid calcified body. IMPRESSION: 1. No evidence of  active cardiopulmonary disease. 2. History of left shoulder pain. There is left glenohumeral degenerative spurring and a probable subcoracoid articular body. Electronically Signed   By: Marnee SpringJonathon  Watts M.D.   On: 02/15/2018 13:34        Assessment & Plan:   Murlean HarkRussell L Dossett is a 72 y.o. male Chest wall pain - Plan: DG Chest 2 View, meloxicam (MOBIC) 7.5 MG tablet  Cough - Plan: DG Chest 2 View, meloxicam (MOBIC) 7.5 MG tablet  Pleuritic pain - Plan: D-dimer, quantitative ([facility])  D-dimer obtained which was reassuring.  Previous work outside and yard, likely upper back strain/MSK cause.  Chest x-ray reassuring, and glenohumeral abnormalities discussed.  -Mobic 7.5 mg daily as needed.  -Relative rest, symptomatic care.  -Recheck 1 to 2 weeks if not improving, sooner if worse.  Consider Ortho eval if reproduction of symptoms with shoulder movement.  Meds ordered this encounter  Medications  . meloxicam (MOBIC) 7.5 MG tablet    Sig: Take 1 tablet (7.5 mg total) by mouth daily.    Dispense:  15 tablet    Refill:  0   Patient Instructions    X-ray does not indicate any pneumonia or apparent acute issues.  They did note some issues at the left shoulder joint, but less likely cause of your current symptoms. I will check blood clot test with your history of travel, but that is less  likely cause.  Try meloxicam once per day and if not improving in the next 1-2 weeks, return for recheck.  We may also want to have you seen by orthopedist if movement of that shoulder reproduces those symptoms.  Return to the clinic or go to the nearest emergency room if any of your symptoms worsen or new symptoms occur.  Thank you for coming in today.   IF you received an x-ray today, you will receive an invoice from [facility]. Please contact [facility] at [phone] with questions or concerns regarding your invoice.   IF you received labwork today, you will receive an invoice from [facility]. Please contact [facility] at [phone] with questions or concerns regarding your invoice.   Our billing staff will not be able to assist you with questions regarding bills from these companies.  You will be contacted with the lab results as soon as they are available. The fastest way to get your results is to activate your My Chart account. Instructions are located on the last page of this paperwork. If you have not heard from us regarding the results in 2 weeks, please contact this office.       Signed,   Meredith StaggersJeffrey Hinton Luellen, MD [facility].  02/17/18 10:13 PM

## 2018-02-15 NOTE — Patient Instructions (Addendum)
  X-ray does not indicate any pneumonia or apparent acute issues.  They did note some issues at the left shoulder joint, but less likely cause of your current symptoms. I will check blood clot test with your history of travel, but that is less likely cause.  Try meloxicam once per day and if not improving in the next 1-2 weeks, return for recheck.  We may also want to have you seen by orthopedist if movement of that shoulder reproduces those symptoms.  Return to the clinic or go to the nearest emergency room if any of your symptoms worsen or new symptoms occur.  Thank you for coming in today.   IF you received an x-ray today, you will receive an invoice from [facility]. Please contact [facility] at [phone] with questions or concerns regarding your invoice.   IF you received labwork today, you will receive an invoice from [facility]. Please contact [facility] at [phone] with questions or concerns regarding your invoice.   Our billing staff will not be able to assist you with questions regarding bills from these companies.  You will be contacted with the lab results as soon as they are available. The fastest way to get your results is to activate your My Chart account. Instructions are located on the last page of this paperwork. If you have not heard from us regarding the results in 2 weeks, please contact this office.

## 2018-02-16 LAB — D-DIMER, QUANTITATIVE (NOT AT ARMC): D-DIMER: 0.25 mg{FEU}/L (ref 0.00–0.49)

## 2018-04-23 ENCOUNTER — Ambulatory Visit (INDEPENDENT_AMBULATORY_CARE_PROVIDER_SITE_OTHER): Payer: Medicare HMO | Admitting: Family Medicine

## 2018-04-23 ENCOUNTER — Encounter: Payer: Self-pay | Admitting: Family Medicine

## 2018-04-23 ENCOUNTER — Other Ambulatory Visit: Payer: Self-pay

## 2018-04-23 VITALS — BP 126/78 | HR 69 | Temp 98.1°F | Ht 69.0 in | Wt 216.0 lb

## 2018-04-23 DIAGNOSIS — Z125 Encounter for screening for malignant neoplasm of prostate: Secondary | ICD-10-CM

## 2018-04-23 DIAGNOSIS — I10 Essential (primary) hypertension: Secondary | ICD-10-CM | POA: Diagnosis not present

## 2018-04-23 DIAGNOSIS — E785 Hyperlipidemia, unspecified: Secondary | ICD-10-CM

## 2018-04-23 DIAGNOSIS — Z0000 Encounter for general adult medical examination without abnormal findings: Secondary | ICD-10-CM

## 2018-04-23 DIAGNOSIS — E039 Hypothyroidism, unspecified: Secondary | ICD-10-CM | POA: Diagnosis not present

## 2018-04-23 MED ORDER — LEVOTHYROXINE SODIUM 100 MCG PO TABS: 100.0000 ug | ORAL_TABLET | Freq: Every day | ORAL | 3 refills | Status: DC

## 2018-04-23 NOTE — Progress Notes (Signed)
Subjective:  By signing my name below, I, Terry Vega, attest that this documentation has been prepared under the direction and in the presence of [name], MD. Electronically Signed: Moises Vega, [place]. 04/23/2018 , 8:25 AM .  Patient was seen in Room 10 .   Patient ID: Terry Vega, male    DOB: 1946/11/11, 72 y.o.   MRN: [id number] Chief Complaint  Patient presents with  . Annual Exam    Pt states he is doing well    HPI Terry Vega is a 72 y.o. male Here for annual physical. He has a history of HTN, and hypothyroidism. He was treated for chest wall pain in April.   HTN History of PVC's, seen by Dr. Fletcher Anon on March 15th without PVC's at that time. He was continued on carvedilol 6.25 mg bid and Losartan-HCT 100-25 mg qd. He denies any side effects with his medications.   Lipid screening He had a mildly elevated LDL in 2018.   Lab Results  Component Value Date   CHOL 171 04/20/2017   HDL 42 04/20/2017   LDLCALC 106 (H) 04/20/2017   TRIG 114 04/20/2017   CHOLHDL 4.1 04/20/2017    Hypothyroidism Lab Results  Component Value Date   TSH 5.360 (H) 04/20/2017   Recommended repeat thyroid testing in 3-6 months, which hasn't been done since March 2018. Synthroid 100 mcg qd.   Cancer Screening Colonoscopy: last done on 05/06/15, by Dr. Olevia Perches; diverticulosis otherwise normal, repeat 10 years.  Prostate cancer screening: PSA normal in May 2017.   Immunizations Immunization History  Administered Date(s) Administered  . Influenza Split 08/13/2014  . Influenza, High Dose Seasonal PF 08/29/2017  . Influenza,inj,Quad PF,6+ Mos 10/09/2013  . Influenza-Unspecified 08/24/2016  . Pneumococcal Conjugate-13 09/11/2014  . Pneumococcal Polysaccharide-23 04/20/2017  . Td 12/23/2008  . Zoster 04/03/2016, 07/13/2016   Falls screening: none in past year.   Depression Depression screen [facility] 2/9 04/23/2018 02/15/2018 10/12/2017 06/26/2017 06/23/2017  Decreased Interest 0  0 0 0 0  Down, Depressed, Hopeless 0 0 0 0 0  PHQ - 2 Score 0 0 0 0 0    Vision  Visual Acuity Screening   Right eye Left eye Both eyes  Without correction:     With correction: 20/30 20/40 -1 20/25 -1   He hasn't seen his eye doctor in a year.   Dentist Followed by dentist, seen within last 6 months; next appointment coming up in July.   Exercise He walks for exercising a couple times a week. He denies any chest pain or chest tightness with exertion or activity.   Functional Status Survey Is the patient deaf or have difficulty hearing?: Yes(Pt does wear hearing aids) Does the patient have difficulty seeing, even when wearing glasses/contacts?: Yes(Does have difficulty with reading, wears readers) Does the patient have difficulty concentrating, remembering, or making decisions?: No Does the patient have difficulty walking or climbing stairs?: No Does the patient have difficulty dressing or bathing?: No Does the patient have difficulty doing errands alone such as visiting a doctor's office or shopping?: No  Mental Status Survey 6CIT Screen 04/23/2018  What Year? 0 points  What month? 0 points  What time? 0 points  Count back from 20 0 points  Months in reverse 0 points  Repeat phrase 0 points  Total Score 0     Advanced Directives He has brought in copies of his advanced directives.    Patient Active Problem List   Diagnosis Date Noted  .  Leg edema 05/06/2014  . PVC (premature ventricular contraction) 10/15/2013  . Hypertension 10/08/2012  . Hypothyroid 10/08/2012   Past Medical History:  Diagnosis Date  . Allergy   . Heart murmur   . Hypertension   . Thyroid disease    Past Surgical History:  Procedure Laterality Date  . COLONOSCOPY    . HEMORRHOID SURGERY    . VASECTOMY     No Known Allergies Prior to Admission medications   Medication Sig Start Date End Date Taking? Authorizing Provider  aspirin 81 MG tablet Take 81 mg by mouth daily.   Yes [provider]  carvedilol (COREG) 6.25 MG tablet Take 1 tablet (6.25 mg total) by mouth 2 (two) times daily. 01/26/18  Yes Wellington Hampshire, MD  cholecalciferol (VITAMIN D) 1000 UNITS tablet Take 1,000 Units by mouth daily.   Yes [provider]  Cyanocobalamin (VITAMIN B12 PO) Take 1,000 mg by mouth 2 (two) times daily.    Yes [provider]  levothyroxine (SYNTHROID, LEVOTHROID) 100 MCG tablet Take 1 tablet (100 mcg total) by mouth daily. 04/20/17  Yes Wendie Agreste, MD  loratadine (CLARITIN) 10 MG tablet Take 10 mg by mouth daily as needed.    Yes [provider]  losartan-hydrochlorothiazide (HYZAAR) 100-25 MG tablet Take 1 tablet by mouth daily. 01/26/18  Yes Wellington Hampshire, MD  meloxicam (MOBIC) 7.5 MG tablet Take 1 tablet (7.5 mg total) by mouth daily. 02/15/18  Yes Wendie Agreste, MD  Multiple Vitamin (MULTIVITAMIN) tablet Take 1 tablet by mouth daily.   Yes [provider]  Naproxen Sodium (ALEVE PO) Take by mouth as needed.   Yes [provider]  vitamin E 1000 UNIT capsule Take 1,000 Units by mouth daily.   Yes [provider]   Social History   Socioeconomic History  . Marital status: Married    Spouse name: Not on file  . Number of children: Not on file  . Years of education: Not on file  . Highest education level: Not on file  Occupational History  . Not on file  Social Needs  . Financial resource strain: Not on file  . Food insecurity:    Worry: Not on file    Inability: Not on file  . Transportation needs:    Medical: Not on file    Non-medical: Not on file  Tobacco Use  . Smoking status: Never Smoker  . Smokeless tobacco: Never Used  Substance and Sexual Activity  . Alcohol use: Yes    Comment: occasionally  . Drug use: No  . Sexual activity: Yes    Birth control/protection: None  Lifestyle  . Physical activity:    Days per week: Not on file    Minutes per session: Not on file  . Stress: Not on  file  Relationships  . Social connections:    Talks on phone: Not on file    Gets together: Not on file    Attends religious service: Not on file    Active member of club or organization: Not on file    Attends meetings of clubs or organizations: Not on file    Relationship status: Not on file  . Intimate partner violence:    Fear of current or ex partner: Not on file    Emotionally abused: Not on file    Physically abused: Not on file    Forced sexual activity: Not on file  Other Topics Concern  . Not on file  Social History Narrative  . Not on file   Review of Systems 13 point ROS - negative     Objective:   Physical Exam  Constitutional: He is oriented to person, place, and time. He appears well-developed and well-nourished.  HENT:  Head: Normocephalic and atraumatic.  Right Ear: External ear normal.  Left Ear: External ear normal.  Mouth/Throat: Oropharynx is clear and moist.  Eyes: Pupils are equal, round, and reactive to light. Conjunctivae and EOM are normal.  Neck: Normal range of motion. Neck supple. No thyromegaly present.  Cardiovascular: Normal rate, regular rhythm, normal heart sounds and intact distal pulses.  Pulmonary/Chest: Effort normal and breath sounds normal. No respiratory distress. He has no wheezes.  Abdominal: Soft. He exhibits no distension. There is no tenderness. Hernia confirmed negative in the right inguinal area and confirmed negative in the left inguinal area.  Genitourinary: Prostate normal.  Musculoskeletal: Normal range of motion. He exhibits edema (trace pedal edema at the ankles). He exhibits no tenderness.  Lymphadenopathy:    He has no cervical adenopathy.  Neurological: He is alert and oriented to person, place, and time. He has normal reflexes.  Skin: Skin is warm and dry.  Psychiatric: He has a normal mood and affect. His behavior is normal.  Vitals reviewed.   Vitals:   04/23/18 0807  BP: 126/78  Pulse: 69  Temp: 98.1 F  (36.7 C)  TempSrc: Oral  SpO2: 96%  Weight: 216 lb (98 kg)  Height: 5' 9"  (1.753 m)      Assessment & Plan:   DAREON NUNZIATO is a 72 y.o. male Medicare annual wellness visit, subsequent  - anticipatory guidance as below in AVS, screening labs if needed. Health maintenance items as above in HPI discussed/recommended as applicable.   - no concerning responses on depression, fall, or functional status screening. Any positive responses noted as above. Advanced directives discussed as in CHL.   Hyperlipidemia, unspecified hyperlipidemia type - Plan: Comprehensive metabolic panel, Lipid panel  - mild elevation prior. With hx of HTN, will repeat testing. No meds recommended at this time.   Essential hypertension - Plan: Comprehensive metabolic panel  -Stable/controlled.  No recent PVCs.  Continue same dose of medications, recently refilled by cardiology  Hypothyroidism, unspecified type - Plan: TSH, levothyroxine (SYNTHROID, LEVOTHROID) 100 MCG tablet  -Mild/borderline elevated TSH prior.  Repeat TSH, continue same dose Synthroid for now, recheck 6 months  Screening for prostate cancer - Plan: PSA  -We discussed pros and cons of prostate cancer screening, and after this discussion, he chose to have screening done. PSA obtained, and no concerning findings on DRE.    Meds ordered this encounter  Medications  . levothyroxine (SYNTHROID, LEVOTHROID) 100 MCG tablet    Sig: Take 1 tablet (100 mcg total) by mouth daily.    Dispense:  90 tablet    Refill:  3   Patient Instructions   No concerns based on my exam today.  See recommendations below.  Continue same dose of medications and recheck in 6 months for thyroid testing. Thanks for coming in today.    Preventive Care 55 Years and Older, Male Preventive care refers to lifestyle choices and visits with your health care provider that can promote health and wellness. What does preventive care include?  A yearly physical exam. This is  also called an annual well check.  Dental exams once or twice a year.  Routine eye exams. Ask your health care provider how often you should have  your eyes checked.  Personal lifestyle choices, including: ? Daily care of your teeth and gums. ? Regular physical activity. ? Eating a healthy diet. ? Avoiding tobacco and drug use. ? Limiting alcohol use. ? Practicing safe sex. ? Taking low doses of aspirin every day. ? Taking vitamin and mineral supplements as recommended by your health care provider. What happens during an annual well check? The services and screenings done by your health care provider during your annual well check will depend on your age, overall health, lifestyle risk factors, and family history of disease. Counseling Your health care provider may ask you questions about your:  Alcohol use.  Tobacco use.  Drug use.  Emotional well-being.  Home and relationship well-being.  Sexual activity.  Eating habits.  History of falls.  Memory and ability to understand (cognition).  Work and work Statistician.  Screening You may have the following tests or measurements:  Height, weight, and BMI.  Vega pressure.  Lipid and cholesterol levels. These may be checked every 5 years, or more frequently if you are over 75 years old.  Skin check.  Lung cancer screening. You may have this screening every year starting at age 63 if you have a 30-pack-year history of smoking and currently smoke or have quit within the past 15 years.  Fecal occult Vega test (FOBT) of the stool. You may have this test every year starting at age 57.  Flexible sigmoidoscopy or colonoscopy. You may have a sigmoidoscopy every 5 years or a colonoscopy every 10 years starting at age 51.  Prostate cancer screening. Recommendations will vary depending on your family history and other risks.  Hepatitis C Vega test.  Hepatitis B Vega test.  Sexually transmitted disease (STD)  testing.  Diabetes screening. This is done by checking your Vega sugar (glucose) after you have not eaten for a while (fasting). You may have this done every 1-3 years.  Abdominal aortic aneurysm (AAA) screening. You may need this if you are a current or former smoker.  Osteoporosis. You may be screened starting at age 9 if you are at high risk.  Talk with your health care provider about your test results, treatment options, and if necessary, the need for more tests. Vaccines Your health care provider may recommend certain vaccines, such as:  Influenza vaccine. This is recommended every year.  Tetanus, diphtheria, and acellular pertussis (Tdap, Td) vaccine. You may need a Td booster every 10 years.  Varicella vaccine. You may need this if you have not been vaccinated.  Zoster vaccine. You may need this after age 68.  Measles, mumps, and rubella (MMR) vaccine. You may need at least one dose of MMR if you were born in 1957 or later. You may also need a second dose.  Pneumococcal 13-valent conjugate (PCV13) vaccine. One dose is recommended after age 16.  Pneumococcal polysaccharide (PPSV23) vaccine. One dose is recommended after age 55.  Meningococcal vaccine. You may need this if you have certain conditions.  Hepatitis A vaccine. You may need this if you have certain conditions or if you travel or work in places where you may be exposed to hepatitis A.  Hepatitis B vaccine. You may need this if you have certain conditions or if you travel or work in places where you may be exposed to hepatitis B.  Haemophilus influenzae type b (Hib) vaccine. You may need this if you have certain risk factors.  Talk to your health care provider about which screenings and vaccines you need  and how often you need them. This information is not intended to replace advice given to you by your health care provider. Make sure you discuss any questions you have with your health care provider. Document  Released: 11/27/2015 Document Revised: 07/20/2016 Document Reviewed: 09/01/2015 Elsevier Interactive Patient Education  2018 [employer].    IF you received an x-ray today, you will receive an invoice from [facility]. Please contact [facility] at [phone] with questions or concerns regarding your invoice.   IF you received labwork today, you will receive an invoice from [place]. Please contact [facility] at [phone] with questions or concerns regarding your invoice.   Our billing staff will not be able to assist you with questions regarding bills from these companies.  You will be contacted with the lab results as soon as they are available. The fastest way to get your results is to activate your My Chart account. Instructions are located on the last page of this paperwork. If you have not heard from [place] regarding the results in 2 weeks, please contact this office.       I personally performed the services described in this documentation, which was scribed in my presence. The recorded information has been reviewed and considered for accuracy and completeness, addended by me as needed, and agree with information above.  Signed,   [name], MD [facility] [place].  04/23/18 8:40 AM

## 2018-04-23 NOTE — Patient Instructions (Addendum)
No concerns based on my exam today.  See recommendations below.  Continue same dose of medications and recheck in 6 months for thyroid testing. Thanks for coming in today.    Preventive Care 65 Years and Older, Male Preventive care refers to lifestyle choices and visits with your health care provider that can promote health and wellness. What does preventive care include?  A yearly physical exam. This is also called an annual well check.  Dental exams once or twice a year.  Routine eye exams. Ask your health care provider how often you should have your eyes checked.  Personal lifestyle choices, including: ? Daily care of your teeth and gums. ? Regular physical activity. ? Eating a healthy diet. ? Avoiding tobacco and drug use. ? Limiting alcohol use. ? Practicing safe sex. ? Taking low doses of aspirin every day. ? Taking vitamin and mineral supplements as recommended by your health care provider. What happens during an annual well check? The services and screenings done by your health care provider during your annual well check will depend on your age, overall health, lifestyle risk factors, and family history of disease. Counseling Your health care provider may ask you questions about your:  Alcohol use.  Tobacco use.  Drug use.  Emotional well-being.  Home and relationship well-being.  Sexual activity.  Eating habits.  History of falls.  Memory and ability to understand (cognition).  Work and work Statistician.  Screening You may have the following tests or measurements:  Height, weight, and BMI.  Blood pressure.  Lipid and cholesterol levels. These may be checked every 5 years, or more frequently if you are over 63 years old.  Skin check.  Lung cancer screening. You may have this screening every year starting at age 57 if you have a 30-pack-year history of smoking and currently smoke or have quit within the past 15 years.  Fecal occult blood test (FOBT)  of the stool. You may have this test every year starting at age 26.  Flexible sigmoidoscopy or colonoscopy. You may have a sigmoidoscopy every 5 years or a colonoscopy every 10 years starting at age 89.  Prostate cancer screening. Recommendations will vary depending on your family history and other risks.  Hepatitis C blood test.  Hepatitis B blood test.  Sexually transmitted disease (STD) testing.  Diabetes screening. This is done by checking your blood sugar (glucose) after you have not eaten for a while (fasting). You may have this done every 1-3 years.  Abdominal aortic aneurysm (AAA) screening. You may need this if you are a current or former smoker.  Osteoporosis. You may be screened starting at age 43 if you are at high risk.  Talk with your health care provider about your test results, treatment options, and if necessary, the need for more tests. Vaccines Your health care provider may recommend certain vaccines, such as:  Influenza vaccine. This is recommended every year.  Tetanus, diphtheria, and acellular pertussis (Tdap, Td) vaccine. You may need a Td booster every 10 years.  Varicella vaccine. You may need this if you have not been vaccinated.  Zoster vaccine. You may need this after age 52.  Measles, mumps, and rubella (MMR) vaccine. You may need at least one dose of MMR if you were born in 1957 or later. You may also need a second dose.  Pneumococcal 13-valent conjugate (PCV13) vaccine. One dose is recommended after age 19.  Pneumococcal polysaccharide (PPSV23) vaccine. One dose is recommended after age 98.  Meningococcal  vaccine. You may need this if you have certain conditions.  Hepatitis A vaccine. You may need this if you have certain conditions or if you travel or work in places where you may be exposed to hepatitis A.  Hepatitis B vaccine. You may need this if you have certain conditions or if you travel or work in places where you may be exposed to  hepatitis B.  Haemophilus influenzae type b (Hib) vaccine. You may need this if you have certain risk factors.  Talk to your health care provider about which screenings and vaccines you need and how often you need them. This information is not intended to replace advice given to you by your health care provider. Make sure you discuss any questions you have with your health care provider. Document Released: 11/27/2015 Document Revised: 07/20/2016 Document Reviewed: 09/01/2015 Elsevier Interactive Patient Education  2018 [employer].    IF you received an x-ray today, you will receive an invoice from [facility]. Please contact [facility] at [phone] with questions or concerns regarding your invoice.   IF you received labwork today, you will receive an invoice from [place]. Please contact [facility] at [phone] with questions or concerns regarding your invoice.   Our billing staff will not be able to assist you with questions regarding bills from these companies.  You will be contacted with the lab results as soon as they are available. The fastest way to get your results is to activate your My Chart account. Instructions are located on the last page of this paperwork. If you have not heard from [place] regarding the results in 2 weeks, please contact this office.

## 2018-04-24 LAB — COMPREHENSIVE METABOLIC PANEL
ALBUMIN: 4.4 g/dL (ref 3.5–4.8)
ALK PHOS: 40 IU/L (ref 39–117)
ALT: 15 IU/L (ref 0–44)
AST: 23 IU/L (ref 0–40)
Albumin/Globulin Ratio: 1.7 (ref 1.2–2.2)
BUN / CREAT RATIO: 15 (ref 10–24)
BUN: 14 mg/dL (ref 8–27)
Bilirubin Total: 0.6 mg/dL (ref 0.0–1.2)
CO2: 26 mmol/L (ref 20–29)
CREATININE: 0.93 mg/dL (ref 0.76–1.27)
Calcium: 9.5 mg/dL (ref 8.6–10.2)
Chloride: 101 mmol/L (ref 96–106)
GFR calc non Af Amer: 82 mL/min/{1.73_m2} (ref >59)
GFR, EST AFRICAN AMERICAN: 94 mL/min/{1.73_m2} (ref >59)
GLOBULIN, TOTAL: 2.6 g/dL (ref 1.5–4.5)
Glucose: 110 mg/dL — ABNORMAL HIGH (ref 65–99)
Potassium: 3.9 mmol/L (ref 3.5–5.2)
SODIUM: 140 mmol/L (ref 134–144)
Total Protein: 7 g/dL (ref 6.0–8.5)

## 2018-04-24 LAB — LIPID PANEL
CHOL/HDL RATIO: 4.1 ratio (ref 0.0–5.0)
CHOLESTEROL TOTAL: 173 mg/dL (ref 100–199)
HDL: 42 mg/dL (ref >39)
LDL CALC: 111 mg/dL — AB (ref 0–99)
Triglycerides: 99 mg/dL (ref 0–149)
VLDL Cholesterol Cal: 20 mg/dL (ref 5–40)

## 2018-04-24 LAB — TSH: TSH: 4.01 u[IU]/mL (ref 0.450–4.500)

## 2018-04-24 LAB — PSA: PROSTATE SPECIFIC AG, SERUM: 0.6 ng/mL (ref 0.0–4.0)

## 2018-08-10 DIAGNOSIS — H5203 Hypermetropia, bilateral: Secondary | ICD-10-CM | POA: Diagnosis not present

## 2018-10-23 ENCOUNTER — Other Ambulatory Visit: Payer: Self-pay

## 2018-10-23 ENCOUNTER — Ambulatory Visit (INDEPENDENT_AMBULATORY_CARE_PROVIDER_SITE_OTHER): Payer: Medicare HMO | Admitting: Family Medicine

## 2018-10-23 ENCOUNTER — Encounter: Payer: Self-pay | Admitting: Family Medicine

## 2018-10-23 VITALS — BP 130/70 | HR 58 | Temp 98.5°F | Ht 69.0 in | Wt 214.6 lb

## 2018-10-23 DIAGNOSIS — E039 Hypothyroidism, unspecified: Secondary | ICD-10-CM

## 2018-10-23 DIAGNOSIS — E785 Hyperlipidemia, unspecified: Secondary | ICD-10-CM | POA: Diagnosis not present

## 2018-10-23 DIAGNOSIS — I10 Essential (primary) hypertension: Secondary | ICD-10-CM | POA: Diagnosis not present

## 2018-10-23 NOTE — Patient Instructions (Addendum)
  We will recheck cholesterol today to decide if statin needed, but can also discuss those numbers at your upcoming cardiology visit.   No med changes today. Thanks for coming in today.    If you have lab work done today you will be contacted with your lab results within the next 2 weeks.  If you have not heard from us then please contact us. The fastest way to get your results is to register for My Chart.   IF you received an x-ray today, you will receive an invoice [facility]. Please contact [facility] at [phone] with questions or concerns regarding your invoice.   IF you received labwork today, you will receive an invoice from [facility]. Please contact [facility] at [phone] with questions or concerns regarding your invoice.   Our billing staff will not be able to assist you with questions regarding bills from these companies.  You will be contacted with the lab results as soon as they are available. The fastest way to get your results is to activate your My Chart account. Instructions are located on the last page of this paperwork. If you have not heard from us regarding the results in 2 weeks, please contact this office.

## 2018-10-23 NOTE — Progress Notes (Signed)
Subjective:    Patient ID: Terry Vega, male    DOB: 1946/11/01, 72 y.o.   MRN: [id number]  HPI Terry Vega is a 72 y.o. male Presents today for: Chief Complaint  Patient presents with  . Hypertension    6 m f/u   . Thyroid check   Hypertension: BP Readings from Last 3 Encounters:  10/23/18 130/70  04/23/18 126/78  02/15/18 136/80   Lab Results  Component Value Date   CREATININE 0.93 04/23/2018  Currently on losartan HCT 100/25 mg daily, carvedilol 6.25 mg twice daily, aspirin 81 mg daily.  CArdiologist - Dr. Kirke CorinArida appt in February.   Hypothyroidism: Lab Results  Component Value Date   TSH 4.010 04/23/2018  Currently taking Synthroid 100 mcg daily. No new skin/hair changes, weight change or temp intolerance.  No new CP/heart palpitations (resolved with BP med change).   Hyperlipidemia:  Lab Results  Component Value Date   CHOL 173 04/23/2018   HDL 42 04/23/2018   LDLCALC 111 (H) 04/23/2018   TRIG 99 04/23/2018   CHOLHDL 4.1 04/23/2018   Lab Results  Component Value Date   ALT 15 04/23/2018   AST 23 04/23/2018   ALKPHOS 40 04/23/2018   BILITOT 0.6 04/23/2018  not currently on statin, no known prior recommendations for statin.  The 10-year ASCVD risk score Denman George(Goff DC Montez HagemanJr., et al., 2013) is: 24.4%   Values used to calculate the score:     Age: [age] years     Sex: Male     Is Non-Hispanic African American: No     Diabetic: No     Tobacco smoker: No     Systolic Blood Pressure: 130 mmHg     Is BP treated: Yes     HDL Cholesterol: 42 mg/dL     Total Cholesterol: 173 mg/dL Fasting today.   Review of Systems  Constitutional: Negative for fatigue and unexpected weight change.  Eyes: Negative for visual disturbance.  Respiratory: Negative for cough, chest tightness and shortness of breath.   Cardiovascular: Negative for chest pain, palpitations and leg swelling.  Gastrointestinal: Negative for abdominal pain and blood in stool.  Endocrine: Negative for  cold intolerance and heat intolerance.  Skin: Negative.   Neurological: Negative for dizziness, light-headedness and headaches.       Objective:   Physical Exam Vitals signs reviewed.  Constitutional:      Appearance: He is well-developed.  HENT:     Head: Normocephalic and atraumatic.  Eyes:     Pupils: Pupils are equal, round, and reactive to light.  Neck:     Vascular: No carotid bruit or JVD.  Cardiovascular:     Rate and Rhythm: Normal rate and regular rhythm.     Heart sounds: Normal heart sounds. No murmur.  Pulmonary:     Effort: Pulmonary effort is normal.     Breath sounds: Normal breath sounds. No rales.  Skin:    General: Skin is warm and dry.  Neurological:     Mental Status: He is alert and oriented to person, place, and time.    Vitals:   10/23/18 1003 10/23/18 1004  BP: (!) 150/85 130/70  Pulse: (!) 58   Temp: 98.5 F (36.9 C)   TempSrc: Oral   SpO2: 93%   Weight: 214 lb 9.6 oz (97.3 kg)   Height: 5\' 9"  (1.753 m)        Assessment & Plan:  Terry Vega is a 72 y.o. male Essential  hypertension - Plan: Comprehensive metabolic panel  -  Stable, tolerating current regimen. Medications refilled. Labs pending as above.   Hypothyroidism, unspecified type - Plan: TSH  -  Stable, tolerating current regimen. Medications refilled. Labs pending as above.   Hyperlipidemia, unspecified hyperlipidemia type - Plan: Lipid panel  -Check labs, discussed ASCVD risk and general statin recommendations.  Can also discuss statin with upcoming cardiology visit.  No orders of the defined types were placed in this encounter.  Patient Instructions    We will recheck cholesterol today to decide if statin needed, but can also discuss those numbers at your upcoming cardiology visit.   No med changes today. Thanks for coming in today.    If you have lab work done today you will be contacted with your lab results within the next 2 weeks.  If you have not heard from  [place] then please contact us. The fastest way to get your results is to register for My Chart.   IF you received an x-ray today, you will receive an invoice from [facility]. Please contact [facility] at [phone] with questions or concerns regarding your invoice.   IF you received labwork today, you will receive an invoice from [facility]. Please contact [facility] at [phone] with questions or concerns regarding your invoice.   Our billing staff will not be able to assist you with questions regarding bills from these companies.  You will be contacted with the lab results as soon as they are available. The fastest way to get your results is to activate your My Chart account. Instructions are located on the last page of this paperwork. If you have not heard from [place] regarding the results in 2 weeks, please contact this office.       Signed,   Meredith Staggers, MD [facility].  10/25/18 10:38 PM

## 2018-10-24 LAB — LIPID PANEL
CHOL/HDL RATIO: 4 ratio (ref 0.0–5.0)
Cholesterol, Total: 164 mg/dL (ref 100–199)
HDL: 41 mg/dL (ref >39)
LDL Calculated: 110 mg/dL — ABNORMAL HIGH (ref 0–99)
Triglycerides: 63 mg/dL (ref 0–149)
VLDL CHOLESTEROL CAL: 13 mg/dL (ref 5–40)

## 2018-10-24 LAB — COMPREHENSIVE METABOLIC PANEL
ALK PHOS: 36 IU/L — AB (ref 39–117)
ALT: 18 IU/L (ref 0–44)
AST: 31 IU/L (ref 0–40)
Albumin/Globulin Ratio: 1.6 (ref 1.2–2.2)
Albumin: 4.2 g/dL (ref 3.5–4.8)
BUN/Creatinine Ratio: 17 (ref 10–24)
BUN: 16 mg/dL (ref 8–27)
Bilirubin Total: 0.6 mg/dL (ref 0.0–1.2)
CALCIUM: 9.5 mg/dL (ref 8.6–10.2)
CO2: 25 mmol/L (ref 20–29)
CREATININE: 0.94 mg/dL (ref 0.76–1.27)
Chloride: 99 mmol/L (ref 96–106)
GFR calc Af Amer: 93 mL/min/{1.73_m2} (ref >59)
GFR, EST NON AFRICAN AMERICAN: 81 mL/min/{1.73_m2} (ref >59)
GLUCOSE: 107 mg/dL — AB (ref 65–99)
Globulin, Total: 2.6 g/dL (ref 1.5–4.5)
Potassium: 3.8 mmol/L (ref 3.5–5.2)
Sodium: 139 mmol/L (ref 134–144)
Total Protein: 6.8 g/dL (ref 6.0–8.5)

## 2018-10-24 LAB — TSH: TSH: 1.93 u[IU]/mL (ref 0.450–4.500)

## 2019-02-01 ENCOUNTER — Telehealth: Payer: Self-pay | Admitting: *Deleted

## 2019-02-01 NOTE — Telephone Encounter (Signed)
.     Cardiac Questionnaire:    Since your last visit or hospitalization:    1. Have you been having new or worsening chest pain? No   2. Have you been having new or worsening shortness of breath? No 3. Have you been having new or worsening leg swelling, wt gain, or increase in abdominal girth (pants fitting more tightly)? No   4. Have you had any passing out spells? No     Primary Cardiologist:  Muhammad Arida, MD   Patient contacted.  History reviewed.  No symptoms to suggest any unstable cardiac conditions.  Based on discussion, with current pandemic situation, we will be postponing this appointment for the patient.  If symptoms change, he has been instructed to contact our office.   Routing to C19 CANCEL pool for tracking (P CV DIV CV19 CANCEL) and assigning priority (1 = 4-6 wks, 2 = 6-12 wks, 3 = >12 wks).        .             

## 2019-02-05 ENCOUNTER — Ambulatory Visit: Payer: Medicare HMO | Admitting: Cardiovascular Disease

## 2019-02-12 ENCOUNTER — Other Ambulatory Visit: Payer: Self-pay | Admitting: Cardiovascular Disease

## 2019-02-14 NOTE — Telephone Encounter (Signed)
Spoke with patient. He declined a virtual visit.   He is aware it will be several months before he can be seen in person and will call our office if any concerns or questions before that time.

## 2019-04-10 ENCOUNTER — Other Ambulatory Visit: Payer: Self-pay | Admitting: Emergency Medicine

## 2019-04-10 DIAGNOSIS — E039 Hypothyroidism, unspecified: Secondary | ICD-10-CM

## 2019-04-10 DIAGNOSIS — E785 Hyperlipidemia, unspecified: Secondary | ICD-10-CM

## 2019-04-10 DIAGNOSIS — I10 Essential (primary) hypertension: Secondary | ICD-10-CM

## 2019-04-26 ENCOUNTER — Ambulatory Visit: Payer: Medicare HMO | Admitting: Family Medicine

## 2019-04-30 ENCOUNTER — Ambulatory Visit (INDEPENDENT_AMBULATORY_CARE_PROVIDER_SITE_OTHER): Payer: Medicare HMO | Admitting: Family Medicine

## 2019-04-30 ENCOUNTER — Other Ambulatory Visit: Payer: Self-pay

## 2019-04-30 VITALS — BP 126/72 | Ht 69.0 in | Wt 209.0 lb

## 2019-04-30 DIAGNOSIS — Z0000 Encounter for general adult medical examination without abnormal findings: Secondary | ICD-10-CM

## 2019-04-30 NOTE — Progress Notes (Signed)
Presents today for [employer] Annual [facility] Visit   Date of last exam:04/10/2019    Interpreter used for this visit? No  I connected with  Terry Vega on 04/30/19 by  telephone and verified that I am speaking with the correct person using two identifiers.   I discussed the limitations of evaluation and management by telemedicine. The patient expressed understanding and agreed to proceed.   Patient Care Team: Terry Vega, Terry R, MD as PCP - General (Family Medicine)   Other items to address today:  Discussed Eye/Dental exams Discussed immunizations TDAP declined due to insurance Will have labs done next week for upcoming visit with Dr. Neva Vega.  Labs are ordered      Other Screening:  Last lipid screening: 10/23/2018  ADVANCE DIRECTIVES: Discussed:  YES On File: YES Materials Provided: no  Immunization status:  Immunization History  Administered Date(s) Administered  . Influenza Split 08/13/2014  . Influenza, High Dose Seasonal PF 08/29/2017  . Influenza,inj,Quad PF,6+ Mos 10/09/2013  . Influenza-Unspecified 08/24/2016  . Pneumococcal Conjugate-13 09/11/2014  . Pneumococcal Polysaccharide-23 04/20/2017  . Td 12/23/2008  . Zoster 04/03/2016, 07/13/2016     Health Maintenance Due  Topic Date Due  . TETANUS/TDAP  12/23/2018     Functional Status Survey: Is the patient deaf or have difficulty hearing?: Yes(hearing aid mild loss right ear.) Does the patient have difficulty seeing, even when wearing glasses/contacts?: No Does the patient have difficulty concentrating, remembering, or making decisions?: No Does the patient have difficulty walking or climbing stairs?: No Does the patient have difficulty dressing or bathing?: No Does the patient have difficulty doing errands alone such as visiting a doctor's office or shopping?: No   6CIT Screen 04/30/2019 04/23/2018  What Year? 0 points 0 points  What month? 0 points 0 points  What time? 0 points  0 points  Count back from 20 0 points 0 points  Months in reverse 0 points 0 points  Repeat phrase 0 points 0 points  Total Score 0 0        Clinical Support from 04/30/2019 in [facility] [place]  AUDIT-C Score  3       Home Environment:   Lives in two story lives on main floor mainly  With wife  Grab bars No trouble climbing stairs Adequate lighting No scattered rugs     Patient Active Problem List   Diagnosis Date Noted  . Leg edema 05/06/2014  . PVC (premature ventricular contraction) 10/15/2013  . Hypertension 10/08/2012  . Hypothyroid 10/08/2012     Past Medical History:  Diagnosis Date  . Allergy   . Heart murmur   . Hypertension   . Thyroid disease      Past Surgical History:  Procedure Laterality Date  . COLONOSCOPY    . HEMORRHOID SURGERY    . VASECTOMY       Family History  Problem Relation Age of Onset  . Heart disease Mother   . Cancer Father   . Diabetes Father   . Stroke Maternal Grandmother   . Diabetes Maternal Grandmother   . Colon cancer Neg Hx   . Esophageal cancer Neg Hx   . Rectal cancer Neg Hx   . Stomach cancer Neg Hx      Social History   Socioeconomic History  . Marital status: Married    Spouse name: Not on file  . Number of children: Not on file  . Years of education: Not on file  .  Highest education level: Not on file  Occupational History  . Not on file  Social Needs  . Financial resource strain: Not on file  . Food insecurity    Worry: Not on file    Inability: Not on file  . Transportation needs    Medical: Not on file    Non-medical: Not on file  Tobacco Use  . Smoking status: Never Smoker  . Smokeless tobacco: Never Used  Substance and Sexual Activity  . Alcohol use: Yes    Comment: occasionally  . Drug use: No  . Sexual activity: Yes    Birth control/protection: None  Lifestyle  . Physical activity    Days per week: Not on file    Minutes per session: Not on file  . Stress: Not on file   Relationships  . Social Musicianconnections    Talks on phone: Not on file    Gets together: Not on file    Attends religious service: Not on file    Active member of club or organization: Not on file    Attends meetings of clubs or organizations: Not on file    Relationship status: Not on file  . Intimate partner violence    Fear of current or ex partner: Not on file    Emotionally abused: Not on file    Physically abused: Not on file    Forced sexual activity: Not on file  Other Topics Concern  . Not on file  Social History Narrative  . Not on file     No Known Allergies   Prior to Admission medications   Medication Sig Start Date End Date Taking? Authorizing Provider  aspirin 81 MG tablet Take 81 mg by mouth daily.   Yes [provider]  carvedilol (COREG) 6.25 MG tablet TAKE 1 TABLET TWICE DAILY 02/13/19  Yes [place] Vega, Terry A, MD  cholecalciferol (VITAMIN D) 1000 UNITS tablet Take 1,000 Units by mouth daily.   Yes [provider]  Cyanocobalamin (VITAMIN B12 PO) Take 1,000 mg by mouth 2 (two) times daily.    Yes [provider]  levothyroxine (SYNTHROID, LEVOTHROID) 100 MCG tablet Take 1 tablet (100 mcg total) by mouth daily. 04/23/18  Yes Terry Vega, Terry R, MD  loratadine (CLARITIN) 10 MG tablet Take 10 mg by mouth daily as needed.    Yes [provider]  losartan-hydrochlorothiazide (HYZAAR) 100-25 MG tablet TAKE 1 TABLET EVERY DAY 02/13/19  Yes [place] Vega, Terry A, MD  Multiple Vitamin (MULTIVITAMIN) tablet Take 1 tablet by mouth daily.   Yes [provider]  Naproxen Sodium (ALEVE PO) Take by mouth as needed.   Yes [provider]  vitamin E 1000 UNIT capsule Take 1,000 Units by mouth daily.   Yes [provider]     Depression screen [facility] 2/9 04/30/2019 10/23/2018 04/23/2018 02/15/2018 10/12/2017  Decreased Interest 0 0 0 0 0  Down, Depressed, Hopeless 0 0 0 0 0  PHQ - 2 Score 0 0 0 0 0     Fall Risk  04/30/2019  10/23/2018 04/23/2018 02/15/2018 10/12/2017  Falls in the past year? 0 0 No No No  Injury with Fall? 0 - - - -  Follow up Falls evaluation completed;Education provided;Falls prevention discussed - - - -      PHYSICAL EXAM: BP 126/72 Comment: patient took at home  Ht 5\' 9"  (1.753 m)   Wt 209 lb (94.8 kg) Comment: per patient  BMI 30.86 kg/m    Wt Readings from  Last 3 Encounters:  04/30/19 209 lb (94.8 kg)  10/23/18 214 lb 9.6 oz (97.3 kg)  04/23/18 216 lb (98 kg)     No exam data present    Physical Exam   Education/Counseling provided regarding diet and exercise, prevention of chronic diseases, smoking/tobacco cessation, if applicable, and reviewed "Covered Medicare Preventive Services."

## 2019-04-30 NOTE — Patient Instructions (Addendum)
Thank you for taking time to come for your Medicare Wellness Visit. I appreciate your ongoing commitment to your health goals. Please review the following plan we discussed and let me know if I can assist you in the future.  Julie Greer LPN  Healthy Eating Following a healthy eating pattern may help you to achieve and maintain a healthy body weight, reduce the risk of chronic disease, and live a long and productive life. It is important to follow a healthy eating pattern at an appropriate calorie level for your body. Your nutritional needs should be met primarily through food by choosing a variety of nutrient-rich foods. What are tips for following this plan? Reading food labels  Read labels and choose the following: ? Reduced or low sodium. ? Juices with 100% fruit juice. ? Foods with low saturated fats and high polyunsaturated and monounsaturated fats. ? Foods with whole grains, such as whole wheat, cracked wheat, brown rice, and wild rice. ? Whole grains that are fortified with folic acid. This is recommended for women who are pregnant or who want to become pregnant.  Read labels and avoid the following: ? Foods with a lot of added sugars. These include foods that contain brown sugar, corn sweetener, corn syrup, dextrose, fructose, glucose, high-fructose corn syrup, honey, invert sugar, lactose, malt syrup, maltose, molasses, raw sugar, sucrose, trehalose, or turbinado sugar.  Do not eat more than the following amounts of added sugar per day:  6 teaspoons (25 g) for women.  9 teaspoons (38 g) for men. ? Foods that contain processed or refined starches and grains. ? Refined grain products, such as white flour, degermed cornmeal, white bread, and white rice. Shopping  Choose nutrient-rich snacks, such as vegetables, whole fruits, and nuts. Avoid high-calorie and high-sugar snacks, such as potato chips, fruit snacks, and candy.  Use oil-based dressings and spreads on foods instead of  solid fats such as butter, stick margarine, or cream cheese.  Limit pre-made sauces, mixes, and "instant" products such as flavored rice, instant noodles, and ready-made pasta.  Try more plant-protein sources, such as tofu, tempeh, black beans, edamame, lentils, nuts, and seeds.  Explore eating plans such as the Mediterranean diet or vegetarian diet. Cooking  Use oil to saut or stir-fry foods instead of solid fats such as butter, stick margarine, or lard.  Try baking, boiling, grilling, or broiling instead of frying.  Remove the fatty part of meats before cooking.  Steam vegetables in water or broth. Meal planning   At meals, imagine dividing your plate into fourths: ? One-half of your plate is fruits and vegetables. ? One-fourth of your plate is whole grains. ? One-fourth of your plate is protein, especially lean meats, poultry, eggs, tofu, beans, or nuts.  Include low-fat dairy as part of your daily diet. Lifestyle  Choose healthy options in all settings, including home, work, school, restaurants, or stores.  Prepare your food safely: ? Wash your hands after handling raw meats. ? Keep food preparation surfaces clean by regularly washing with hot, soapy water. ? Keep raw meats separate from ready-to-eat foods, such as fruits and vegetables. ? Cook seafood, meat, poultry, and eggs to the recommended internal temperature. ? Store foods at safe temperatures. In general:  Keep cold foods at 40F (4.4C) or below.  Keep hot foods at 140F (60C) or above.  Keep your freezer at 0F (-17.8C) or below.  Foods are no longer safe to eat when they have been between the temperatures of 40-140F (4.4-60C) for   more than 2 hours. What foods should I eat? Fruits Aim to eat 2 cup-equivalents of fresh, canned (in natural juice), or frozen fruits each day. Examples of 1 cup-equivalent of fruit include 1 small apple, 8 large strawberries, 1 cup canned fruit,  cup dried fruit, or 1 cup  100% juice. Vegetables Aim to eat 2-3 cup-equivalents of fresh and frozen vegetables each day, including different varieties and colors. Examples of 1 cup-equivalent of vegetables include 2 medium carrots, 2 cups raw, leafy greens, 1 cup chopped vegetable (raw or cooked), or 1 medium baked potato. Grains Aim to eat 6 ounce-equivalents of whole grains each day. Examples of 1 ounce-equivalent of grains include 1 slice of bread, 1 cup ready-to-eat cereal, 3 cups popcorn, or  cup cooked rice, pasta, or cereal. Meats and other proteins Aim to eat 5-6 ounce-equivalents of protein each day. Examples of 1 ounce-equivalent of protein include 1 egg, 1/2 cup nuts or seeds, or 1 tablespoon (16 g) peanut butter. A cut of meat or fish that is the size of a deck of cards is about 3-4 ounce-equivalents.  Of the protein you eat each week, try to have at least 8 ounces come from seafood. This includes salmon, trout, herring, and anchovies. Dairy Aim to eat 3 cup-equivalents of fat-free or low-fat dairy each day. Examples of 1 cup-equivalent of dairy include 1 cup (240 mL) milk, 8 ounces (250 g) yogurt, 1 ounces (44 g) natural cheese, or 1 cup (240 mL) fortified soy milk. Fats and oils  Aim for about 5 teaspoons (21 g) per day. Choose monounsaturated fats, such as canola and olive oils, avocados, peanut butter, and most nuts, or polyunsaturated fats, such as sunflower, corn, and soybean oils, walnuts, pine nuts, sesame seeds, sunflower seeds, and flaxseed. Beverages  Aim for six 8-oz glasses of water per day. Limit coffee to three to five 8-oz cups per day.  Limit caffeinated beverages that have added calories, such as soda and energy drinks.  Limit alcohol intake to no more than 1 drink a day for nonpregnant women and 2 drinks a day for men. One drink equals 12 oz of beer (355 mL), 5 oz of wine (148 mL), or 1 oz of hard liquor (44 mL). Seasoning and other foods  Avoid adding excess amounts of salt to your  foods. Try flavoring foods with herbs and spices instead of salt.  Avoid adding sugar to foods.  Try using oil-based dressings, sauces, and spreads instead of solid fats. This information is based on general [place] nutrition guidelines. For more information, visit choosemyplate.gov. Exact amounts may vary based on your nutrition needs. Summary  A healthy eating plan may help you to maintain a healthy weight, reduce the risk of chronic diseases, and stay active throughout your life.  Plan your meals. Make sure you eat the right portions of a variety of nutrient-rich foods.  Try baking, boiling, grilling, or broiling instead of frying.  Choose healthy options in all settings, including home, work, school, restaurants, or stores. This information is not intended to replace advice given to you by your health care provider. Make sure you discuss any questions you have with your health care provider. Document Released: 02/12/2018 Document Revised: 02/12/2018 Document Reviewed: 02/12/2018 Elsevier Interactive Patient Education  2019 [employer]  

## 2019-05-03 ENCOUNTER — Other Ambulatory Visit: Payer: Self-pay

## 2019-05-03 ENCOUNTER — Ambulatory Visit (INDEPENDENT_AMBULATORY_CARE_PROVIDER_SITE_OTHER): Payer: Medicare HMO | Admitting: Family Medicine

## 2019-05-03 DIAGNOSIS — E039 Hypothyroidism, unspecified: Secondary | ICD-10-CM | POA: Diagnosis not present

## 2019-05-03 DIAGNOSIS — I10 Essential (primary) hypertension: Secondary | ICD-10-CM | POA: Diagnosis not present

## 2019-05-03 DIAGNOSIS — E785 Hyperlipidemia, unspecified: Secondary | ICD-10-CM

## 2019-05-04 LAB — COMPREHENSIVE METABOLIC PANEL
ALT: 17 IU/L (ref 0–44)
AST: 21 IU/L (ref 0–40)
Albumin/Globulin Ratio: 1.6 (ref 1.2–2.2)
Albumin: 4.1 g/dL (ref 3.7–4.7)
Alkaline Phosphatase: 42 IU/L (ref 39–117)
BUN/Creatinine Ratio: 15 (ref 10–24)
BUN: 15 mg/dL (ref 8–27)
Bilirubin Total: 0.4 mg/dL (ref 0.0–1.2)
CO2: 28 mmol/L (ref 20–29)
Calcium: 9.1 mg/dL (ref 8.6–10.2)
Chloride: 97 mmol/L (ref 96–106)
Creatinine, Ser: 0.99 mg/dL (ref 0.76–1.27)
GFR calc Af Amer: 87 mL/min/{1.73_m2} (ref >59)
GFR calc non Af Amer: 75 mL/min/{1.73_m2} (ref >59)
Globulin, Total: 2.5 g/dL (ref 1.5–4.5)
Glucose: 102 mg/dL — ABNORMAL HIGH (ref 65–99)
Potassium: 4 mmol/L (ref 3.5–5.2)
Sodium: 141 mmol/L (ref 134–144)
Total Protein: 6.6 g/dL (ref 6.0–8.5)

## 2019-05-04 LAB — LIPID PANEL
Chol/HDL Ratio: 4.1 ratio (ref 0.0–5.0)
Cholesterol, Total: 169 mg/dL (ref 100–199)
HDL: 41 mg/dL (ref >39)
LDL Calculated: 109 mg/dL — ABNORMAL HIGH (ref 0–99)
Triglycerides: 95 mg/dL (ref 0–149)
VLDL Cholesterol Cal: 19 mg/dL (ref 5–40)

## 2019-05-04 LAB — TSH: TSH: 3.51 u[IU]/mL (ref 0.450–4.500)

## 2019-05-06 ENCOUNTER — Encounter: Payer: Self-pay | Admitting: Family Medicine

## 2019-05-10 ENCOUNTER — Encounter: Payer: Self-pay | Admitting: Family Medicine

## 2019-05-10 ENCOUNTER — Other Ambulatory Visit: Payer: Self-pay

## 2019-05-10 ENCOUNTER — Ambulatory Visit (INDEPENDENT_AMBULATORY_CARE_PROVIDER_SITE_OTHER): Payer: Medicare HMO | Admitting: Family Medicine

## 2019-05-10 VITALS — BP 130/72 | HR 65 | Temp 98.2°F | Ht 69.0 in | Wt 210.8 lb

## 2019-05-10 DIAGNOSIS — Z23 Encounter for immunization: Secondary | ICD-10-CM

## 2019-05-10 DIAGNOSIS — E039 Hypothyroidism, unspecified: Secondary | ICD-10-CM | POA: Diagnosis not present

## 2019-05-10 DIAGNOSIS — I10 Essential (primary) hypertension: Secondary | ICD-10-CM | POA: Diagnosis not present

## 2019-05-10 NOTE — Progress Notes (Signed)
Subjective:    Patient ID: Terry Vega, male    DOB: December 19, 1945, 73 y.o.   MRN: [id number]  HPI Terry Vega is a 73 y.o. male Presents today for: Chief Complaint  Patient presents with   Annual Exam    CPE (last wellness 04/30/19)   Medication Refill    losartin HCTZ and Synthroid   Care team: PCP: me Cardiology: Arida  - appt in August.   Hypertension: BP Readings from Last 3 Encounters:  05/10/19 130/72  04/30/19 126/72  10/23/18 130/70   Lab Results  Component Value Date   CREATININE 0.99 05/03/2019  coreg 6.82m BID, losartan hct 100/266mqd. No new side effects.  Home BP: 116-130/60's. No lightheadedness.  History of PVCs followed by cardiology.  Asa 8158md. No hx of PUD, no blood in stool/dark stools.    Hypothyroidism: Lab Results  Component Value Date   TSH 3.510 05/03/2019  synthroid 100m56mer day.  No new heat or cold changes, no hair/skin chnges or palpitations.   Occasional claritin for allergies.   Cancer screening: Colonoscopy June 2016 - 10 years.  Prostate: PSA 0.5. father with prostate cancer. Deferred DRE.  No new skin lesions. Wears sunblock avoiding sun as able.   Immunization History  Administered Date(s) Administered   Influenza Split 08/13/2014   Influenza, High Dose Seasonal PF 08/29/2017   Influenza,inj,Quad PF,6+ Mos 10/09/2013   Influenza-Unspecified 08/24/2016   Pneumococcal Conjugate-13 09/11/2014   Pneumococcal Polysaccharide-23 04/20/2017   Td 12/23/2008   Zoster 04/03/2016, 07/13/2016   Fall screen: no falls in past year.   Depression screen PHQ [facility] 05/10/2019 04/30/2019 10/23/2018 04/23/2018 02/15/2018  Decreased Interest 0 0 0 0 0  Down, Depressed, Hopeless 0 0 0 0 0  PHQ - 2 Score 0 0 0 0 0   Functional Status Survey:    6CIT Screen 04/30/2019 04/23/2018  What Year? 0 points 0 points  What month? 0 points 0 points  What time? 0 points 0 points  Count back from 20 0 points 0 points  Months in  reverse 0 points 0 points  Repeat phrase 0 points 0 points  Total Score 0 0    No exam data present   Dental: every 6mon49monthtural teeth.   Exercise: 3-4 days per week, [id number] [place] [employer] directives: has HCPOA and living will.    Patient Active Problem List   Diagnosis Date Noted   Leg edema 05/06/2014   PVC (premature ventricular contraction) 10/15/2013   Hypertension 10/08/2012   Hypothyroid 10/08/2012   Past Medical History:  Diagnosis Date   Allergy    Heart murmur    Hypertension    Thyroid disease    Past Surgical History:  Procedure Laterality Date   COLONOSCOPY     HEMORRHOID SURGERY     VASECTOMY     No Known Allergies Prior to Admission medications   Medication Sig Start Date End Date Taking? Authorizing Provider  aspirin 81 MG tablet Take 81 mg by mouth daily.   Yes [provider]  carvedilol (COREG) 6.25 MG tablet TAKE 1 TABLET TWICE DAILY 02/13/19  Yes Arida,Wellington Hampshirecholecalciferol (VITAMIN D) 1000 UNITS tablet Take 1,000 Units by mouth daily.   Yes [provider]  Cyanocobalamin (VITAMIN B12 PO) Take 1,000 mg by mouth 2 (two) times daily.    Yes [provider]  levothyroxine (SYNTHROID, LEVOTHROID) 100 MCG tablet Take 1 tablet (100 mcg  total) by mouth daily. 04/23/18  Yes Wendie Agreste, MD  loratadine (CLARITIN) 10 MG tablet Take 10 mg by mouth daily as needed.    Yes [provider]  losartan-hydrochlorothiazide (HYZAAR) 100-25 MG tablet TAKE 1 TABLET EVERY DAY 02/13/19  Yes Wellington Hampshire, MD  Multiple Vitamin (MULTIVITAMIN) tablet Take 1 tablet by mouth daily.   Yes [provider]  Naproxen Sodium (ALEVE PO) Take by mouth as needed.   Yes [provider]  vitamin E 1000 UNIT capsule Take 1,000 Units by mouth daily.   Yes [provider]  PREVIDENT 5000 SENSITIVE 1.1-5 % PSTE  04/10/19   [provider]   Social History    Socioeconomic History   Marital status: Married    Spouse name: Not on file   Number of children: Not on file   Years of education: Not on file   Highest education level: Not on file  Occupational History   Not on file  Social Needs   Financial resource strain: Not on file   Food insecurity    Worry: Not on file    Inability: Not on file   Transportation needs    Medical: Not on file    Non-medical: Not on file  Tobacco Use   Smoking status: Never Smoker   Smokeless tobacco: Never Used  Substance and Sexual Activity   Alcohol use: Yes    Comment: occasionally   Drug use: No   Sexual activity: Yes    Birth control/protection: None  Lifestyle   Physical activity    Days per week: Not on file    Minutes per session: Not on file   Stress: Not on file  Relationships   Social connections    Talks on phone: Not on file    Gets together: Not on file    Attends religious service: Not on file    Active member of club or organization: Not on file    Attends meetings of clubs or organizations: Not on file    Relationship status: Not on file   Intimate partner violence    Fear of current or ex partner: Not on file    Emotionally abused: Not on file    Physically abused: Not on file    Forced sexual activity: Not on file  Other Topics Concern   Not on file  Social History Narrative   Not on file    Review of Systems 13 point review of systems per patient health survey noted.  Negative other than as indicated above or in HPI.      Objective:   Physical Exam Vitals signs reviewed.  Constitutional:      Appearance: He is well-developed.  HENT:     Head: Normocephalic and atraumatic.     Right Ear: External ear normal.     Left Ear: External ear normal.  Eyes:     Conjunctiva/sclera: Conjunctivae normal.     Pupils: Pupils are equal, round, and reactive to light.  Neck:     Musculoskeletal: Normal range of motion and neck supple.     Thyroid: No  thyromegaly.  Cardiovascular:     Rate and Rhythm: Normal rate and regular rhythm.     Heart sounds: Normal heart sounds.  Pulmonary:     Effort: Pulmonary effort is normal. No respiratory distress.     Breath sounds: Normal breath sounds. No wheezing.  Abdominal:     General: There is no distension.  Palpations: Abdomen is soft.     Tenderness: There is no abdominal tenderness.  Musculoskeletal: Normal range of motion.        General: No tenderness.  Lymphadenopathy:     Cervical: No cervical adenopathy.  Skin:    General: Skin is warm and dry.  Neurological:     Mental Status: He is alert and oriented to person, place, and time.     Deep Tendon Reflexes: Reflexes are normal and symmetric.  Psychiatric:        Behavior: Behavior normal.    Vitals:   05/10/19 1005 05/10/19 1026  BP: (!) 164/77 130/72  Pulse: 65   Temp: 98.2 F (36.8 C)   TempSrc: Oral   SpO2: 98%   Weight: 210 lb 12.8 oz (95.6 kg)   Height: 5' 9"  (1.753 m)        Assessment & Plan:   Terry Vega is a 73 y.o. male Benign essential HTN  -  Stable, tolerating current regimen. No changes.  Continue exercise and watching diet.  Discussed borderline blood sugar declined for prediabetes/diabetic screening in the next 6 months.  Hypothyroidism, unspecified type  -Tolerating current regimen, no changes.  Need for Td vaccine - Plan: Td vaccine greater than or equal to 7yo preservative free IM  -Td vaccine given question of cost and likely out-of-pocket charge.  Consent obtained.  No orders of the defined types were placed in this encounter.  Patient Instructions     No change in meds. Keep up with exercise and watching diet.  recheck blood sugar with prediabetes test in next 6 months. Sooner if new symptoms. Thanks for coming in today.    Preventive Care 62 Years and Older, Male Preventive care refers to lifestyle choices and visits with your health care provider that can promote health and  wellness. What does preventive care include?   A yearly physical exam. This is also called an annual well check.  Dental exams once or twice a year.  Routine eye exams. Ask your health care provider how often you should have your eyes checked.  Personal lifestyle choices, including: ? Daily care of your teeth and gums. ? Regular physical activity. ? Eating a healthy diet. ? Avoiding tobacco and drug use. ? Limiting alcohol use. ? Practicing safe sex. ? Taking low doses of aspirin every day. ? Taking vitamin and mineral supplements as recommended by your health care provider. What happens during an annual well check? The services and screenings done by your health care provider during your annual well check will depend on your age, overall health, lifestyle risk factors, and family history of disease. Counseling Your health care provider may ask you questions about your:  Alcohol use.  Tobacco use.  Drug use.  Emotional well-being.  Home and relationship well-being.  Sexual activity.  Eating habits.  History of falls.  Memory and ability to understand (cognition).  Work and work Statistician. Screening You may have the following tests or measurements:  Height, weight, and BMI.  Blood pressure.  Lipid and cholesterol levels. These may be checked every 5 years, or more frequently if you are over 58 years old.  Skin check.  Lung cancer screening. You may have this screening every year starting at age 37 if you have a 30-pack-year history of smoking and currently smoke or have quit within the past 15 years.  Colorectal cancer screening. All adults should have this screening starting at age 8 and continuing until age 1. You  will have tests every 1-10 years, depending on your results and the type of screening test. People at increased risk should start screening at an earlier age. Screening tests may include: ? Guaiac-based fecal occult blood testing. ? Fecal  immunochemical test (FIT). ? Stool DNA test. ? Virtual colonoscopy. ? Sigmoidoscopy. During this test, a flexible tube with a tiny camera (sigmoidoscope) is used to examine your rectum and lower colon. The sigmoidoscope is inserted through your anus into your rectum and lower colon. ? Colonoscopy. During this test, a long, thin, flexible tube with a tiny camera (colonoscope) is used to examine your entire colon and rectum.  Prostate cancer screening. Recommendations will vary depending on your family history and other risks.  Hepatitis C blood test.  Hepatitis B blood test.  Sexually transmitted disease (STD) testing.  Diabetes screening. This is done by checking your blood sugar (glucose) after you have not eaten for a while (fasting). You may have this done every 1-3 years.  Abdominal aortic aneurysm (AAA) screening. You may need this if you are a current or former smoker.  Osteoporosis. You may be screened starting at age 21 if you are at high risk. Talk with your health care provider about your test results, treatment options, and if necessary, the need for more tests. Vaccines Your health care provider may recommend certain vaccines, such as:  Influenza vaccine. This is recommended every year.  Tetanus, diphtheria, and acellular pertussis (Tdap, Td) vaccine. You may need a Td booster every 10 years.  Varicella vaccine. You may need this if you have not been vaccinated.  Zoster vaccine. You may need this after age 84.  Measles, mumps, and rubella (MMR) vaccine. You may need at least one dose of MMR if you were born in 1957 or later. You may also need a second dose.  Pneumococcal 13-valent conjugate (PCV13) vaccine. One dose is recommended after age 40.  Pneumococcal polysaccharide (PPSV23) vaccine. One dose is recommended after age 57.  Meningococcal vaccine. You may need this if you have certain conditions.  Hepatitis A vaccine. You may need this if you have certain  conditions or if you travel or work in places where you may be exposed to hepatitis A.  Hepatitis B vaccine. You may need this if you have certain conditions or if you travel or work in places where you may be exposed to hepatitis B.  Haemophilus influenzae type b (Hib) vaccine. You may need this if you have certain risk factors. Talk to your health care provider about which screenings and vaccines you need and how often you need them. This information is not intended to replace advice given to you by your health care provider. Make sure you discuss any questions you have with your health care provider. Document Released: 11/27/2015 Document Revised: 12/21/2017 Document Reviewed: 09/01/2015 Elsevier Interactive Patient Education  [employer].   If you have lab work done today you will be contacted with your lab results within the next 2 weeks.  If you have not heard from [place] then please contact us. The fastest way to get your results is to register for My Chart.   IF you received an x-ray today, you will receive an invoice from [facility]. Please contact [facility] at [phone] with questions or concerns regarding your invoice.   IF you received labwork today, you will receive an invoice from [facility]. Please contact [facility] at [phone] with questions or concerns regarding your invoice.   Our billing staff will  not be able to assist you with questions regarding bills from these companies.  You will be contacted with the lab results as soon as they are available. The fastest way to get your results is to activate your My Chart account. Instructions are located on the last page of this paperwork. If you have not heard from [place] regarding the results in 2 weeks, please contact this office.       Signed,   [name], MD [facility].  05/12/19 8:07 AM

## 2019-05-10 NOTE — Patient Instructions (Addendum)
No change in meds. Keep up with exercise and watching diet.  recheck blood sugar with prediabetes test in next 6 months. Sooner if new symptoms. Thanks for coming in today.    Preventive Care 73 Years and Older, Male Preventive care refers to lifestyle choices and visits with your health care provider that can promote health and wellness. What does preventive care include?   A yearly physical exam. This is also called an annual well check.  Dental exams once or twice a year.  Routine eye exams. Ask your health care provider how often you should have your eyes checked.  Personal lifestyle choices, including: ? Daily care of your teeth and gums. ? Regular physical activity. ? Eating a healthy diet. ? Avoiding tobacco and drug use. ? Limiting alcohol use. ? Practicing safe sex. ? Taking low doses of aspirin every day. ? Taking vitamin and mineral supplements as recommended by your health care provider. What happens during an annual well check? The services and screenings done by your health care provider during your annual well check will depend on your age, overall health, lifestyle risk factors, and family history of disease. Counseling Your health care provider may ask you questions about your:  Alcohol use.  Tobacco use.  Drug use.  Emotional well-being.  Home and relationship well-being.  Sexual activity.  Eating habits.  History of falls.  Memory and ability to understand (cognition).  Work and work Statistician. Screening You may have the following tests or measurements:  Height, weight, and BMI.  Blood pressure.  Lipid and cholesterol levels. These may be checked every 5 years, or more frequently if you are over 25 years old.  Skin check.  Lung cancer screening. You may have this screening every year starting at age 73 if you have a 30-pack-year history of smoking and currently smoke or have quit within the past 15 years.  Colorectal cancer  screening. All adults should have this screening starting at age 73 and continuing until age 46. You will have tests every 1-10 years, depending on your results and the type of screening test. People at increased risk should start screening at an earlier age. Screening tests may include: ? Guaiac-based fecal occult blood testing. ? Fecal immunochemical test (FIT). ? Stool DNA test. ? Virtual colonoscopy. ? Sigmoidoscopy. During this test, a flexible tube with a tiny camera (sigmoidoscope) is used to examine your rectum and lower colon. The sigmoidoscope is inserted through your anus into your rectum and lower colon. ? Colonoscopy. During this test, a long, thin, flexible tube with a tiny camera (colonoscope) is used to examine your entire colon and rectum.  Prostate cancer screening. Recommendations will vary depending on your family history and other risks.  Hepatitis C blood test.  Hepatitis B blood test.  Sexually transmitted disease (STD) testing.  Diabetes screening. This is done by checking your blood sugar (glucose) after you have not eaten for a while (fasting). You may have this done every 1-3 years.  Abdominal aortic aneurysm (AAA) screening. You may need this if you are a current or former smoker.  Osteoporosis. You may be screened starting at age 73 if you are at high risk. Talk with your health care provider about your test results, treatment options, and if necessary, the need for more tests. Vaccines Your health care provider may recommend certain vaccines, such as:  Influenza vaccine. This is recommended every year.  Tetanus, diphtheria, and acellular pertussis (Tdap, Td) vaccine. You may need a  Td booster every 10 years.  Varicella vaccine. You may need this if you have not been vaccinated.  Zoster vaccine. You may need this after age 40.  Measles, mumps, and rubella (MMR) vaccine. You may need at least one dose of MMR if you were born in 1957 or later. You may also  need a second dose.  Pneumococcal 13-valent conjugate (PCV13) vaccine. One dose is recommended after age 18.  Pneumococcal polysaccharide (PPSV23) vaccine. One dose is recommended after age 23.  Meningococcal vaccine. You may need this if you have certain conditions.  Hepatitis A vaccine. You may need this if you have certain conditions or if you travel or work in places where you may be exposed to hepatitis A.  Hepatitis B vaccine. You may need this if you have certain conditions or if you travel or work in places where you may be exposed to hepatitis B.  Haemophilus influenzae type b (Hib) vaccine. You may need this if you have certain risk factors. Talk to your health care provider about which screenings and vaccines you need and how often you need them. This information is not intended to replace advice given to you by your health care provider. Make sure you discuss any questions you have with your health care provider. Document Released: 11/27/2015 Document Revised: 12/21/2017 Document Reviewed: 09/01/2015 Elsevier Interactive Patient Education  [employer].   If you have lab work done today you will be contacted with your lab results within the next 2 weeks.  If you have not heard from [place] then please contact us. The fastest way to get your results is to register for My Chart.   IF you received an x-ray today, you will receive an invoice from [facility]. Please contact [facility] at [phone] with questions or concerns regarding your invoice.   IF you received labwork today, you will receive an invoice from [facility]. Please contact [facility] at [phone] with questions or concerns regarding your invoice.   Our billing staff will not be able to assist you with questions regarding bills from these companies.  You will be contacted with the lab results as soon as they are available. The fastest way to get your results is to activate your My Chart  account. Instructions are located on the last page of this paperwork. If you have not heard from [place] regarding the results in 2 weeks, please contact this office.

## 2019-05-14 LAB — PSA: Prostate Specific Ag, Serum: 0.5 ng/mL (ref 0.0–4.0)

## 2019-05-14 LAB — SPECIMEN STATUS REPORT

## 2019-05-30 ENCOUNTER — Other Ambulatory Visit: Payer: Self-pay | Admitting: *Deleted

## 2019-05-30 ENCOUNTER — Encounter: Payer: Self-pay | Admitting: Family Medicine

## 2019-05-30 DIAGNOSIS — E039 Hypothyroidism, unspecified: Secondary | ICD-10-CM

## 2019-05-30 MED ORDER — LEVOTHYROXINE SODIUM 100 MCG PO TABS: 100.0000 ug | ORAL_TABLET | Freq: Every day | ORAL | 1 refills | Status: DC

## 2019-06-18 ENCOUNTER — Encounter: Payer: Self-pay | Admitting: Cardiovascular Disease

## 2019-06-18 ENCOUNTER — Other Ambulatory Visit: Payer: Self-pay

## 2019-06-18 ENCOUNTER — Ambulatory Visit (INDEPENDENT_AMBULATORY_CARE_PROVIDER_SITE_OTHER): Payer: Medicare HMO | Admitting: Cardiovascular Disease

## 2019-06-18 VITALS — BP 120/80 | HR 59 | Temp 98.1°F | Ht 69.0 in | Wt 213.2 lb

## 2019-06-18 DIAGNOSIS — E785 Hyperlipidemia, unspecified: Secondary | ICD-10-CM | POA: Diagnosis not present

## 2019-06-18 DIAGNOSIS — I10 Essential (primary) hypertension: Secondary | ICD-10-CM | POA: Diagnosis not present

## 2019-06-18 DIAGNOSIS — I493 Ventricular premature depolarization: Secondary | ICD-10-CM | POA: Diagnosis not present

## 2019-06-18 MED ORDER — CARVEDILOL 6.25 MG PO TABS: 6.2500 mg | ORAL_TABLET | Freq: Two times a day (BID) | ORAL | 3 refills | Status: DC

## 2019-06-18 MED ORDER — LOSARTAN POTASSIUM-HCTZ 100-25 MG PO TABS: 1.0000 | ORAL_TABLET | Freq: Every day | ORAL | 3 refills | Status: DC

## 2019-06-18 NOTE — Patient Instructions (Signed)
Medication Instructions:  Your physician recommends that you continue on your current medications as directed. Please refer to the Current Medication list given to you today.  If you need a refill on your cardiac medications before your next appointment, please call your pharmacy.   Lab work: None ordered If you have labs (blood work) drawn today and your tests are completely normal, you will receive your results only by: Marland Kitchen. MyChart Message (if you have MyChart) OR . A paper copy in the mail If you have any lab test that is abnormal or we need to change your treatment, we will call you to review the results.  Testing/Procedures: Non ordered  Follow-Up: At [facility], you and your health needs are our priority.  As part of our continuing mission to provide you with exceptional heart care, we have created designated Provider Care Teams.  These Care Teams include your primary Cardiologist (physician) and Advanced Practice Providers (APPs -  Physician Assistants and Nurse Practitioners) who all work together to provide you with the care you need, when you need it. You will need a follow up appointment in 12 months.  Please call our office 2 months in advance to schedule this appointment.  You may see  Dr.Arida  or one of the following Advanced Practice Providers on your designated Care Team:   Nicolasa Duckinghristopher Berge, NP Eula Listenyan Dunn, PA-C . Marisue IvanJacquelyn Visser, PA-C  Any Other Special Instructions Will Be Listed Below (If Applicable).  Heart-Healthy Eating Plan Heart-healthy meal planning includes:  Eating less unhealthy fats.  Eating more healthy fats.  Making other changes in your diet. Talk with your doctor or a diet specialist (dietitian) to create an eating plan that is right for you.  What are tips for following this plan? Cooking Avoid frying your food. Try to bake, boil, grill, or broil it instead. You can also reduce fat by:  Removing the skin from poultry.  Removing all visible  fats from meats.  Steaming vegetables in water or broth. Meal planning   At meals, divide your plate into four equal parts: ? Fill one-half of your plate with vegetables and green salads. ? Fill one-fourth of your plate with whole grains. ? Fill one-fourth of your plate with lean protein foods.  Eat 4-5 servings of vegetables per day. A serving of vegetables is: ? 1 cup of raw or cooked vegetables. ? 2 cups of raw leafy greens.  Eat 4-5 servings of fruit per day. A serving of fruit is: ? 1 medium whole fruit. ?  cup of dried fruit. ?  cup of fresh, frozen, or canned fruit. ?  cup of 100% fruit juice.  Eat more foods that have soluble fiber. These are apples, broccoli, carrots, beans, peas, and barley. Try to get 20-30 g of fiber per day.  Eat 4-5 servings of nuts, legumes, and seeds per week: ? 1 serving of dried beans or legumes equals  cup after being cooked. ? 1 serving of nuts is  cup. ? 1 serving of seeds equals 1 tablespoon. General information  Eat more home-cooked food. Eat less restaurant, buffet, and fast food.  Limit or avoid alcohol.  Limit foods that are high in starch and sugar.  Avoid fried foods.  Lose weight if you are overweight.  Keep track of how much salt (sodium) you eat. This is important if you have high blood pressure. Ask your doctor to tell you more about this.  Try to add vegetarian meals each week. Fats  Choose healthy fats. These include olive oil and canola oil, flaxseeds, walnuts, almonds, and seeds.  Eat more omega-3 fats. These include salmon, mackerel, sardines, tuna, flaxseed oil, and ground flaxseeds. Try to eat fish at least 2 times each week.  Check food labels. Avoid foods with trans fats or high amounts of saturated fat.  Limit saturated fats. ? These are often found in animal products, such as meats, butter, and cream. ? These are also found in plant foods, such as palm oil, palm kernel oil, and coconut oil.  Avoid  foods with partially hydrogenated oils in them. These have trans fats. Examples are stick margarine, some tub margarines, cookies, crackers, and other baked goods. What foods can I eat? Fruits All fresh, canned (in natural juice), or frozen fruits. Vegetables Fresh or frozen vegetables (raw, steamed, roasted, or grilled). Green salads. Grains Most grains. Choose whole wheat and whole grains most of the time. Rice and pasta, including brown rice and pastas made with whole wheat. Meats and other proteins Lean, well-trimmed beef, veal, pork, and lamb. Chicken and [place] without skin. All fish and shellfish. Wild duck, rabbit, pheasant, and venison. Egg whites or low-cholesterol egg substitutes. Dried beans, peas, lentils, and tofu. Seeds and most nuts. Dairy Low-fat or nonfat cheeses, including ricotta and mozzarella. Skim or 1% milk that is liquid, powdered, or evaporated. Buttermilk that is made with low-fat milk. Nonfat or low-fat yogurt. Fats and oils Non-hydrogenated (trans-free) margarines. Vegetable oils, including soybean, sesame, sunflower, olive, peanut, safflower, corn, canola, and cottonseed. Salad dressings or mayonnaise made with a vegetable oil. Beverages Mineral water. Coffee and tea. Diet carbonated beverages. Sweets and desserts Sherbet, gelatin, and fruit ice. Small amounts of dark chocolate. Limit all sweets and desserts. Seasonings and condiments All seasonings and condiments. The items listed above may not be a complete list of foods and drinks you can eat. Contact a dietitian for more options. What foods should I avoid? Fruits Canned fruit in heavy syrup. Fruit in cream or butter sauce. Fried fruit. Limit coconut. Vegetables Vegetables cooked in cheese, cream, or butter sauce. Fried vegetables. Grains Breads that are made with saturated or trans fats, oils, or whole milk. Croissants. Sweet rolls. Donuts. High-fat crackers, such as cheese crackers. Meats and other  proteins Fatty meats, such as hot dogs, ribs, sausage, bacon, rib-eye roast or steak. High-fat deli meats, such as salami and bologna. Caviar. Domestic duck and goose. Organ meats, such as liver. Dairy Cream, sour cream, cream cheese, and creamed cottage cheese. Whole-milk cheeses. Whole or 2% milk that is liquid, evaporated, or condensed. Whole buttermilk. Cream sauce or high-fat cheese sauce. Yogurt that is made from whole milk. Fats and oils Meat fat, or shortening. Cocoa butter, hydrogenated oils, palm oil, coconut oil, palm kernel oil. Solid fats and shortenings, including bacon fat, salt pork, lard, and butter. Nondairy cream substitutes. Salad dressings with cheese or sour cream. Beverages Regular sodas and juice drinks with added sugar. Sweets and desserts Frosting. Pudding. Cookies. Cakes. Pies. Milk chocolate or white chocolate. Buttered syrups. Full-fat ice cream or ice cream drinks. The items listed above may not be a complete list of foods and drinks to avoid. Contact a dietitian for more information. Summary  Heart-healthy meal planning includes eating less unhealthy fats, eating more healthy fats, and making other changes in your diet.  Eat a balanced diet. This includes fruits and vegetables, low-fat or nonfat dairy, lean protein, nuts and legumes, whole grains, and heart-healthy oils and fats. This information is  not intended to replace advice given to you by your health care provider. Make sure you discuss any questions you have with your health care provider. Document Released: 05/01/2012 Document Revised: 01/04/2018 Document Reviewed: 12/08/2017 Elsevier Patient Education  2020 [employer]

## 2019-06-18 NOTE — Progress Notes (Signed)
Cardiology Office Note   Date:  06/18/2019   ID:  Terry Vega, DOB 04-07-1946, MRN [id number]  PCP:  [name], [name], MD  Cardiologist:   Lorine BearsMuhammad Arida, MD   Chief Complaint  Patient presents with  . other    12 month follow up. Meds reviewed by the pt. verbally. "doing well."       History of Present Illness: Terry Vega is a 73 y.o. male who presents for a followup visit regarding  PVCs and lower extremity edema. He has prolonged history of hypertension. He has hypothyroidism and has been on replacement therapy for many years.  He was seen in 2014 for unifocal PVCs. He had an echocardiogram done in December of 2014 which showed normal LV systolic function, mildly dilated left atrium and mild aortic regurgitation. Nuclear stress test showed no evidence of ischemia. He had worsening leg edema and diltiazem which improved after switching to carvedilol.  He also has history of posttussive syncope.  He has been doing extremely well with no recent chest pain, shortness of breath or palpitations.  His blood pressure has been well controlled.  Past Medical History:  Diagnosis Date  . Allergy   . Heart murmur   . Hypertension   . Thyroid disease     Past Surgical History:  Procedure Laterality Date  . COLONOSCOPY    . HEMORRHOID SURGERY    . VASECTOMY       Current Outpatient Medications  Medication Sig Dispense Refill  . aspirin 81 MG tablet Take 81 mg by mouth daily.    . carvedilol (COREG) 6.25 MG tablet TAKE 1 TABLET TWICE DAILY 180 tablet 0  . cholecalciferol (VITAMIN D) 1000 UNITS tablet Take 1,000 Units by mouth daily.    . Cyanocobalamin (VITAMIN B12 PO) Take 1,000 mg by mouth 2 (two) times daily.     Marland Kitchen. levothyroxine (SYNTHROID) 100 MCG tablet Take 1 tablet (100 mcg total) by mouth daily. 90 tablet 1  . loratadine (CLARITIN) 10 MG tablet Take 10 mg by mouth daily as needed.     Marland Kitchen. losartan-hydrochlorothiazide (HYZAAR) 100-25 MG tablet TAKE 1 TABLET  EVERY DAY 90 tablet 0  . Multiple Vitamin (MULTIVITAMIN) tablet Take 1 tablet by mouth daily.    . Naproxen Sodium (ALEVE PO) Take by mouth as needed.    Marland Kitchen. PREVIDENT 5000 SENSITIVE 1.1-5 % PSTE     . vitamin E 1000 UNIT capsule Take 1,000 Units by mouth daily.     No current facility-administered medications for this visit.     Allergies:   Patient has no known allergies.    Social History:  The patient  reports that he has never smoked. He has never used smokeless tobacco. He reports current alcohol use. He reports that he does not use drugs.   Family History:  The patient's family history includes Cancer in his father; Diabetes in his father and maternal grandmother; Heart disease in his mother; Stroke in his maternal grandmother.    ROS:  Please see the history of present illness.   Otherwise, review of systems are positive for none.   All other systems are reviewed and negative.    PHYSICAL EXAM: VS:  BP 120/80 (BP Location: Left Arm, Patient Position: Sitting, Cuff Size: Normal)   Pulse (!) 59   Temp 98.1 F (36.7 C)   Ht 5\' 9"  (1.753 m)   Wt 213 lb 4 oz (96.7 kg)   BMI 31.49 kg/m  , BMI  Body mass index is 31.49 kg/m. GEN: Well nourished, well developed, in no acute distress  HEENT: normal  Neck: no JVD, carotid bruits, or masses Cardiac: RRR; no murmurs, rubs, or gallops,no edema  Respiratory:  clear to auscultation bilaterally, normal work of breathing GI: soft, nontender, nondistended, + BS MS: no deformity or atrophy  Skin: warm and dry, no rash Neuro:  Strength and sensation are intact Psych: euthymic mood, full affect   EKG:  EKG is ordered today. The ekg ordered today demonstrates sinus bradycardia with no significant ST or T wave changes.  Heart rate is 59 bpm   Recent Labs: 05/03/2019: ALT 17; BUN 15; Creatinine, Ser 0.99; Potassium 4.0; Sodium 141; TSH 3.510    Lipid Panel    Component Value Date/Time   CHOL 169 05/03/2019 1100   TRIG 95 05/03/2019  1100   HDL 41 05/03/2019 1100   CHOLHDL 4.1 05/03/2019 1100   CHOLHDL 4.2 03/17/2016 0814   VLDL 19 03/17/2016 0814   LDLCALC 109 (H) 05/03/2019 1100      Wt Readings from Last 3 Encounters:  06/18/19 213 lb 4 oz (96.7 kg)  05/10/19 210 lb 12.8 oz (95.6 kg)  04/30/19 209 lb (94.8 kg)     No flowsheet data found.    ASSESSMENT AND PLAN:  1.  PVCs: No evidence of recurrent palpitations on carvedilol.  2. Essential hypertension: Blood pressure is well controlled on current medications.  I reviewed his recent labs in June which were unremarkable  3.  Mild hyperlipidemia: Most recent lipid profile showed an LDL of 109.  His LDL has been mildly elevated throughout the last few years.  I do not think there is an indication for treatment with a statin yet.  I discussed with him the importance of heart healthy diet.   Disposition:   FU with me in 1 year  Signed,  Kathlyn [place], MD  06/18/2019 3:21 PM    [place]

## 2019-11-04 ENCOUNTER — Ambulatory Visit: Payer: Medicare HMO | Admitting: Family Medicine

## 2019-11-05 ENCOUNTER — Other Ambulatory Visit: Payer: Self-pay | Admitting: Family Medicine

## 2019-11-05 DIAGNOSIS — E039 Hypothyroidism, unspecified: Secondary | ICD-10-CM

## 2020-01-09 ENCOUNTER — Ambulatory Visit: Payer: Medicare HMO

## 2020-01-23 ENCOUNTER — Ambulatory Visit (INDEPENDENT_AMBULATORY_CARE_PROVIDER_SITE_OTHER): Payer: Medicare HMO | Admitting: Family Medicine

## 2020-01-23 ENCOUNTER — Encounter: Payer: Self-pay | Admitting: Family Medicine

## 2020-01-23 ENCOUNTER — Other Ambulatory Visit: Payer: Self-pay

## 2020-01-23 VITALS — BP 134/78 | HR 62 | Temp 98.6°F | Ht 69.0 in | Wt 215.0 lb

## 2020-01-23 DIAGNOSIS — I10 Essential (primary) hypertension: Secondary | ICD-10-CM | POA: Diagnosis not present

## 2020-01-23 DIAGNOSIS — E785 Hyperlipidemia, unspecified: Secondary | ICD-10-CM | POA: Diagnosis not present

## 2020-01-23 DIAGNOSIS — E039 Hypothyroidism, unspecified: Secondary | ICD-10-CM | POA: Diagnosis not present

## 2020-01-23 MED ORDER — LEVOTHYROXINE SODIUM 100 MCG PO TABS: 100.0000 ug | ORAL_TABLET | Freq: Every day | ORAL | 3 refills | Status: DC

## 2020-01-23 NOTE — Patient Instructions (Addendum)
No med changes today. Thanks for coming in and stay safe.   To schedule Covid-19 testing, text "COVID" to [phone].  Another option is to log on to [email or web address] to make an on-line appointment.  If assistance needed with this process, please call [phone].    If you have lab work done today you will be contacted with your lab results within the next 2 weeks.  If you have not heard from [place] then please contact us. The fastest way to get your results is to register for My Chart.   IF you received an x-ray today, you will receive an invoice from [facility]. Please contact [facility] at [phone] with questions or concerns regarding your invoice.   IF you received labwork today, you will receive an invoice from [facility]. Please contact [facility] at [phone] with questions or concerns regarding your invoice.   Our billing staff will not be able to assist you with questions regarding bills from these companies.  You will be contacted with the lab results as soon as they are available. The fastest way to get your results is to activate your My Chart account. Instructions are located on the last page of this paperwork. If you have not heard from [place] regarding the results in 2 weeks, please contact this office.

## 2020-01-23 NOTE — Progress Notes (Signed)
Subjective:  Patient ID: Terry Vega, male    DOB: Jun 14, 1946  Age: 74 y.o. MRN: [id number]  CC:  Chief Complaint  Patient presents with  . Follow-up     on hypertension. pt stateshis BP has been going up a little, but his BP has been staying in the 145 area. pt checks BP daily. pt has been doing his best to avoid salty foods and sweets.    HPI Terry Vega presents for    Hypertension: Carvedilol 6.25 mg twice daily, losartan HCTZ 100/25 mg daily.  History of PVCs.  On aspirin 81 daily, Coreg as above.  Cardiology appointment with Dr. Kirke Corin on June 18, 2019.  No recurrence of PVCs on carvedilol.  S/p both covid vaccines. [place] trip at end of the month. Will need covid vaccine.   Home readings: 145-80 on home reading.   BP Readings from Last 3 Encounters:  01/23/20 134/78  06/18/19 120/80  05/10/19 130/72   Lab Results  Component Value Date   CREATININE 0.99 05/03/2019   Hyperlipidemia: Borderline hyperlipidemia with LDL 109, no apparent indication of statin.  Heart healthy diet discussed at cardiology visit.  Lab Results  Component Value Date   CHOL 169 05/03/2019   HDL 41 05/03/2019   LDLCALC 109 (H) 05/03/2019   TRIG 95 05/03/2019   CHOLHDL 4.1 05/03/2019   Lab Results  Component Value Date   ALT 17 05/03/2019   AST 21 05/03/2019   ALKPHOS 42 05/03/2019   BILITOT 0.4 05/03/2019   Wt Readings from Last 3 Encounters:  01/23/20 215 lb (97.5 kg)  06/18/19 213 lb 4 oz (96.7 kg)  05/10/19 210 lb 12.8 oz (95.6 kg)     Hypothyroidism: Lab Results  Component Value Date   TSH 3.510 05/03/2019  Taking medication daily.  Synthroid 100 mcg. No new hot or cold intolerance. No new hair or skin changes, heart palpitations or new fatigue. No new weight changes.   Health maintenance reviewed, no concerns.   History Patient Active Problem List   Diagnosis Date Noted  . Leg edema 05/06/2014  . PVC (premature ventricular contraction) 10/15/2013  .  Hypertension 10/08/2012  . Hypothyroid 10/08/2012   Past Medical History:  Diagnosis Date  . Allergy   . Heart murmur   . Hypertension   . Thyroid disease    Past Surgical History:  Procedure Laterality Date  . COLONOSCOPY    . HEMORRHOID SURGERY    . VASECTOMY     No Known Allergies Prior to Admission medications   Medication Sig Start Date End Date Taking? Authorizing Provider  aspirin 81 MG tablet Take 81 mg by mouth daily.   Yes [provider]  carvedilol (COREG) 6.25 MG tablet Take 1 tablet (6.25 mg total) by mouth 2 (two) times daily. 06/18/19  Yes Iran Ouch, MD  cholecalciferol (VITAMIN D) 1000 UNITS tablet Take 1,000 Units by mouth daily.   Yes [provider]  Cyanocobalamin (VITAMIN B12 PO) Take 1,000 mg by mouth 2 (two) times daily.    Yes [provider]  levothyroxine (SYNTHROID) 100 MCG tablet TAKE 1 TABLET EVERY DAY 11/05/19  Yes Shade Flood, MD  loratadine (CLARITIN) 10 MG tablet Take 10 mg by mouth daily as needed.    Yes [provider]  losartan-hydrochlorothiazide (HYZAAR) 100-25 MG tablet Take 1 tablet by mouth daily. 06/18/19  Yes [place] Ouch, MD  Multiple Vitamin (MULTIVITAMIN) tablet Take 1 tablet by mouth daily.  Yes [provider]  Naproxen Sodium (ALEVE PO) Take by mouth as needed.   Yes [provider]  PREVIDENT 5000 SENSITIVE 1.1-5 % PSTE  04/10/19  Yes [provider]  vitamin E 1000 UNIT capsule Take 1,000 Units by mouth daily.   Yes [provider]   Social History   Socioeconomic History  . Marital status: Married    Spouse name: Not on file  . Number of children: Not on file  . Years of education: Not on file  . Highest education level: Not on file  Occupational History  . Not on file  Tobacco Use  . Smoking status: Never Smoker  . Smokeless tobacco: Never Used  Substance and Sexual Activity  . Alcohol use: Yes    Comment: occasionally  . Drug  use: No  . Sexual activity: Yes    Birth control/protection: None  Other Topics Concern  . Not on file  Social History Narrative  . Not on file   Social Determinants of Health   Financial Resource Strain:   . Difficulty of Paying Living Expenses:   Food Insecurity:   . Worried About Programme researcher, broadcasting/film/video in the Last Year:   . Barista in the Last Year:   Transportation Needs:   . Freight forwarder (Medical):   Marland Kitchen Lack of Transportation (Non-Medical):   Physical Activity:   . Days of Exercise per Week:   . Minutes of Exercise per Session:   Stress:   . Feeling of Stress :   Social Connections:   . Frequency of Communication with Friends and Family:   . Frequency of Social Gatherings with Friends and Family:   . Attends Religious Services:   . Active Member of Clubs or Organizations:   . Attends Banker Meetings:   Marland Kitchen Marital Status:   Intimate Partner Violence:   . Fear of Current or Ex-Partner:   . Emotionally Abused:   Marland Kitchen Physically Abused:   . Sexually Abused:     Review of Systems  Constitutional: Negative for fatigue and unexpected weight change.  Eyes: Negative for visual disturbance.  Respiratory: Negative for cough, chest tightness and shortness of breath.   Cardiovascular: Negative for chest pain, palpitations and leg swelling.  Gastrointestinal: Negative for abdominal pain and blood in stool.  Neurological: Negative for dizziness, light-headedness and headaches.     Objective:   Vitals:   01/23/20 1116 01/23/20 1140  BP: (!) 152/79 134/78  Pulse: 62   Temp: 98.6 F (37 C)   TempSrc: Temporal   SpO2: 95%   Weight: 215 lb (97.5 kg)   Height: 5\' 9"  (1.753 m)      Physical Exam Vitals reviewed.  Constitutional:      Appearance: He is well-developed.  HENT:     Head: Normocephalic and atraumatic.  Eyes:     Pupils: Pupils are equal, round, and reactive to light.  Neck:     Vascular: No carotid bruit or JVD.    Cardiovascular:     Rate and Rhythm: Normal rate and regular rhythm.     Heart sounds: Normal heart sounds. No murmur.  Pulmonary:     Effort: Pulmonary effort is normal.     Breath sounds: Normal breath sounds. No rales.  Skin:    General: Skin is warm and dry.  Neurological:     Mental Status: He is alert and oriented to person, place, and time.  Assessment & Plan:  Terry Vega is a 74 y.o. male . Benign essential HTN - Plan: Comprehensive metabolic panel  - Stable, tolerating current regimen. Medications refilled. Labs pending as above.   - prior PVCs resolved.   Hypothyroidism, unspecified type - Plan: TSH, levothyroxine (SYNTHROID) 100 MCG tablet  - stable by symptoms.  tolerating current regimen. Medications refilled. Labs pending as above.   Hyperlipidemia, unspecified hyperlipidemia type - Plan: Lipid panel  - mild elevation prior. Repeat testing.   Meds ordered this encounter  Medications  . levothyroxine (SYNTHROID) 100 MCG tablet    Sig: Take 1 tablet (100 mcg total) by mouth daily.    Dispense:  90 tablet    Refill:  3   Patient Instructions   No med changes today. Thanks for coming in and stay safe.   To schedule Covid-19 testing, text "COVID" to [phone].  Another option is to log on to [email or web address] to make an on-line appointment.  If assistance needed with this process, please call [phone].    If you have lab work done today you will be contacted with your lab results within the next 2 weeks.  If you have not heard from [place] then please contact us. The fastest way to get your results is to register for My Chart.   IF you received an x-ray today, you will receive an invoice from [facility]. Please contact [facility] at [phone] with questions or concerns regarding your invoice.   IF you received labwork today, you will receive an invoice from [place]. Please contact [facility] at [phone] with questions  or concerns regarding your invoice.   Our billing staff will not be able to assist you with questions regarding bills from these companies.  You will be contacted with the lab results as soon as they are available. The fastest way to get your results is to activate your My Chart account. Instructions are located on the last page of this paperwork. If you have not heard from [place] regarding the results in 2 weeks, please contact this office.         Signed, [name], MD [facility]

## 2020-01-24 ENCOUNTER — Encounter: Payer: Self-pay | Admitting: Family Medicine

## 2020-01-24 LAB — COMPREHENSIVE METABOLIC PANEL
ALT: 16 IU/L (ref 0–44)
AST: 22 IU/L (ref 0–40)
Albumin/Globulin Ratio: 1.7 (ref 1.2–2.2)
Albumin: 4.3 g/dL (ref 3.7–4.7)
Alkaline Phosphatase: 41 IU/L (ref 39–117)
BUN/Creatinine Ratio: 13 (ref 10–24)
BUN: 12 mg/dL (ref 8–27)
Bilirubin Total: 0.5 mg/dL (ref 0.0–1.2)
CO2: 25 mmol/L (ref 20–29)
Calcium: 9.4 mg/dL (ref 8.6–10.2)
Chloride: 100 mmol/L (ref 96–106)
Creatinine, Ser: 0.96 mg/dL (ref 0.76–1.27)
GFR calc Af Amer: 90 mL/min/{1.73_m2} (ref >59)
GFR calc non Af Amer: 78 mL/min/{1.73_m2} (ref >59)
Globulin, Total: 2.6 g/dL (ref 1.5–4.5)
Glucose: 88 mg/dL (ref 65–99)
Potassium: 4.1 mmol/L (ref 3.5–5.2)
Sodium: 140 mmol/L (ref 134–144)
Total Protein: 6.9 g/dL (ref 6.0–8.5)

## 2020-01-24 LAB — LIPID PANEL
Chol/HDL Ratio: 4 ratio (ref 0.0–5.0)
Cholesterol, Total: 162 mg/dL (ref 100–199)
HDL: 41 mg/dL (ref >39)
LDL Chol Calc (NIH): 102 mg/dL — ABNORMAL HIGH (ref 0–99)
Triglycerides: 102 mg/dL (ref 0–149)
VLDL Cholesterol Cal: 19 mg/dL (ref 5–40)

## 2020-01-24 LAB — TSH: TSH: 2.86 u[IU]/mL (ref 0.450–4.500)

## 2020-01-24 NOTE — Telephone Encounter (Signed)
Pt would like a PSA added to his lab work from yesterday 01/23/20. Please advise

## 2020-01-27 DIAGNOSIS — H524 Presbyopia: Secondary | ICD-10-CM | POA: Diagnosis not present

## 2020-02-04 DIAGNOSIS — Z20828 Contact with and (suspected) exposure to other viral communicable diseases: Secondary | ICD-10-CM | POA: Diagnosis not present

## 2020-06-30 ENCOUNTER — Other Ambulatory Visit: Payer: Self-pay

## 2020-06-30 ENCOUNTER — Ambulatory Visit: Payer: Medicare HMO | Admitting: Cardiovascular Disease

## 2020-06-30 ENCOUNTER — Encounter: Payer: Self-pay | Admitting: Cardiovascular Disease

## 2020-06-30 VITALS — BP 126/80 | HR 55 | Ht 69.0 in | Wt 216.6 lb

## 2020-06-30 DIAGNOSIS — I10 Essential (primary) hypertension: Secondary | ICD-10-CM

## 2020-06-30 DIAGNOSIS — I493 Ventricular premature depolarization: Secondary | ICD-10-CM | POA: Diagnosis not present

## 2020-06-30 MED ORDER — LOSARTAN POTASSIUM-HCTZ 100-25 MG PO TABS: 1.0000 | ORAL_TABLET | Freq: Every day | ORAL | 3 refills | Status: DC

## 2020-06-30 MED ORDER — CARVEDILOL 6.25 MG PO TABS: 6.2500 mg | ORAL_TABLET | Freq: Two times a day (BID) | ORAL | 3 refills | Status: DC

## 2020-06-30 NOTE — Progress Notes (Signed)
Cardiology Office Note   Date:  06/30/2020   ID:  Terry Vega, DOB 1946-02-04, MRN [id number]  PCP:  Shade Flood, MD  Cardiologist:   Lorine Bears, MD   Chief Complaint  Patient presents with  . Follow-up    1 Year follow up. Medications verbally reviewed with patient.       History of Present Illness: Terry Vega is a 74 y.o. male who presents for a followup visit regarding  PVCs and lower extremity edema. He has prolonged history of hypertension. He has hypothyroidism and has been on replacement therapy for many years.  He was seen in 2014 for unifocal PVCs. He had an echocardiogram done in December of 2014 which showed normal LV systolic function, mildly dilated left atrium and mild aortic regurgitation. Nuclear stress test showed no evidence of ischemia. He had worsening leg edema and diltiazem which improved after switching to carvedilol.  He also has history of posttussive syncope.  He has been doing extremely well with no recent chest pain, shortness of breath or palpitations.  His blood pressure has been well controlled.  Past Medical History:  Diagnosis Date  . Allergy   . Heart murmur   . Hypertension   . Thyroid disease     Past Surgical History:  Procedure Laterality Date  . COLONOSCOPY    . HEMORRHOID SURGERY    . VASECTOMY       Current Outpatient Medications  Medication Sig Dispense Refill  . aspirin 81 MG tablet Take 81 mg by mouth daily.    . carvedilol (COREG) 6.25 MG tablet Take 1 tablet (6.25 mg total) by mouth 2 (two) times daily. 180 tablet 3  . cholecalciferol (VITAMIN D) 1000 UNITS tablet Take 1,000 Units by mouth daily.    . Cyanocobalamin (VITAMIN B12 PO) Take 1,000 mg by mouth 2 (two) times daily.     Marland Kitchen levothyroxine (SYNTHROID) 100 MCG tablet Take 1 tablet (100 mcg total) by mouth daily. 90 tablet 3  . loratadine (CLARITIN) 10 MG tablet Take 10 mg by mouth daily as needed.     Marland Kitchen losartan-hydrochlorothiazide (HYZAAR)  100-25 MG tablet Take 1 tablet by mouth daily. 90 tablet 3  . Multiple Vitamin (MULTIVITAMIN) tablet Take 1 tablet by mouth daily.    . Naproxen Sodium (ALEVE PO) Take by mouth as needed.    Marland Kitchen PREVIDENT 5000 SENSITIVE 1.1-5 % PSTE     . vitamin E 1000 UNIT capsule Take 1,000 Units by mouth daily.     No current facility-administered medications for this visit.    Allergies:   Patient has no known allergies.    Social History:  The patient  reports that he has never smoked. He has never used smokeless tobacco. He reports current alcohol use. He reports that he does not use drugs.   Family History:  The patient's family history includes Cancer in his father; Diabetes in his father and maternal grandmother; Heart disease in his mother; Stroke in his maternal grandmother.    ROS:  Please see the history of present illness.   Otherwise, review of systems are positive for none.   All other systems are reviewed and negative.    PHYSICAL EXAM: VS:  BP 126/80 (BP Location: Left Arm, Patient Position: Sitting, Cuff Size: Normal)   Pulse (!) 55   Ht 5\' 9"  (1.753 m)   Wt 216 lb 9.6 oz (98.2 kg)   SpO2 96%   BMI 31.99 kg/m  ,  BMI Body mass index is 31.99 kg/m. GEN: Well nourished, well developed, in no acute distress  HEENT: normal  Neck: no JVD, carotid bruits, or masses Cardiac: RRR; no murmurs, rubs, or gallops,no edema  Respiratory:  clear to auscultation bilaterally, normal work of breathing GI: soft, nontender, nondistended, + BS MS: no deformity or atrophy  Skin: warm and dry, no rash Neuro:  Strength and sensation are intact Psych: euthymic mood, full affect   EKG:  EKG is ordered today. The ekg ordered today demonstrates sinus bradycardia with no significant ST or T wave changes.  Heart rate is 55 bpm   Recent Labs: 01/23/2020: ALT 16; BUN 12; Creatinine, Ser 0.96; Potassium 4.1; Sodium 140; TSH 2.860    Lipid Panel    Component Value Date/Time   CHOL 162 01/23/2020  1342   TRIG 102 01/23/2020 1342   HDL 41 01/23/2020 1342   CHOLHDL 4.0 01/23/2020 1342   CHOLHDL 4.2 03/17/2016 0814   VLDL 19 03/17/2016 0814   LDLCALC 102 (H) 01/23/2020 1342      Wt Readings from Last 3 Encounters:  06/30/20 216 lb 9.6 oz (98.2 kg)  01/23/20 215 lb (97.5 kg)  06/18/19 213 lb 4 oz (96.7 kg)     No flowsheet data found.    ASSESSMENT AND PLAN:  1.  PVCs: No evidence of recurrent palpitations on carvedilol.  2. Essential hypertension: Blood pressure is well controlled on current medications.  I reviewed his recent labs in March which were unremarkable.  I refilled his medications.  3.  Mild hyperlipidemia: Most recent lipid profile showed improvement in LDL down to 102 without medications.   Disposition:   FU with me in 1 year  Signed,  Lorine Bears, MD  06/30/2020 4:20 PM    [facility]

## 2020-06-30 NOTE — Patient Instructions (Addendum)
Medication Instructions:  Your physician recommends that you continue on your current medications as directed. Please refer to the Current Medication list given to you today.   Your cardiac medications have been refilled today.  *If you need a refill on your cardiac medications before your next appointment, please call your pharmacy*   Lab Work: None ordered If you have labs (blood work) drawn today and your tests are completely normal, you will receive your results only by: Marland Kitchen MyChart Message (if you have MyChart) OR . A paper copy in the mail If you have any lab test that is abnormal or we need to change your treatment, we will call you to review the results.   Testing/Procedures: None ordered   Follow-Up: At [facility], you and your health needs are our priority.  As part of our continuing mission to provide you with exceptional heart care, we have created designated Provider Care Teams.  These Care Teams include your primary Cardiologist (physician) and Advanced Practice Providers (APPs -  Physician Assistants and Nurse Practitioners) who all work together to provide you with the care you need, when you need it.  We recommend signing up for the patient portal called "MyChart".  Sign up information is provided on this After Visit Summary.  MyChart is used to connect with patients for Virtual Visits (Telemedicine).  Patients are able to view lab/test results, encounter notes, upcoming appointments, etc.  Non-urgent messages can be sent to your provider as well.   To learn more about what you can do with MyChart, go to [email or web address].    Your next appointment:   12 month(s)  The format for your next appointment:   In Person  Provider:    You may see  Dr. Kirke Corin or one of the following Advanced Practice Providers on your designated Care Team:    Nicolasa Ducking, NP  Eula Listen, PA-C  Marisue Ivan, PA-C    Other Instructions N/A

## 2020-07-23 ENCOUNTER — Ambulatory Visit (INDEPENDENT_AMBULATORY_CARE_PROVIDER_SITE_OTHER): Payer: Medicare HMO | Admitting: Family Medicine

## 2020-07-23 ENCOUNTER — Encounter: Payer: Self-pay | Admitting: Family Medicine

## 2020-07-23 ENCOUNTER — Other Ambulatory Visit: Payer: Self-pay

## 2020-07-23 VITALS — BP 145/82 | HR 56 | Temp 98.0°F | Ht 69.0 in | Wt 214.0 lb

## 2020-07-23 DIAGNOSIS — Z0001 Encounter for general adult medical examination with abnormal findings: Secondary | ICD-10-CM

## 2020-07-23 DIAGNOSIS — I10 Essential (primary) hypertension: Secondary | ICD-10-CM | POA: Diagnosis not present

## 2020-07-23 DIAGNOSIS — E039 Hypothyroidism, unspecified: Secondary | ICD-10-CM

## 2020-07-23 DIAGNOSIS — Z0000 Encounter for general adult medical examination without abnormal findings: Secondary | ICD-10-CM

## 2020-07-23 DIAGNOSIS — Z23 Encounter for immunization: Secondary | ICD-10-CM

## 2020-07-23 MED ORDER — LEVOTHYROXINE SODIUM 100 MCG PO TABS: 100.0000 ug | ORAL_TABLET | Freq: Every day | ORAL | 3 refills | Status: DC

## 2020-07-23 NOTE — Progress Notes (Signed)
Subjective:  Patient ID: Terry Vega, male    DOB: Jun 30, 1946  Age: 74 y.o. MRN: [id number]  CC:  Chief Complaint  Patient presents with  . Medicare Wellness    patient voices no concerns at this time. states shecks BP at home usual reads 130's/ 70"s / is fasting    HPI [name] presents for  Wellness exam.  Care team: Cardiology, Dr. Fletcher Anon Ophthalmology, Dr. Sabra Heck  Hypertension: With history of PVCs, lower extremity edema, saw cardiology August 17.  Previous edema on diltiazem, improved after switching to carvedilol.  PVCs also improved.  1 year follow-up planned. Still on carvedilol 6.25 mg twice daily, losartan HCTZ 100/25 mg daily Borderline LDL with reading of 102 on March 11, but overall cholesterol numbers were normal. Home readings:130/70's. No new side effects.  BP Readings from Last 3 Encounters:  07/23/20 (!) 145/82  06/30/20 126/80  01/23/20 134/78   Lab Results  Component Value Date   CREATININE 0.96 01/23/2020   Hypothyroidism: Lab Results  Component Value Date   TSH 2.860 01/23/2020  Taking medication daily.  Synthroid 100 mcg QD. Taking with coffee.  No new hot or cold intolerance. No new hair or skin changes, heart palpitations or new fatigue. No new weight changes.   Fall Risk  07/23/2020 01/23/2020 05/10/2019 04/30/2019 10/23/2018  Falls in the past year? 0 0 0 0 0  Number falls in past yr: - - 0 - -  Injury with Fall? 0 - 0 0 -  Follow up Falls evaluation completed Falls evaluation completed Falls evaluation completed Falls evaluation completed;Education provided;Falls prevention discussed -  Adequate lighting at home Stairs - 1 flight, with grab  Bar. Feels stable. Rare ladder use.  Grab bars in bathroom - none.  Loose rugs - few.   Depression screen [facility] 2/9 07/23/2020 01/23/2020 05/10/2019 04/30/2019 10/23/2018  Decreased Interest 0 0 0 0 0  Down, Depressed, Hopeless 0 0 0 0 0  PHQ - 2 Score 0 0 0 0 0   Immunization History   Administered Date(s) Administered  . Influenza Split 08/13/2014  . Influenza, High Dose Seasonal PF 08/29/2017, 08/23/2018, 08/14/2019  . Influenza,inj,Quad PF,6+ Mos 10/09/2013  . Influenza-Unspecified 08/24/2016, 08/29/2018  . Moderna SARS-COVID-2 Vaccination 12/19/2019, 01/14/2020  . Pneumococcal Conjugate-13 09/11/2014  . Pneumococcal Polysaccharide-23 04/20/2017  . Td 12/23/2008, 05/10/2019  . Zoster 04/03/2016, 07/13/2016   Functional Status Survey: Is the patient deaf or have difficulty hearing?: Yes (worked around Investment banker, operational / has mild hearing loss/ has hearing aid) Does the patient have difficulty seeing, even when wearing glasses/contacts?: Yes (reading glasses) Does the patient have difficulty concentrating, remembering, or making decisions?: No Does the patient have difficulty walking or climbing stairs?: No Does the patient have difficulty dressing or bathing?: No Does the patient have difficulty doing errands alone such as visiting a doctor's office or shopping?: No  6CIT Screen 07/23/2020 04/30/2019 04/23/2018  What Year? 0 points 0 points 0 points  What month? 0 points 0 points 0 points  What time? 0 points 0 points 0 points  Count back from 20 0 points 0 points 0 points  Months in reverse 0 points 0 points 0 points  Repeat phrase 0 points 0 points 0 points  Total Score 0 0 0     Office Visit from 07/23/2020 in Primary [facility] at [facility]  AUDIT-C Score 3      Hearing Screening   _0  _1  _2  _3  _4  _5  _6  _7  _8   Right  ear:           Left ear:             Visual Acuity Screening   Right eye Left eye Both eyes  Without correction: 2040 2040 2040  With correction:     optho once per year.   Dental: every 6 months.   Exercise: activities at home, working around house, [employer]. Over 150 mins per week.   Advanced directives: has living will and HCPOA.   History Patient Active Problem List   Diagnosis Date Noted  . Leg edema 05/06/2014   . PVC (premature ventricular contraction) 10/15/2013  . Hypertension 10/08/2012  . Hypothyroid 10/08/2012   Past Medical History:  Diagnosis Date  . Allergy   . Arthritis    Phreesia 07/21/2020  . Heart murmur   . Hypertension   . Thyroid disease    Past Surgical History:  Procedure Laterality Date  . COLONOSCOPY    . HEMORRHOID SURGERY    . VASECTOMY     No Known Allergies Prior to Admission medications   Medication Sig Start Date End Date Taking? Authorizing Provider  aspirin 81 MG tablet Take 81 mg by mouth daily.   Yes [provider]  carvedilol (COREG) 6.25 MG tablet Take 1 tablet (6.25 mg total) by mouth 2 (two) times daily. 06/30/20  Yes Wellington Hampshire, MD  cholecalciferol (VITAMIN D) 1000 UNITS tablet Take 1,000 Units by mouth daily.   Yes [provider]  Cyanocobalamin (VITAMIN B12 PO) Take 1,000 mg by mouth 2 (two) times daily.    Yes [provider]  levothyroxine (SYNTHROID) 100 MCG tablet Take 1 tablet (100 mcg total) by mouth daily. 01/23/20  Yes Wendie Agreste, MD  loratadine (CLARITIN) 10 MG tablet Take 10 mg by mouth daily as needed.    Yes [provider]  losartan-hydrochlorothiazide (HYZAAR) 100-25 MG tablet Take 1 tablet by mouth daily. 06/30/20  Yes Wellington Hampshire, MD  Multiple Vitamin (MULTIVITAMIN) tablet Take 1 tablet by mouth daily.   Yes [provider]  Naproxen Sodium (ALEVE PO) Take by mouth as needed.   Yes [provider]  PREVIDENT 5000 SENSITIVE 1.1-5 % PSTE  04/10/19  Yes [provider]  vitamin E 1000 UNIT capsule Take 1,000 Units by mouth daily.   Yes [provider]   Social History   Socioeconomic History  . Marital status: Married    Spouse name: Not on file  . Number of children: Not on file  . Years of education: Not on file  . Highest education level: Not on file  Occupational History  . Not on file  Tobacco Use  . Smoking status: Never Smoker  .  Smokeless tobacco: Never Used  Substance and Sexual Activity  . Alcohol use: Yes    Comment: occasionally  . Drug use: No  . Sexual activity: Yes    Birth control/protection: None  Other Topics Concern  . Not on file  Social History Narrative  . Not on file   Social Determinants of Health   Financial Resource Strain:   . Difficulty of Paying Living Expenses: Not on file  Food Insecurity:   . Worried About Charity fundraiser in the Last Year: Not on file  . Ran Out of Food in the Last Year: Not on file  Transportation Needs:   . Lack of Transportation (Medical): Not on file  . Lack of Transportation (Non-Medical): Not on file  Physical  Activity:   . Days of Exercise per Week: Not on file  . Minutes of Exercise per Session: Not on file  Stress:   . Feeling of Stress : Not on file  Social Connections:   . Frequency of Communication with Friends and Family: Not on file  . Frequency of Social Gatherings with Friends and Family: Not on file  . Attends Religious Services: Not on file  . Active Member of Clubs or Organizations: Not on file  . Attends Archivist Meetings: Not on file  . Marital Status: Not on file  Intimate Partner Violence:   . Fear of Current or Ex-Partner: Not on file  . Emotionally Abused: Not on file  . Physically Abused: Not on file  . Sexually Abused: Not on file    Review of Systems 13 point review of systems per patient health survey noted.  Negative other than as indicated above or in HPI.    Objective:   Vitals:   07/23/20 0822 07/23/20 0824 07/23/20 0901  BP: (!) 160/84 (!) 150/81 (!) 145/82  Pulse: (!) 56    Temp: 98 F (36.7 C)    SpO2: 94%    Weight: 214 lb (97.1 kg)    Height: _0  (1.753 m)      Physical Exam Vitals reviewed.  Constitutional:      Appearance: He is well-developed.  HENT:     Head: Normocephalic and atraumatic.     Right Ear: External ear normal.     Left Ear: External ear normal.  Eyes:      Conjunctiva/sclera: Conjunctivae normal.     Pupils: Pupils are equal, round, and reactive to light.  Neck:     Thyroid: No thyromegaly.  Cardiovascular:     Rate and Rhythm: Normal rate and regular rhythm.     Heart sounds: Normal heart sounds.  Pulmonary:     Effort: Pulmonary effort is normal. No respiratory distress.     Breath sounds: Normal breath sounds. No wheezing.  Abdominal:     General: There is no distension.     Palpations: Abdomen is soft.     Tenderness: There is no abdominal tenderness.  Musculoskeletal:        General: No tenderness. Normal range of motion.     Cervical back: Normal range of motion and neck supple.  Lymphadenopathy:     Cervical: No cervical adenopathy.  Skin:    General: Skin is warm and dry.  Neurological:     Mental Status: He is alert and oriented to person, place, and time.     Deep Tendon Reflexes: Reflexes are normal and symmetric.  Psychiatric:        Behavior: Behavior normal.      Assessment & Plan:  JANSSEN ZEE is a 74 y.o. male . Medicare annual wellness visit, subsequent  - - anticipatory guidance as below in AVS, screening labs if needed. Health maintenance items as above in HPI discussed/recommended as applicable.  - no concerning responses on depression, fall, or functional status screening. Any positive responses noted as above. Advanced directives discussed as in CHL.   Hypothyroidism, unspecified type - Plan: levothyroxine (SYNTHROID) 100 MCG tablet, TSH  -adjust dosing to on an empty stomach - 30-32mn prior to meals, avoid combining with other meds/supplements.  6 weeks for lab only visit.  No new symptoms, continue same dose for now.  Encounter for immunization - Plan: Flu Vaccine QUAD High Dose(Fluad)  Benign essential HTN - Plan:  CMP14+EGFR  Elevated slightly in office but has been normal at home as well as other office visits.  Continue monitoring with RTC precautions.  Suspect component of whitecoat  hypertension  Meds ordered this encounter  Medications  . levothyroxine (SYNTHROID) 100 MCG tablet    Sig: Take 1 tablet (100 mcg total) by mouth daily.    Dispense:  90 tablet    Refill:  3   Patient Instructions   Thyroid med on empty stomach, avoid taking with iron or calcium supplements. Lab only visit in 6 weeks.  Blood pressure borderline elevated here but previous office visits have been okay as well as home readings.  Continue to monitor your blood pressure at home and if you do obtain readings over 140/90, let me know. No other medication changes at this time.  Thank you for coming in today.    Preventive Care 45 Years and Older, Male Preventive care refers to lifestyle choices and visits with your health care provider that can promote health and wellness. This includes:  A yearly physical exam. This is also called an annual well check.  Regular dental and eye exams.  Immunizations.  Screening for certain conditions.  Healthy lifestyle choices, such as diet and exercise. What can I expect for my preventive care visit? Physical exam Your health care provider will check:  Height and weight. These may be used to calculate body mass index (BMI), which is a measurement that tells if you are at a healthy weight.  Heart rate and blood pressure.  Your skin for abnormal spots. Counseling Your health care provider may ask you questions about:  Alcohol, tobacco, and drug use.  Emotional well-being.  Home and relationship well-being.  Sexual activity.  Eating habits.  History of falls.  Memory and ability to understand (cognition).  Work and work Statistician. What immunizations do I need?  Influenza (flu) vaccine  This is recommended every year. Tetanus, diphtheria, and pertussis (Tdap) vaccine  You may need a Td booster every 10 years. Varicella (chickenpox) vaccine  You may need this vaccine if you have not already been vaccinated. Zoster (shingles)  vaccine  You may need this after age 65. Pneumococcal conjugate (PCV13) vaccine  One dose is recommended after age 88. Pneumococcal polysaccharide (PPSV23) vaccine  One dose is recommended after age 10. Measles, mumps, and rubella (MMR) vaccine  You may need at least one dose of MMR if you were born in 1957 or later. You may also need a second dose. Meningococcal conjugate (MenACWY) vaccine  You may need this if you have certain conditions. Hepatitis A vaccine  You may need this if you have certain conditions or if you travel or work in places where you may be exposed to hepatitis A. Hepatitis B vaccine  You may need this if you have certain conditions or if you travel or work in places where you may be exposed to hepatitis B. Haemophilus influenzae type b (Hib) vaccine  You may need this if you have certain conditions. You may receive vaccines as individual doses or as more than one vaccine together in one shot (combination vaccines). Talk with your health care provider about the risks and benefits of combination vaccines. What tests do I need? Blood tests  Lipid and cholesterol levels. These may be checked every 5 years, or more frequently depending on your overall health.  Hepatitis C test.  Hepatitis B test. Screening  Lung cancer screening. You may have this screening every year starting at age  55 if you have a 30-pack-year history of smoking and currently smoke or have quit within the past 15 years.  Colorectal cancer screening. All adults should have this screening starting at age 44 and continuing until age 84. Your health care provider may recommend screening at age 30 if you are at increased risk. You will have tests every 1-10 years, depending on your results and the type of screening test.  Prostate cancer screening. Recommendations will vary depending on your family history and other risks.  Diabetes screening. This is done by checking your blood sugar (glucose)  after you have not eaten for a while (fasting). You may have this done every 1-3 years.  Abdominal aortic aneurysm (AAA) screening. You may need this if you are a current or former smoker.  Sexually transmitted disease (STD) testing. Follow these instructions at home: Eating and drinking  Eat a diet that includes fresh fruits and vegetables, whole grains, lean protein, and low-fat dairy products. Limit your intake of foods with high amounts of sugar, saturated fats, and salt.  Take vitamin and mineral supplements as recommended by your health care provider.  Do not drink alcohol if your health care provider tells you not to drink.  If you drink alcohol: ? Limit how much you have to 0-2 drinks a day. ? Be aware of how much alcohol is in your drink. In the [place] one drink equals one 12 oz bottle of beer (355 mL), one 5 oz glass of wine (148 mL), or one 1 oz glass of hard liquor (44 mL). Lifestyle  Take daily care of your teeth and gums.  Stay active. Exercise for at least 30 minutes on 5 or more days each week.  Do not use any products that contain nicotine or tobacco, such as cigarettes, e-cigarettes, and chewing tobacco. If you need help quitting, ask your health care provider.  If you are sexually active, practice safe sex. Use a condom or other form of protection to prevent STIs (sexually transmitted infections).  Talk with your health care provider about taking a low-dose aspirin or statin. What's next?  Visit your health care provider once a year for a well check visit.  Ask your health care provider how often you should have your eyes and teeth checked.  Stay up to date on all vaccines. This information is not intended to replace advice given to you by your health care provider. Make sure you discuss any questions you have with your health care provider. Document Revised: 10/25/2018 Document Reviewed: 10/25/2018 Elsevier Patient Education  [employer].      If  you have lab work done today you will be contacted with your lab results within the next 2 weeks.  If you have not heard from [place] then please contact us. The fastest way to get your results is to register for My Chart.   IF you received an x-ray today, you will receive an invoice from [facility]. Please contact [facility] at [phone] with questions or concerns regarding your invoice.   IF you received labwork today, you will receive an invoice from [facility]. Please contact [facility] at [phone] with questions or concerns regarding your invoice.   Our billing staff will not be able to assist you with questions regarding bills from these companies.  You will be contacted with the lab results as soon as they are available. The fastest way to get your results is to activate your My Chart account. Instructions are located on the  last page of this paperwork. If you have not heard from [place] regarding the results in 2 weeks, please contact this office.          Signed, [name], MD [facility]

## 2020-07-23 NOTE — Patient Instructions (Addendum)
Thyroid med on empty stomach, avoid taking with iron or calcium supplements. Lab only visit in 6 weeks.  Blood pressure borderline elevated here but previous office visits have been okay as well as home readings.  Continue to monitor your blood pressure at home and if you do obtain readings over 140/90, let me know. No other medication changes at this time.  Thank you for coming in today.    Preventive Care 74 Years and Older, Male Preventive care refers to lifestyle choices and visits with your health care provider that can promote health and wellness. This includes:  A yearly physical exam. This is also called an annual well check.  Regular dental and eye exams.  Immunizations.  Screening for certain conditions.  Healthy lifestyle choices, such as diet and exercise. What can I expect for my preventive care visit? Physical exam Your health care provider will check:  Height and weight. These may be used to calculate body mass index (BMI), which is a measurement that tells if you are at a healthy weight.  Heart rate and blood pressure.  Your skin for abnormal spots. Counseling Your health care provider may ask you questions about:  Alcohol, tobacco, and drug use.  Emotional well-being.  Home and relationship well-being.  Sexual activity.  Eating habits.  History of falls.  Memory and ability to understand (cognition).  Work and work Statistician. What immunizations do I need?  Influenza (flu) vaccine  This is recommended every year. Tetanus, diphtheria, and pertussis (Tdap) vaccine  You may need a Td booster every 10 years. Varicella (chickenpox) vaccine  You may need this vaccine if you have not already been vaccinated. Zoster (shingles) vaccine  You may need this after age 74. Pneumococcal conjugate (PCV13) vaccine  One dose is recommended after age 74. Pneumococcal polysaccharide (PPSV23) vaccine  One dose is recommended after age 17. Measles, mumps,  and rubella (MMR) vaccine  You may need at least one dose of MMR if you were born in 1957 or later. You may also need a second dose. Meningococcal conjugate (MenACWY) vaccine  You may need this if you have certain conditions. Hepatitis A vaccine  You may need this if you have certain conditions or if you travel or work in places where you may be exposed to hepatitis A. Hepatitis B vaccine  You may need this if you have certain conditions or if you travel or work in places where you may be exposed to hepatitis B. Haemophilus influenzae type b (Hib) vaccine  You may need this if you have certain conditions. You may receive vaccines as individual doses or as more than one vaccine together in one shot (combination vaccines). Talk with your health care provider about the risks and benefits of combination vaccines. What tests do I need? Blood tests  Lipid and cholesterol levels. These may be checked every 5 years, or more frequently depending on your overall health.  Hepatitis C test.  Hepatitis B test. Screening  Lung cancer screening. You may have this screening every year starting at age 74 if you have a 30-pack-year history of smoking and currently smoke or have quit within the past 15 years.  Colorectal cancer screening. All adults should have this screening starting at age 24 and continuing until age 54. Your health care provider may recommend screening at age 1 if you are at increased risk. You will have tests every 1-10 years, depending on your results and the type of screening test.  Prostate cancer screening. Recommendations  will vary depending on your family history and other risks.  Diabetes screening. This is done by checking your blood sugar (glucose) after you have not eaten for a while (fasting). You may have this done every 1-3 years.  Abdominal aortic aneurysm (AAA) screening. You may need this if you are a current or former smoker.  Sexually transmitted disease (STD)  testing. Follow these instructions at home: Eating and drinking  Eat a diet that includes fresh fruits and vegetables, whole grains, lean protein, and low-fat dairy products. Limit your intake of foods with high amounts of sugar, saturated fats, and salt.  Take vitamin and mineral supplements as recommended by your health care provider.  Do not drink alcohol if your health care provider tells you not to drink.  If you drink alcohol: ? Limit how much you have to 0-2 drinks a day. ? Be aware of how much alcohol is in your drink. In the [place] one drink equals one 12 oz bottle of beer (355 mL), one 5 oz glass of wine (148 mL), or one 1 oz glass of hard liquor (44 mL). Lifestyle  Take daily care of your teeth and gums.  Stay active. Exercise for at least 30 minutes on 5 or more days each week.  Do not use any products that contain nicotine or tobacco, such as cigarettes, e-cigarettes, and chewing tobacco. If you need help quitting, ask your health care provider.  If you are sexually active, practice safe sex. Use a condom or other form of protection to prevent STIs (sexually transmitted infections).  Talk with your health care provider about taking a low-dose aspirin or statin. What's next?  Visit your health care provider once a year for a well check visit.  Ask your health care provider how often you should have your eyes and teeth checked.  Stay up to date on all vaccines. This information is not intended to replace advice given to you by your health care provider. Make sure you discuss any questions you have with your health care provider. Document Revised: 10/25/2018 Document Reviewed: 10/25/2018 Elsevier Patient Education  [employer].      If you have lab work done today you will be contacted with your lab results within the next 2 weeks.  If you have not heard from [place] then please contact us. The fastest way to get your results is to register for My Chart.   IF you  received an x-ray today, you will receive an invoice from [facility]. Please contact [facility] at [phone] with questions or concerns regarding your invoice.   IF you received labwork today, you will receive an invoice from [facility]. Please contact [facility] at [phone] with questions or concerns regarding your invoice.   Our billing staff will not be able to assist you with questions regarding bills from these companies.  You will be contacted with the lab results as soon as they are available. The fastest way to get your results is to activate your My Chart account. Instructions are located on the last page of this paperwork. If you have not heard from [place] regarding the results in 2 weeks, please contact this office.

## 2020-09-03 ENCOUNTER — Other Ambulatory Visit: Payer: Self-pay

## 2020-09-03 ENCOUNTER — Ambulatory Visit (INDEPENDENT_AMBULATORY_CARE_PROVIDER_SITE_OTHER): Payer: Medicare HMO | Admitting: Family Medicine

## 2020-09-03 DIAGNOSIS — E039 Hypothyroidism, unspecified: Secondary | ICD-10-CM | POA: Diagnosis not present

## 2020-09-03 DIAGNOSIS — I10 Essential (primary) hypertension: Secondary | ICD-10-CM | POA: Diagnosis not present

## 2020-09-03 DIAGNOSIS — Z125 Encounter for screening for malignant neoplasm of prostate: Secondary | ICD-10-CM

## 2020-09-04 LAB — CMP14+EGFR
ALT: 12 IU/L (ref 0–44)
AST: 17 IU/L (ref 0–40)
Albumin/Globulin Ratio: 1.6 (ref 1.2–2.2)
Albumin: 4.4 g/dL (ref 3.7–4.7)
Alkaline Phosphatase: 47 IU/L (ref 44–121)
BUN/Creatinine Ratio: 13 (ref 10–24)
BUN: 13 mg/dL (ref 8–27)
Bilirubin Total: 0.6 mg/dL (ref 0.0–1.2)
CO2: 26 mmol/L (ref 20–29)
Calcium: 9.7 mg/dL (ref 8.6–10.2)
Chloride: 100 mmol/L (ref 96–106)
Creatinine, Ser: 1 mg/dL (ref 0.76–1.27)
GFR calc Af Amer: 85 mL/min/{1.73_m2} (ref >59)
GFR calc non Af Amer: 74 mL/min/{1.73_m2} (ref >59)
Globulin, Total: 2.7 g/dL (ref 1.5–4.5)
Glucose: 106 mg/dL — ABNORMAL HIGH (ref 65–99)
Potassium: 3.8 mmol/L (ref 3.5–5.2)
Sodium: 142 mmol/L (ref 134–144)
Total Protein: 7.1 g/dL (ref 6.0–8.5)

## 2020-09-04 LAB — PSA: Prostate Specific Ag, Serum: 0.7 ng/mL (ref 0.0–4.0)

## 2020-09-04 LAB — TSH: TSH: 2.35 u[IU]/mL (ref 0.450–4.500)

## 2020-09-29 ENCOUNTER — Encounter: Payer: Self-pay | Admitting: Family Medicine

## 2021-02-23 DIAGNOSIS — H5203 Hypermetropia, bilateral: Secondary | ICD-10-CM | POA: Diagnosis not present

## 2021-03-26 ENCOUNTER — Encounter: Payer: Self-pay | Admitting: Family Medicine

## 2021-04-27 ENCOUNTER — Encounter: Payer: Self-pay | Admitting: Family Medicine

## 2021-06-29 NOTE — Progress Notes (Signed)
Cardiology Office Note:    Date:  06/30/2021   ID:  Terry Vega, DOB 1946/03/18, MRN [id number]  PCP:  Shade Flood, MD  [facility] Cardiologist:  Dr. [facility] Electrophysiologist:  None   Referring MD: Shade Flood, MD   Chief Complaint: 1 year follow-up  History of Present Illness:    Terry Vega is a 75 y.o. male with a hx of PVCs, lower extremity swelling, HTN, hypothyroidism who presents for annual follow-up.   He was seen in 2014 for PVCs. Echo showed normal LV function, mildly dilated LA, mild aoritc regurgitation. Nuclear stress test showed no evidence of ischemia. Lower leg edema improved with switching from diltiazem to coreg.   Last seen 06/30/20 and was doing well from a cardiac persepctive.   Today, the patient has no concerns. Denies any significant medical changes since the last visit. The patient stays very active with yard work, church, and grand kids. No chest pain or shortness of breath with exertion. Diet is cooking at home a lot, pretty well balanced, could be better. Patient has cut back on salt. BP ok today. BP at home 120-130/80 after medications. He will get labs with PCP in September 2022. Has intermittent dependent edema. No orthopnea, pnd, palpitations, lightheadedness or dizziness. Has 1+ lower leg edema, L>R. EKG shows SB, 55bpm with no changes.   Past Medical History:  Diagnosis Date   Allergy    Arthritis    [employer]ia 07/21/2020   Heart murmur    Hypertension    Thyroid disease     Past Surgical History:  Procedure Laterality Date   COLONOSCOPY     HEMORRHOID SURGERY     VASECTOMY      Current Medications: Current Meds  Medication Sig   aspirin 81 MG tablet Take 81 mg by mouth daily.   cholecalciferol (VITAMIN D) 1000 UNITS tablet Take 1,000 Units by mouth daily.   Cyanocobalamin (VITAMIN B12 PO) Take 1,000 mg by mouth 2 (two) times daily.    levothyroxine (SYNTHROID) 100 MCG tablet Take 1 tablet (100 mcg  total) by mouth daily.   loratadine (CLARITIN) 10 MG tablet Take 10 mg by mouth daily as needed.    Multiple Vitamin (MULTIVITAMIN) tablet Take 1 tablet by mouth daily.   Naproxen Sodium (ALEVE PO) Take by mouth as needed.   PREVIDENT 5000 SENSITIVE 1.1-5 % PSTE    vitamin E 1000 UNIT capsule Take 1,000 Units by mouth daily.   [DISCONTINUED] carvedilol (COREG) 6.25 MG tablet Take 1 tablet (6.25 mg total) by mouth 2 (two) times daily.   [DISCONTINUED] losartan-hydrochlorothiazide (HYZAAR) 100-25 MG tablet Take 1 tablet by mouth daily.     Allergies:   Patient has no known allergies.   Social History   Socioeconomic History   Marital status: Married    Spouse name: Not on file   Number of children: Not on file   Years of education: Not on file   Highest education level: Not on file  Occupational History   Not on file  Tobacco Use   Smoking status: Never   Smokeless tobacco: Never  Substance and Sexual Activity   Alcohol use: Yes    Comment: occasionally   Drug use: No   Sexual activity: Yes    Birth control/protection: None  Other Topics Concern   Not on file  Social History Narrative   Not on file   Social Determinants of Health   Financial Resource Strain: Not on file  Food Insecurity: Not on file  Transportation Needs: Not on file  Physical Activity: Not on file  Stress: Not on file  Social Connections: Not on file     Family History: The patient's family history includes Cancer in his father; Diabetes in his father and maternal grandmother; Heart disease in his mother; Stroke in his maternal grandmother. There is no history of Colon cancer, Esophageal cancer, Rectal cancer, or Stomach cancer.  ROS:   Please see the history of present illness.     All other systems reviewed and are negative.  EKGs/Labs/Other Studies Reviewed:    The following studies were reviewed today:  Echo 2014 Study Conclusions   - Left ventricle: The cavity size was normal. There was  mild    concentric hypertrophy. Systolic function was normal. The    estimated ejection fraction was in the range of 60% to    65%. Wall motion was normal; there were no regional wall    motion abnormalities. Left ventricular diastolic function    parameters were normal.  - Aortic valve: Mild regurgitation.  - Left atrium: The atrium was mildly dilated.  Transthoracic echocardiography.  M-mode, complete 2D,  spectral Doppler, and color Doppler.  Height:  Height:  172.7cm. Height: 68in.  Weight:  Weight: 95.3kg. Weight:  209.6lb.  Body mass index:  BMI: 31.9kg/m^2.  Body surface  area:    BSA: 2.47m^2.  Blood pressure:     162/80.  Patient  status:  Outpatient.   EKG:  EKG is  ordered today.  The ekg ordered today demonstrates SB, 55bpm, LAD, no changes  Recent Labs: 09/03/2020: ALT 12; BUN 13; Creatinine, Ser 1.00; Potassium 3.8; Sodium 142; TSH 2.350  Recent Lipid Panel    Component Value Date/Time   CHOL 162 01/23/2020 1342   TRIG 102 01/23/2020 1342   HDL 41 01/23/2020 1342   CHOLHDL 4.0 01/23/2020 1342   CHOLHDL 4.2 03/17/2016 0814   VLDL 19 03/17/2016 0814   LDLCALC 102 (H) 01/23/2020 1342     Physical Exam:    VS:  BP 138/80 (BP Location: Left Arm, Patient Position: Sitting, Cuff Size: Normal)   Pulse (!) 55   Ht 5\' 9"  (1.753 m)   Wt 211 lb 2 oz (95.8 kg)   SpO2 96%   BMI 31.18 kg/m     Wt Readings from Last 3 Encounters:  06/30/21 211 lb 2 oz (95.8 kg)  07/23/20 214 lb (97.1 kg)  06/30/20 216 lb 9.6 oz (98.2 kg)     GEN:  Well nourished, well developed in no acute distress HEENT: Normal NECK: No JVD; No carotid bruits LYMPHATICS: No lymphadenopathy CARDIAC: bradycardia, RR, no murmurs, rubs, gallops RESPIRATORY:  Clear to auscultation without rales, wheezing or rhonchi  ABDOMEN: Soft, non-tender, non-distended MUSCULOSKELETAL:  No edema; No deformity  SKIN: Warm and dry NEUROLOGIC:  Alert and oriented x 3 PSYCHIATRIC:  Normal affect   ASSESSMENT:     1. Essential hypertension   2. Murmur   3. Leg edema   4. PVC (premature ventricular contraction)   5. Hyperlipidemia, mixed    PLAN:    In order of problems listed above:  PVCs No PVCs noted on EKG. He denies palpitations. Continue Coreg  HTN BP at home 120-130/70s. Today BP reasonable. Lifestyle changes discussed. Continue Coreg and Hyzarr  HLD LDL 102 11/2019. PCP will check labs next month. Again, lifestyle changes discussed with patient. Not on cholesterol medication at baseline.   Lower leg edema He has  1+ lower leg edema, L>R. He reports it is relatively unchanged. No shortness of breath. He reports low salt diet. Echo from 2014 with normal EF, normal diastolic parameters, and mild AI. I will re-check echo. Discussed no salt diet, leg elevation and compression socks. He is on HCTZ 25mg  daily.   Disposition: Follow up in 1 year(s) with MD/APP    Signed, Quantrell Splitt , PA-C  06/30/2021 4:06 PM    [facility]

## 2021-06-30 ENCOUNTER — Other Ambulatory Visit: Payer: Self-pay

## 2021-06-30 ENCOUNTER — Ambulatory Visit: Payer: Medicare HMO | Admitting: Medical

## 2021-06-30 ENCOUNTER — Encounter: Payer: Self-pay | Admitting: Medical

## 2021-06-30 VITALS — BP 138/80 | HR 55 | Ht 69.0 in | Wt 211.1 lb

## 2021-06-30 DIAGNOSIS — R600 Localized edema: Secondary | ICD-10-CM | POA: Diagnosis not present

## 2021-06-30 DIAGNOSIS — R011 Cardiac murmur, unspecified: Secondary | ICD-10-CM | POA: Diagnosis not present

## 2021-06-30 DIAGNOSIS — I493 Ventricular premature depolarization: Secondary | ICD-10-CM | POA: Diagnosis not present

## 2021-06-30 DIAGNOSIS — I10 Essential (primary) hypertension: Secondary | ICD-10-CM

## 2021-06-30 DIAGNOSIS — E782 Mixed hyperlipidemia: Secondary | ICD-10-CM

## 2021-06-30 MED ORDER — CARVEDILOL 6.25 MG PO TABS
6.2500 mg | ORAL_TABLET | Freq: Two times a day (BID) | ORAL | 3 refills | Status: DC
Start: 2021-06-30 — End: 2022-05-09

## 2021-06-30 MED ORDER — LOSARTAN POTASSIUM-HCTZ 100-25 MG PO TABS
1.0000 | ORAL_TABLET | Freq: Every day | ORAL | 3 refills | Status: DC
Start: 2021-06-30 — End: 2022-05-09

## 2021-06-30 NOTE — Patient Instructions (Signed)
Medication Instructions:  No changes at this time.   *If you need a refill on your cardiac medications before your next appointment, please call your pharmacy*   Lab Work: None  If you have labs (blood work) drawn today and your tests are completely normal, you will receive your results only by: MyChart Message (if you have MyChart) OR A paper copy in the mail If you have any lab test that is abnormal or we need to change your treatment, we will call you to review the results.   Testing/Procedures: Your physician has requested that you have an echocardiogram.   Echocardiography is a painless test that uses sound waves to create images of your heart. It provides your doctor with information about the size and shape of your heart and how well your heart's chambers and valves are working. This procedure takes approximately one hour. There are no restrictions for this procedure.    Follow-Up: At [facility], you and your health needs are our priority.  As part of our continuing mission to provide you with exceptional heart care, we have created designated Provider Care Teams.  These Care Teams include your primary Cardiologist (physician) and Advanced Practice Providers (APPs -  Physician Assistants and Nurse Practitioners) who all work together to provide you with the care you need, when you need it.   Your next appointment:   1 year(s)  The format for your next appointment:   In Person  Provider:   You may see Dr. Lorine Bears or one of the following Advanced Practice Providers on your designated Care Team:   Nicolasa Ducking, NP Eula Listen, PA-C Marisue Ivan, PA-C [place], [place]

## 2021-07-27 ENCOUNTER — Ambulatory Visit (INDEPENDENT_AMBULATORY_CARE_PROVIDER_SITE_OTHER): Payer: Medicare HMO

## 2021-07-27 ENCOUNTER — Other Ambulatory Visit: Payer: Self-pay

## 2021-07-27 DIAGNOSIS — R011 Cardiac murmur, unspecified: Secondary | ICD-10-CM

## 2021-07-27 DIAGNOSIS — R600 Localized edema: Secondary | ICD-10-CM

## 2021-07-27 MED ORDER — PERFLUTREN LIPID MICROSPHERE
1.0000 mL | INTRAVENOUS | Status: AC | PRN
  Administered 2021-07-27: 2 mL via INTRAVENOUS

## 2021-07-28 ENCOUNTER — Ambulatory Visit (INDEPENDENT_AMBULATORY_CARE_PROVIDER_SITE_OTHER): Payer: Medicare HMO | Admitting: Family Medicine

## 2021-07-28 ENCOUNTER — Encounter: Payer: Self-pay | Admitting: Family Medicine

## 2021-07-28 VITALS — BP 140/82 | HR 56 | Temp 98.1°F | Resp 17 | Ht 69.0 in | Wt 209.4 lb

## 2021-07-28 DIAGNOSIS — I10 Essential (primary) hypertension: Secondary | ICD-10-CM

## 2021-07-28 DIAGNOSIS — Z23 Encounter for immunization: Secondary | ICD-10-CM

## 2021-07-28 DIAGNOSIS — Z131 Encounter for screening for diabetes mellitus: Secondary | ICD-10-CM | POA: Diagnosis not present

## 2021-07-28 DIAGNOSIS — E039 Hypothyroidism, unspecified: Secondary | ICD-10-CM

## 2021-07-28 DIAGNOSIS — E785 Hyperlipidemia, unspecified: Secondary | ICD-10-CM

## 2021-07-28 DIAGNOSIS — Z0000 Encounter for general adult medical examination without abnormal findings: Secondary | ICD-10-CM

## 2021-07-28 LAB — TSH: TSH: 2.65 u[IU]/mL (ref 0.35–5.50)

## 2021-07-28 LAB — LIPID PANEL
Cholesterol: 175 mg/dL (ref 0–200)
HDL: 44.5 mg/dL (ref >39.00)
LDL Cholesterol: 112 mg/dL — ABNORMAL HIGH (ref 0–99)
NonHDL: 130.01
Total CHOL/HDL Ratio: 4
Triglycerides: 90 mg/dL (ref 0.0–149.0)
VLDL: 18 mg/dL (ref 0.0–40.0)

## 2021-07-28 LAB — COMPREHENSIVE METABOLIC PANEL
ALT: 13 U/L (ref 0–53)
AST: 16 U/L (ref 0–37)
Albumin: 4.1 g/dL (ref 3.5–5.2)
Alkaline Phosphatase: 39 U/L (ref 39–117)
BUN: 15 mg/dL (ref 6–23)
CO2: 29 mEq/L (ref 19–32)
Calcium: 9.7 mg/dL (ref 8.4–10.5)
Chloride: 101 mEq/L (ref 96–112)
Creatinine, Ser: 0.86 mg/dL (ref 0.40–1.50)
GFR: 84.62 mL/min (ref >60.00)
Glucose, Bld: 98 mg/dL (ref 70–99)
Potassium: 3.7 mEq/L (ref 3.5–5.1)
Sodium: 140 mEq/L (ref 135–145)
Total Bilirubin: 0.8 mg/dL (ref 0.2–1.2)
Total Protein: 7.1 g/dL (ref 6.0–8.3)

## 2021-07-28 LAB — ECHOCARDIOGRAM COMPLETE
AR max vel: 1.87 cm2
AV Area VTI: 1.92 cm2
AV Area mean vel: 1.94 cm2
AV Mean grad: 7 mmHg
AV Peak grad: 13.4 mmHg
Ao pk vel: 1.83 m/s
Area-P 1/2: 2.42 cm2
Calc EF: 73.1 %
P 1/2 time: 1083 msec
S' Lateral: 3.1 cm
Single Plane A2C EF: 77.3 %
Single Plane A4C EF: 69.2 %

## 2021-07-28 LAB — HEMOGLOBIN A1C: Hgb A1c MFr Bld: 5.3 % (ref 4.6–6.5)

## 2021-07-28 MED ORDER — LEVOTHYROXINE SODIUM 100 MCG PO TABS: 100.0000 ug | ORAL_TABLET | Freq: Every day | ORAL | 3 refills | Status: DC

## 2021-07-28 NOTE — Progress Notes (Signed)
Subjective:  Patient ID: Terry Vega, male    DOB: [birth year]  Age: 75 y.o. MRN: [id number]  CC:  Chief Complaint  Patient presents with   Medicare Wellness    Pt here for annual exam, no concerns states nothing to discuss today    Immunizations    Pt would like his high dose flu shot today     HPI Terry Vega presents for  Annual wellness exam: Care team: Cardiology, Dr. Kirke Corin Ophthalmology, Dr. Hyacinth Meeker PCP, Neva Seat  Hypertension: With history of PVCs, lower extremity edema - improved after change from diltiazem, followed by cardiology.  Treated with carvedilol with improved PVCs.  Also on losartan/ HCTZ. Cardiology appt 8/17 noted. Low salt diet for edema, compression socks, leg elevation. 1 year follow up planned.  Has been wearing compression socks - working well.  Home readings: 130/80 range. Watching diet - avoiding soft drinks and sweets.  Wt Readings from Last 3 Encounters:  07/28/21 209 lb 6.4 oz (95 kg)  06/30/21 211 lb 2 oz (95.8 kg)  07/23/20 214 lb (97.1 kg)   Lab Results  Component Value Date   CHOL 162 01/23/2020   HDL 41 01/23/2020   LDLCALC 102 (H) 01/23/2020   TRIG 102 01/23/2020   CHOLHDL 4.0 01/23/2020     BP Readings from Last 3 Encounters:  07/28/21 140/82  06/30/21 138/80  07/23/20 (!) 145/82   Lab Results  Component Value Date   CREATININE 1.00 09/03/2020   Hypothyroidism: Lab Results  Component Value Date   TSH 2.350 09/03/2020  Taking medication daily.  Synthroid 100 mcg daily.  No new hot or cold intolerance. No new hair or skin changes, heart palpitations or new fatigue. No new weight changes.   Fall Risk  07/28/2021 07/23/2020 01/23/2020 05/10/2019 04/30/2019  Falls in the past year? 0 0 0 0 0  Number falls in past yr: 0 - - 0 -  Injury with Fall? 0 0 - 0 0  Risk for fall due to : No Fall Risks - - - -  Follow up Falls evaluation completed Falls evaluation completed Falls evaluation completed Falls evaluation completed Falls  evaluation completed;Education provided;Falls prevention discussed  Adequate home lighting.  Stairs 1 flight with handrail, rare ladder use - under 6 ft.  No grab bars in the bathroom.  Few loose rugs.  1 cat at home - stays away from legs.    Depression screen [facility] 2/9 07/28/2021 07/23/2020 01/23/2020 05/10/2019 04/30/2019  Decreased Interest 0 0 0 0 0  Down, Depressed, Hopeless 0 0 0 0 0  PHQ - 2 Score 0 0 0 0 0  Altered sleeping 0 - - - -  Tired, decreased energy 0 - - - -  Change in appetite 0 - - - -  Feeling bad or failure about yourself  0 - - - -  Trouble concentrating 0 - - - -  Moving slowly or fidgety/restless 0 - - - -  Suicidal thoughts 0 - - - -  PHQ-9 Score 0 - - - -   Immunization History  Administered Date(s) Administered   Fluad Quad(high Dose 65+) 07/23/2020   Influenza Split 08/13/2014   Influenza, High Dose Seasonal PF 08/29/2017, 08/23/2018, 08/14/2019   Influenza,inj,Quad PF,6+ Mos 10/09/2013   Influenza-Unspecified 08/24/2016, 08/29/2018   Moderna Sars-Covid-2 Vaccination 12/19/2019, 01/14/2020, 09/29/2020   Pneumococcal Conjugate-13 09/11/2014   Pneumococcal Polysaccharide-23 04/20/2017   Td 12/23/2008, 05/10/2019   Zoster, Live 04/03/2016, 07/13/2016  High dose influenza  vaccine today.  Covid booster in February, bivalent booster recommended.   Functional Status Survey: Is the patient deaf or have difficulty hearing?: Yes Does the patient have difficulty seeing, even when wearing glasses/contacts?: No Does the patient have difficulty concentrating, remembering, or making decisions?: No Does the patient have difficulty walking or climbing stairs?: No Does the patient have difficulty dressing or bathing?: No Does the patient have difficulty doing errands alone such as visiting a doctor's office or shopping?: No Some longstanding hearing loss in crowds, has hearing aids to use in these situations.   6CIT Screen 07/28/2021 07/23/2020 04/30/2019 04/23/2018  What  Year? 0 points 0 points 0 points 0 points  What month? 0 points 0 points 0 points 0 points  What time? 0 points 0 points 0 points 0 points  Count back from 20 0 points 0 points 0 points 0 points  Months in reverse 0 points 0 points 0 points 0 points  Repeat phrase 0 points 0 points 0 points 0 points  Total Score 0 0 0 0    Vision Screening   Right eye Left eye Both eyes  Without correction     With correction 20/20 20/25 20/25-1  Optho eval 4 months ago.   Rare alcohol.  Flowsheet Row Office Visit from 07/28/2021 in [facility]  AUDIT-C Score 0      Dental: every 6 months.   Exercise: work at home, remodeled back deck. [name] and work at [employer]. Over 150 minutes per week.   Tobacco: none.   Advance directives: He has a living will and healthcare power of attorney. No changes requested.    History Patient Active Problem List   Diagnosis Date Noted   Leg edema 05/06/2014   PVC (premature ventricular contraction) 10/15/2013   Hypertension 10/08/2012   Hypothyroid 10/08/2012   Past Medical History:  Diagnosis Date   Allergy    Arthritis    [employer]reesia 07/21/2020   Heart murmur    Hypertension    Thyroid disease    Past Surgical History:  Procedure Laterality Date   COLONOSCOPY     HEMORRHOID SURGERY     VASECTOMY     No Known Allergies Prior to Admission medications   Medication Sig Start Date End Date Taking? Authorizing Provider  aspirin 81 MG tablet Take 81 mg by mouth daily.    [provider]  carvedilol (COREG) 6.25 MG tablet Take 1 tablet (6.25 mg total) by mouth 2 (two) times daily. 06/30/21   Furth, Cadence H, PA-C  cholecalciferol (VITAMIN D) 1000 UNITS tablet Take 1,000 Units by mouth daily.    [provider]  Cyanocobalamin (VITAMIN B12 PO) Take 1,000 mg by mouth 2 (two) times daily.     [provider]  levothyroxine (SYNTHROID) 100 MCG tablet Take 1 tablet (100 mcg total) by mouth  daily. 07/23/20   Shade Flood, MD  loratadine (CLARITIN) 10 MG tablet Take 10 mg by mouth daily as needed.     [provider]  losartan-hydrochlorothiazide (HYZAAR) 100-25 MG tablet Take 1 tablet by mouth daily. 06/30/21   Furth, Cadence H, PA-C  Multiple Vitamin (MULTIVITAMIN) tablet Take 1 tablet by mouth daily.    [provider]  Naproxen Sodium (ALEVE PO) Take by mouth as needed.    [provider]  PREVIDENT 5000 SENSITIVE 1.1-5 % PSTE  04/10/19   [provider]  vitamin E 1000 UNIT capsule Take 1,000 Units by mouth daily.  [provider]   Social History   Socioeconomic History   Marital status: Married    Spouse name: Not on file   Number of children: Not on file   Years of education: Not on file   Highest education level: Not on file  Occupational History   Not on file  Tobacco Use   Smoking status: Never   Smokeless tobacco: Never  Vaping Use   Vaping Use: Never used  Substance and Sexual Activity   Alcohol use: Yes    Comment: occasionally   Drug use: No   Sexual activity: Yes    Birth control/protection: None  Other Topics Concern   Not on file  Social History Narrative   Not on file   Social Determinants of Health   Financial Resource Strain: Not on file  Food Insecurity: Not on file  Transportation Needs: Not on file  Physical Activity: Not on file  Stress: Not on file  Social Connections: Not on file  Intimate Partner Violence: Not on file    Review of Systems  Per hpi Objective:   Vitals:   07/28/21 0819  BP: 140/82  Pulse: (!) 56  Resp: 17  Temp: 98.1 F (36.7 C)  TempSrc: Temporal  SpO2: 95%  Weight: 209 lb 6.4 oz (95 kg)  Height: 5\' 9"  (1.753 m)     Physical Exam Vitals reviewed.  Constitutional:      Appearance: He is well-developed.  HENT:     Head: Normocephalic and atraumatic.  Neck:     Vascular: No carotid bruit or JVD.     Comments:  No  thyromegaly/nodule. Cardiovascular:     Rate and Rhythm: Normal rate and regular rhythm.     Heart sounds: Normal heart sounds. No murmur heard. Pulmonary:     Effort: Pulmonary effort is normal.     Breath sounds: Normal breath sounds. No rales.  Musculoskeletal:     Right lower leg: No edema.     Left lower leg: Edema (1+ lower left.) present.  Skin:    General: Skin is warm and dry.  Neurological:     Mental Status: He is alert and oriented to person, place, and time.  Psychiatric:        Mood and Affect: Mood normal.       Assessment & Plan:  Terry Vega is a 76 y.o. male . Medicare annual wellness visit, subsequent - Plan: Comprehensive metabolic panel, Lipid panel, Hemoglobin A1c, TSH  - - anticipatory guidance as below in AVS, screening labs if needed. Health maintenance items as above in HPI discussed/recommended as applicable.  - no concerning responses on depression, fall, or functional status screening. Any positive responses noted as above. Advanced directives discussed as in CH[facility].   Hypothyroidism, unspecified type - Plan: levothyroxine (SYNTHROID) 100 MCG tablet, TSH  -  Stable, tolerating current regimen. Medications refilled. Labs pending as above.   Benign essential HTN  - stable, check labs, continue same regimen.   Hyperlipidemia, unspecified hyperlipidemia type - Plan: Comprehensive metabolic panel, Lipid panel  - check labs, no current statin. Borderline LDL prior.   Need for influenza vaccination - Plan: Flu Vaccine QUAD High Dose(Fluad)  Screening for diabetes mellitus - Plan: Hemoglobin A1c   Meds ordered this encounter  Medications   levothyroxine (SYNTHROID) 100 MCG tablet    Sig: Take 1 tablet (100 mcg total) by mouth daily.    Dispense:  90 tablet    Refill:  3  Patient Instructions  Keep a record of your blood pressures outside of the office and if those are running in the 140's or over 90 let me know.  No med changes at this time.  Thanks for coming in today.   Preventive Care 49 Years and Older, Male Preventive care refers to lifestyle choices and visits with your health care provider that can promote health and wellness. This includes: A yearly physical exam. This is also called an annual wellness visit. Regular dental and eye exams. Immunizations. Screening for certain conditions. Healthy lifestyle choices, such as: Eating a healthy diet. Getting regular exercise. Not using drugs or products that contain nicotine and tobacco. Limiting alcohol use. What can I expect for my preventive care visit? Physical exam Your health care provider will check your: Height and weight. These may be used to calculate your BMI (body mass index). BMI is a measurement that tells if you are at a healthy weight. Heart rate and blood pressure. Body temperature. Skin for abnormal spots. Counseling Your health care provider may ask you questions about your: Past medical problems. Family's medical history. Alcohol, tobacco, and drug use. Emotional well-being. Home life and relationship well-being. Sexual activity. Diet, exercise, and sleep habits. History of falls. Memory and ability to understand (cognition). Work and work Astronomer. Access to firearms. What immunizations do I need? Vaccines are usually given at various ages, according to a schedule. Your health care provider will recommend vaccines for you based on your age, medical history, and lifestyle or other factors, such as travel or where you work. What tests do I need? Blood tests Lipid and cholesterol levels. These may be checked every 5 years, or more often depending on your overall health. Hepatitis C test. Hepatitis B test. Screening Lung cancer screening. You may have this screening every year starting at age 75 if you have a 30-pack-year history of smoking and currently smoke or have quit within the past 15 years. Colorectal cancer screening. All adults  should have this screening starting at age 66 and continuing until age 61. Your health care provider may recommend screening at age 13 if you are at increased risk. You will have tests every 1-10 years, depending on your results and the type of screening test. Prostate cancer screening. Recommendations will vary depending on your family history and other risks. Genital exam to check for testicular cancer or hernias. Diabetes screening. This is done by checking your blood sugar (glucose) after you have not eaten for a while (fasting). You may have this done every 1-3 years. Abdominal aortic aneurysm (AAA) screening. You may need this if you are a current or former smoker. STD (sexually transmitted disease) testing, if you are at risk. Follow these instructions at home: Eating and drinking  Eat a diet that includes fresh fruits and vegetables, whole grains, lean protein, and low-fat dairy products. Limit your intake of foods with high amounts of sugar, saturated fats, and salt. Take vitamin and mineral supplements as recommended by your health care provider. Do not drink alcohol if your health care provider tells you not to drink. If you drink alcohol: Limit how much you have to 0-2 drinks a day. Be aware of how much alcohol is in your drink. In the [place] one drink equals one 12 oz bottle of beer (355 mL), one 5 oz glass of wine (148 mL), or one 1 oz glass of hard liquor (44 mL). Lifestyle Take daily care of your teeth and gums. Brush your teeth every  morning and night with fluoride toothpaste. Floss one time each day. Stay active. Exercise for at least 30 minutes 5 or more days each week. Do not use any products that contain nicotine or tobacco, such as cigarettes, e-cigarettes, and chewing tobacco. If you need help quitting, ask your health care provider. Do not use drugs. If you are sexually active, practice safe sex. Use a condom or other form of protection to prevent STIs (sexually  transmitted infections). Talk with your health care provider about taking a low-dose aspirin or statin. Find healthy ways to cope with stress, such as: Meditation, yoga, or listening to music. Journaling. Talking to a trusted person. Spending time with friends and family. Safety Always wear your seat belt while driving or riding in a vehicle. Do not drive: If you have been drinking alcohol. Do not ride with someone who has been drinking. When you are tired or distracted. While texting. Wear a helmet and other protective equipment during sports activities. If you have firearms in your house, make sure you follow all gun safety procedures. What's next? Visit your health care provider once a year for an annual wellness visit. Ask your health care provider how often you should have your eyes and teeth checked. Stay up to date on all vaccines. This information is not intended to replace advice given to you by your health care provider. Make sure you discuss any questions you have with your health care provider. Document Revised: 01/08/2021 Document Reviewed: 10/25/2018 Elsevier Patient Education  2022 Elsevier Inc.    Signed,   Meredith Staggers, MD [facility], [facility] 07/28/21 8:58 AM

## 2021-07-28 NOTE — Patient Instructions (Addendum)
Keep a record of your blood pressures outside of the office and if those are running in the 140's or over 90 let me know.  No med changes at this time. Thanks for coming in today.   Preventive Care 75 Years and Older, Male Preventive care refers to lifestyle choices and visits with your health care provider that can promote health and wellness. This includes: A yearly physical exam. This is also called an annual wellness visit. Regular dental and eye exams. Immunizations. Screening for certain conditions. Healthy lifestyle choices, such as: Eating a healthy diet. Getting regular exercise. Not using drugs or products that contain nicotine and tobacco. Limiting alcohol use. What can I expect for my preventive care visit? Physical exam Your health care provider will check your: Height and weight. These may be used to calculate your BMI (body mass index). BMI is a measurement that tells if you are at a healthy weight. Heart rate and blood pressure. Body temperature. Skin for abnormal spots. Counseling Your health care provider may ask you questions about your: Past medical problems. Family's medical history. Alcohol, tobacco, and drug use. Emotional well-being. Home life and relationship well-being. Sexual activity. Diet, exercise, and sleep habits. History of falls. Memory and ability to understand (cognition). Work and work Astronomer. Access to firearms. What immunizations do I need? Vaccines are usually given at various ages, according to a schedule. Your health care provider will recommend vaccines for you based on your age, medical history, and lifestyle or other factors, such as travel or where you work. What tests do I need? Blood tests Lipid and cholesterol levels. These may be checked every 5 years, or more often depending on your overall health. Hepatitis C test. Hepatitis B test. Screening Lung cancer screening. You may have this screening every year starting at age  11 if you have a 30-pack-year history of smoking and currently smoke or have quit within the past 15 years. Colorectal cancer screening. All adults should have this screening starting at age 67 and continuing until age 25. Your health care provider may recommend screening at age 62 if you are at increased risk. You will have tests every 1-10 years, depending on your results and the type of screening test. Prostate cancer screening. Recommendations will vary depending on your family history and other risks. Genital exam to check for testicular cancer or hernias. Diabetes screening. This is done by checking your blood sugar (glucose) after you have not eaten for a while (fasting). You may have this done every 1-3 years. Abdominal aortic aneurysm (AAA) screening. You may need this if you are a current or former smoker. STD (sexually transmitted disease) testing, if you are at risk. Follow these instructions at home: Eating and drinking  Eat a diet that includes fresh fruits and vegetables, whole grains, lean protein, and low-fat dairy products. Limit your intake of foods with high amounts of sugar, saturated fats, and salt. Take vitamin and mineral supplements as recommended by your health care provider. Do not drink alcohol if your health care provider tells you not to drink. If you drink alcohol: Limit how much you have to 0-2 drinks a day. Be aware of how much alcohol is in your drink. In the [place] one drink equals one 12 oz bottle of beer (355 mL), one 5 oz glass of wine (148 mL), or one 1 oz glass of hard liquor (44 mL). Lifestyle Take daily care of your teeth and gums. Brush your teeth every morning and night with  fluoride toothpaste. Floss one time each day. Stay active. Exercise for at least 30 minutes 5 or more days each week. Do not use any products that contain nicotine or tobacco, such as cigarettes, e-cigarettes, and chewing tobacco. If you need help quitting, ask your health care  provider. Do not use drugs. If you are sexually active, practice safe sex. Use a condom or other form of protection to prevent STIs (sexually transmitted infections). Talk with your health care provider about taking a low-dose aspirin or statin. Find healthy ways to cope with stress, such as: Meditation, yoga, or listening to music. Journaling. Talking to a trusted person. Spending time with friends and family. Safety Always wear your seat belt while driving or riding in a vehicle. Do not drive: If you have been drinking alcohol. Do not ride with someone who has been drinking. When you are tired or distracted. While texting. Wear a helmet and other protective equipment during sports activities. If you have firearms in your house, make sure you follow all gun safety procedures. What's next? Visit your health care provider once a year for an annual wellness visit. Ask your health care provider how often you should have your eyes and teeth checked. Stay up to date on all vaccines. This information is not intended to replace advice given to you by your health care provider. Make sure you discuss any questions you have with your health care provider. Document Revised: 01/08/2021 Document Reviewed: 10/25/2018 Elsevier Patient Education  2022 [employer].

## 2021-07-29 ENCOUNTER — Telehealth: Payer: Self-pay | Admitting: Medical

## 2021-07-29 NOTE — Telephone Encounter (Signed)
I spoke with the patient regarding his results. He voices understanding.  I inquired how his leg swelling is currently doing with lifestyle change and HCTZ and he advised he is just noticing swelling to the left leg at this time, but he also has an injury (skin tear) that he feels may be causing some of this. He advised if swelling persist with healing of the left wound, he will call back.   I advised I will forward to Cadence as an FYI, but other wise, we will make no changes at this time.

## 2021-07-29 NOTE — Telephone Encounter (Signed)
Cadence David Stall, PA-C  07/28/2021 10:03 AM EDT     Echo showed LVEF 60-65%, no WMA, G1DD, mildly dilated LA, trivial MR. Can we ask how LLE is with lifestyle changes and HCTZ? If still present may need to switch to lasix 20mg  daily. Thanks

## 2021-07-31 ENCOUNTER — Encounter: Payer: Self-pay | Admitting: Family Medicine

## 2021-08-06 ENCOUNTER — Encounter: Payer: Self-pay | Admitting: Family Medicine

## 2021-08-06 DIAGNOSIS — Z125 Encounter for screening for malignant neoplasm of prostate: Secondary | ICD-10-CM

## 2021-08-10 NOTE — Telephone Encounter (Signed)
See other message.  Risks of PSA testing especially after 70 were discussed, patient aware and still would like PSA test.  PSA ordered for lab only visit unless they are able to add that to his previous blood work.

## 2021-08-18 ENCOUNTER — Other Ambulatory Visit (INDEPENDENT_AMBULATORY_CARE_PROVIDER_SITE_OTHER): Payer: Medicare HMO

## 2021-08-18 ENCOUNTER — Other Ambulatory Visit: Payer: Self-pay

## 2021-08-18 DIAGNOSIS — Z125 Encounter for screening for malignant neoplasm of prostate: Secondary | ICD-10-CM

## 2021-08-18 LAB — PSA: PSA: 0.79 ng/mL (ref 0.10–4.00)

## 2021-09-29 ENCOUNTER — Other Ambulatory Visit: Payer: Self-pay | Admitting: Family Medicine

## 2021-09-29 DIAGNOSIS — E039 Hypothyroidism, unspecified: Secondary | ICD-10-CM

## 2022-05-07 ENCOUNTER — Other Ambulatory Visit: Payer: Self-pay | Admitting: Medical

## 2022-05-09 NOTE — Telephone Encounter (Signed)
Rx refill sent to pharmacy. 

## 2022-05-25 ENCOUNTER — Other Ambulatory Visit: Payer: Self-pay | Admitting: Family Medicine

## 2022-05-25 DIAGNOSIS — E039 Hypothyroidism, unspecified: Secondary | ICD-10-CM

## 2022-06-28 ENCOUNTER — Ambulatory Visit: Payer: Medicare HMO | Admitting: Medical

## 2022-06-28 ENCOUNTER — Encounter: Payer: Self-pay | Admitting: Medical

## 2022-06-28 VITALS — BP 146/80 | HR 60 | Ht 69.0 in | Wt 218.4 lb

## 2022-06-28 DIAGNOSIS — E782 Mixed hyperlipidemia: Secondary | ICD-10-CM

## 2022-06-28 DIAGNOSIS — I10 Essential (primary) hypertension: Secondary | ICD-10-CM | POA: Diagnosis not present

## 2022-06-28 DIAGNOSIS — I5032 Chronic diastolic (congestive) heart failure: Secondary | ICD-10-CM

## 2022-06-28 DIAGNOSIS — I493 Ventricular premature depolarization: Secondary | ICD-10-CM | POA: Diagnosis not present

## 2022-06-28 NOTE — Progress Notes (Signed)
Cardiology Office Note:    Date:  06/28/2022   ID:  Terry Vega, DOB 01-19-1946, MRN [id number]  PCP:  Shade Flood, MD  [facility] Cardiologist:  None  [facility] Electrophysiologist:  None   Referring MD: Shade Flood, MD   Chief Complaint: 12 month follow-up  History of Present Illness:    Terry Vega is a 76 y.o. male with a hx of  PVCs, lower extremity swelling, HTN, hypothyroidism who presents for annual follow-up.    He was seen in 2014 for PVCs. Echo showed normal LV function, mildly dilated LA, mild aoritc regurgitation. Nuclear stress test showed no evidence of ischemia. Lower leg edema improved with switching from diltiazem to coreg.   Last seen 06/2021 and was overall stable from a cardiac perspective.   Today, the patient reports no significant changes since the last visit. No chest pain or shortness of breath. Occasional LLE at the end of the day. No orthopnea or pnd. No lightheadedness or dizziness. No palpitations. Needs refills or creog and losartan. BP is a high today. He says Bp at home is 130/80. PCP will do labs.   Past Medical History:  Diagnosis Date   Allergy    Arthritis    Phreesia 07/21/2020   Heart murmur    Hypertension    Thyroid disease     Past Surgical History:  Procedure Laterality Date   COLONOSCOPY     HEMORRHOID SURGERY     VASECTOMY      Current Medications: Current Meds  Medication Sig   aspirin 81 MG tablet Take 81 mg by mouth daily.   carvedilol (COREG) 6.25 MG tablet TAKE 1 TABLET TWICE DAILY   cholecalciferol (VITAMIN D) 1000 UNITS tablet Take 1,000 Units by mouth daily.   Cyanocobalamin (VITAMIN B12 PO) Take 1,000 mg by mouth 2 (two) times daily.    levothyroxine (SYNTHROID) 100 MCG tablet TAKE 1 TABLET EVERY DAY   loratadine (CLARITIN) 10 MG tablet Take 10 mg by mouth daily as needed.    losartan-hydrochlorothiazide (HYZAAR) 100-25 MG tablet TAKE 1 TABLET EVERY DAY   Multiple Vitamin  (MULTIVITAMIN) tablet Take 1 tablet by mouth daily.   Naproxen Sodium (ALEVE PO) Take by mouth as needed.   PREVIDENT 5000 SENSITIVE 1.1-5 % PSTE    vitamin E 1000 UNIT capsule Take 1,000 Units by mouth daily.     Allergies:   Patient has no known allergies.   Social History   Socioeconomic History   Marital status: Married    Spouse name: Not on file   Number of children: Not on file   Years of education: Not on file   Highest education level: Not on file  Occupational History   Not on file  Tobacco Use   Smoking status: Never   Smokeless tobacco: Never  Vaping Use   Vaping Use: Never used  Substance and Sexual Activity   Alcohol use: Yes    Comment: occasionally   Drug use: No   Sexual activity: Yes    Birth control/protection: None  Other Topics Concern   Not on file  Social History Narrative   Not on file   Social Determinants of Health   Financial Resource Strain: Not on file  Food Insecurity: Not on file  Transportation Needs: Not on file  Physical Activity: Not on file  Stress: Not on file  Social Connections: Not on file     Family History: The patient's family history includes Cancer in  his father; Diabetes in his father and maternal grandmother; Heart disease in his mother; Stroke in his maternal grandmother. There is no history of Colon cancer, Esophageal cancer, Rectal cancer, or Stomach cancer.  ROS:   Please see the history of present illness.     All other systems reviewed and are negative.  EKGs/Labs/Other Studies Reviewed:    The following studies were reviewed today:  Echo 07/2021  1. Left ventricular ejection fraction, by estimation, is 60 to 65%. The  left ventricle has normal function. The left ventricle has no regional  wall motion abnormalities. There is mild left ventricular hypertrophy.  Left ventricular diastolic parameters  are consistent with Grade I diastolic dysfunction (impaired relaxation).   2. Right ventricular systolic  function is normal. The right ventricular  size is mildly enlarged. Tricuspid regurgitation signal is inadequate for  assessing PA pressure.   3. Left atrial size was mildly dilated.   4. The mitral valve is normal in structure. Trivial mitral valve  regurgitation. No evidence of mitral stenosis.   5. The aortic valve is tricuspid. There is mild thickening of the aortic  valve. Aortic valve regurgitation is trivial. Mild aortic valve sclerosis  is present, with no evidence of aortic valve stenosis.   Comparison(s): LVEF 60-65%.   Echo 2014 Study Conclusions   - Left ventricle: The cavity size was normal. There was mild    concentric hypertrophy. Systolic function was normal. The    estimated ejection fraction was in the range of 60% to    65%. Wall motion was normal; there were no regional wall    motion abnormalities. Left ventricular diastolic function    parameters were normal.  - Aortic valve: Mild regurgitation.  - Left atrium: The atrium was mildly dilated.  Transthoracic echocardiography.  M-mode, complete 2D,  spectral Doppler, and color Doppler.  Height:  Height:  172.7cm. Height: 68in.  Weight:  Weight: 95.3kg. Weight:  209.6lb.  Body mass index:  BMI: 31.9kg/m^2.  Body surface  area:    BSA: 2.9m^2.  Blood pressure:     162/80.  Patient  status:  Outpatient.   EKG:  EKG is ordered today.  The ekg ordered today demonstrates NSR 60bpm, LAD, TWI III  Recent Labs: 07/28/2021: ALT 13; BUN 15; Creatinine, Ser 0.86; Potassium 3.7; Sodium 140; TSH 2.65  Recent Lipid Panel    Component Value Date/Time   CHOL 175 07/28/2021 0939   CHOL 162 01/23/2020 1342   TRIG 90.0 07/28/2021 0939   HDL 44.50 07/28/2021 0939   HDL 41 01/23/2020 1342   CHOLHDL 4 07/28/2021 0939   VLDL 18.0 07/28/2021 0939   LDLCALC 112 (H) 07/28/2021 0939   LDLCALC 102 (H) 01/23/2020 1342     Physical Exam:    VS:  BP (!) 146/80 (BP Location: Left Arm, Patient Position: Sitting, Cuff Size: Normal)    Pulse 60   Ht 5\' 9"  (1.753 m)   Wt 218 lb 6.4 oz (99.1 kg)   SpO2 94%   BMI 32.25 kg/m     Wt Readings from Last 3 Encounters:  06/28/22 218 lb 6.4 oz (99.1 kg)  07/28/21 209 lb 6.4 oz (95 kg)  06/30/21 211 lb 2 oz (95.8 kg)     GEN:  Well nourished, well developed in no acute distress HEENT: Normal NECK: No JVD; No carotid bruits LYMPHATICS: No lymphadenopathy CARDIAC: RRR, no murmurs, rubs, gallops RESPIRATORY:  Clear to auscultation without rales, wheezing or rhonchi  ABDOMEN: Soft, non-tender, non-distended  MUSCULOSKELETAL:  No edema; No deformity  SKIN: Warm and dry NEUROLOGIC:  Alert and oriented x 3 PSYCHIATRIC:  Normal affect   ASSESSMENT:    1. Chronic diastolic heart failure (HCC)   2. Essential hypertension   3. PVC (premature ventricular contraction)   4. Hyperlipidemia, mixed    PLAN:    In order of problems listed above:  HFpEF Patient appears euvolemic on exam today. Echo 07/2021 showed LVEF 60-65%, mild LVH, G1DD, normal RVSF.  Continue Coreg and Hyzaar.   PVCs Patient denies palpitations. Continue Coreg.   HTN 154/80 on re-check. Patient says BP at home is 130/80s. He is taking Coreg 6.25mg BID and Hyzaar 100-25mg  daily. I recommend he continue to check BP at home and he has a visit with his PCP in September, where BP medications can be changed if necessary at that point.   HLD LDL 112 in 2022. He is not on cholesterol therapy. PCP will re-check labs.   Disposition: Follow up in 1 year(s) with MD/APP     Signed, Andree Golphin David Stall, PA-C  06/28/2022 3:25 PM    [facility]

## 2022-06-28 NOTE — Patient Instructions (Addendum)
Medication Instructions:   Your physician recommends that you continue on your current medications as directed. Please refer to the Current Medication list given to you today.  *If you need a refill on your cardiac medications before your next appointment, please call your pharmacy*   Lab Work:  None ordered  Testing/Procedures:  None ordered   Follow-Up: At [facility], you and your health needs are our priority.  As part of our continuing mission to provide you with exceptional heart care, we have created designated Provider Care Teams.  These Care Teams include your primary Cardiologist (physician) and Advanced Practice Providers (APPs -  Physician Assistants and Nurse Practitioners) who all work together to provide you with the care you need, when you need it.  We recommend signing up for the patient portal called "MyChart".  Sign up information is provided on this After Visit Summary.  MyChart is used to connect with patients for Virtual Visits (Telemedicine).  Patients are able to view lab/test results, encounter notes, upcoming appointments, etc.  Non-urgent messages can be sent to your provider as well.   To learn more about what you can do with MyChart, go to [email or web address].    Your next appointment:   1 year(s)  The format for your next appointment:   In Person  Provider:   Lorine Bears, MD    Other Instructions  Monitor your blood pressures at home.  Tips to Measure your Blood Pressure Correctly  To determine whether you have hypertension, a medical professional will take a blood pressure reading. How you prepare for the test, the position of your arm, and other factors can change a blood pressure reading by 10% or more. That could be enough to hide high blood pressure, start you on a drug you don't really need, or lead your doctor to incorrectly adjust your medications.  National and international guidelines offer specific instructions for measuring  blood pressure. If a doctor, nurse, or medical assistant isn't doing it right, don't hesitate to ask him or her to get with the guidelines.  Here's what you can do to ensure a correct reading:  Don't drink a caffeinated beverage or smoke during the 30 minutes before the test.  Sit quietly for five minutes before the test begins.  During the measurement, sit in a chair with your feet on the floor and your arm supported so your elbow is at about heart level.  The inflatable part of the cuff should completely cover at least 80% of your upper arm, and the cuff should be placed on bare skin, not over a shirt.  Don't talk during the measurement.  Have your blood pressure measured twice, with a brief break in between. If the readings are different by 5 points or more, have it done a third time.  In 2017, new guidelines from the American Heart Association, the [employer] of Cardiology, and nine other health organizations lowered the diagnosis of high blood pressure to 130/80 mm Hg or higher for all adults. The guidelines also redefined the various blood pressure categories to now include normal, elevated, Stage 1 hypertension, Stage 2 hypertension, and hypertensive crisis (see "Blood pressure categories").  Blood pressure categories  Blood pressure category SYSTOLIC (upper number)  DIASTOLIC (lower number)  Normal Less than 120 mm Hg and Less than 80 mm Hg  Elevated 120-129 mm Hg and Less than 80 mm Hg  High blood pressure: Stage 1 hypertension 130-139 mm Hg or 80-89 mm Hg  High blood pressure:  Stage 2 hypertension 140 mm Hg or higher or 90 mm Hg or higher  Hypertensive crisis (consult your doctor immediately) Higher than 180 mm Hg and/or Higher than 120 mm Hg  Source: American Heart Association and American Stroke Association. For more on getting your blood pressure under control, buy Controlling Your Blood Pressure, a Special Health Report from [facility].   Blood Pressure  Log   Date   Time  Blood Pressure  Position  Example: Nov 1 9 AM 124/78 sitting                                                       Important Information About Sugar

## 2022-08-01 ENCOUNTER — Ambulatory Visit (INDEPENDENT_AMBULATORY_CARE_PROVIDER_SITE_OTHER): Payer: Medicare HMO | Admitting: Family Medicine

## 2022-08-01 ENCOUNTER — Encounter: Payer: Self-pay | Admitting: Family Medicine

## 2022-08-01 VITALS — BP 132/76 | HR 69 | Temp 98.3°F | Resp 18 | Ht 69.0 in | Wt 215.0 lb

## 2022-08-01 DIAGNOSIS — E039 Hypothyroidism, unspecified: Secondary | ICD-10-CM

## 2022-08-01 DIAGNOSIS — R21 Rash and other nonspecific skin eruption: Secondary | ICD-10-CM

## 2022-08-01 DIAGNOSIS — Z23 Encounter for immunization: Secondary | ICD-10-CM | POA: Diagnosis not present

## 2022-08-01 DIAGNOSIS — Z125 Encounter for screening for malignant neoplasm of prostate: Secondary | ICD-10-CM | POA: Diagnosis not present

## 2022-08-01 DIAGNOSIS — Z0000 Encounter for general adult medical examination without abnormal findings: Secondary | ICD-10-CM | POA: Diagnosis not present

## 2022-08-01 DIAGNOSIS — I10 Essential (primary) hypertension: Secondary | ICD-10-CM | POA: Diagnosis not present

## 2022-08-01 DIAGNOSIS — E785 Hyperlipidemia, unspecified: Secondary | ICD-10-CM | POA: Diagnosis not present

## 2022-08-01 LAB — COMPREHENSIVE METABOLIC PANEL
ALT: 20 U/L (ref 0–53)
AST: 21 U/L (ref 0–37)
Albumin: 4.2 g/dL (ref 3.5–5.2)
Alkaline Phosphatase: 35 U/L — ABNORMAL LOW (ref 39–117)
BUN: 17 mg/dL (ref 6–23)
CO2: 32 mEq/L (ref 19–32)
Calcium: 9.5 mg/dL (ref 8.4–10.5)
Chloride: 100 mEq/L (ref 96–112)
Creatinine, Ser: 1.03 mg/dL (ref 0.40–1.50)
GFR: 70.49 mL/min (ref >60.00)
Glucose, Bld: 95 mg/dL (ref 70–99)
Potassium: 3.9 mEq/L (ref 3.5–5.1)
Sodium: 141 mEq/L (ref 135–145)
Total Bilirubin: 0.9 mg/dL (ref 0.2–1.2)
Total Protein: 7.2 g/dL (ref 6.0–8.3)

## 2022-08-01 LAB — TSH: TSH: 2.71 u[IU]/mL (ref 0.35–5.50)

## 2022-08-01 LAB — LIPID PANEL
Cholesterol: 161 mg/dL (ref 0–200)
HDL: 38.9 mg/dL — ABNORMAL LOW (ref >39.00)
LDL Cholesterol: 100 mg/dL — ABNORMAL HIGH (ref 0–99)
NonHDL: 122.16
Total CHOL/HDL Ratio: 4
Triglycerides: 112 mg/dL (ref 0.0–149.0)
VLDL: 22.4 mg/dL (ref 0.0–40.0)

## 2022-08-01 LAB — PSA, MEDICARE: PSA: 0.61 ng/ml (ref 0.10–4.00)

## 2022-08-01 MED ORDER — TRIAMCINOLONE ACETONIDE 0.1 % EX CREA: 1.0000 | TOPICAL_CREAM | Freq: Two times a day (BID) | CUTANEOUS | 1 refills | Status: DC

## 2022-08-01 MED ORDER — CARVEDILOL 6.25 MG PO TABS: 6.2500 mg | ORAL_TABLET | Freq: Two times a day (BID) | ORAL | 3 refills | Status: DC

## 2022-08-01 MED ORDER — LEVOTHYROXINE SODIUM 100 MCG PO TABS: 100.0000 ug | ORAL_TABLET | Freq: Every day | ORAL | 3 refills | Status: DC

## 2022-08-01 MED ORDER — LOSARTAN POTASSIUM-HCTZ 100-25 MG PO TABS: 1.0000 | ORAL_TABLET | Freq: Every day | ORAL | 3 refills | Status: DC

## 2022-08-01 NOTE — Patient Instructions (Addendum)
If BP remains above 140/90 - let me know and we can adjust meds.  Keep watching diet, stay active to manage cholesterol, weight.  Try Eucerin lotion and steroid cream twice per day to ankle rash. Recheck in next few weeks if not resolving.  Return to the clinic or go to the nearest emergency room if any of your symptoms worsen or new symptoms occur.   Preventive Care 28 Years and Older, Male Preventive care refers to lifestyle choices and visits with your health care provider that can promote health and wellness. Preventive care visits are also called wellness exams. What can I expect for my preventive care visit? Counseling During your preventive care visit, your health care provider may ask about your: Medical history, including: Past medical problems. Family medical history. History of falls. Current health, including: Emotional well-being. Home life and relationship well-being. Sexual activity. Memory and ability to understand (cognition). Lifestyle, including: Alcohol, nicotine or tobacco, and drug use. Access to firearms. Diet, exercise, and sleep habits. Work and work Statistician. Sunscreen use. Safety issues such as seatbelt and bike helmet use. Physical exam Your health care provider will check your: Height and weight. These may be used to calculate your BMI (body mass index). BMI is a measurement that tells if you are at a healthy weight. Waist circumference. This measures the distance around your waistline. This measurement also tells if you are at a healthy weight and may help predict your risk of certain diseases, such as type 2 diabetes and high blood pressure. Heart rate and blood pressure. Body temperature. Skin for abnormal spots. What immunizations do I need?  Vaccines are usually given at various ages, according to a schedule. Your health care provider will recommend vaccines for you based on your age, medical history, and lifestyle or other factors, such as  travel or where you work. What tests do I need? Screening Your health care provider may recommend screening tests for certain conditions. This may include: Lipid and cholesterol levels. Diabetes screening. This is done by checking your blood sugar (glucose) after you have not eaten for a while (fasting). Hepatitis C test. Hepatitis B test. HIV (human immunodeficiency virus) test. STI (sexually transmitted infection) testing, if you are at risk. Lung cancer screening. Colorectal cancer screening. Prostate cancer screening. Abdominal aortic aneurysm (AAA) screening. You may need this if you are a current or former smoker. Talk with your health care provider about your test results, treatment options, and if necessary, the need for more tests. Follow these instructions at home: Eating and drinking  Eat a diet that includes fresh fruits and vegetables, whole grains, lean protein, and low-fat dairy products. Limit your intake of foods with high amounts of sugar, saturated fats, and salt. Take vitamin and mineral supplements as recommended by your health care provider. Do not drink alcohol if your health care provider tells you not to drink. If you drink alcohol: Limit how much you have to 0-2 drinks a day. Know how much alcohol is in your drink. In the [place] one drink equals one 12 oz bottle of beer (355 mL), one 5 oz glass of wine (148 mL), or one 1 oz glass of hard liquor (44 mL). Lifestyle Brush your teeth every morning and night with fluoride toothpaste. Floss one time each day. Exercise for at least 30 minutes 5 or more days each week. Do not use any products that contain nicotine or tobacco. These products include cigarettes, chewing tobacco, and vaping devices, such as e-cigarettes. If  you need help quitting, ask your health care provider. Do not use drugs. If you are sexually active, practice safe sex. Use a condom or other form of protection to prevent STIs. Take aspirin only as  told by your health care provider. Make sure that you understand how much to take and what form to take. Work with your health care provider to find out whether it is safe and beneficial for you to take aspirin daily. Ask your health care provider if you need to take a cholesterol-lowering medicine (statin). Find healthy ways to manage stress, such as: Meditation, yoga, or listening to music. Journaling. Talking to a trusted person. Spending time with friends and family. Safety Always wear your seat belt while driving or riding in a vehicle. Do not drive: If you have been drinking alcohol. Do not ride with someone who has been drinking. When you are tired or distracted. While texting. If you have been using any mind-altering substances or drugs. Wear a helmet and other protective equipment during sports activities. If you have firearms in your house, make sure you follow all gun safety procedures. Minimize exposure to UV radiation to reduce your risk of skin cancer. What's next? Visit your health care provider once a year for an annual wellness visit. Ask your health care provider how often you should have your eyes and teeth checked. Stay up to date on all vaccines. This information is not intended to replace advice given to you by your health care provider. Make sure you discuss any questions you have with your health care provider. Document Revised: 04/28/2021 Document Reviewed: 04/28/2021 Elsevier Patient Education  2023 [employer].

## 2022-08-01 NOTE — Progress Notes (Signed)
Subjective:  Patient ID: Terry Vega, male    DOB: 06-05-1946  Age: 76 y.o. MRN: [id number]  CC:  Chief Complaint  Patient presents with   Annual Exam    Patient states he needs a cpe    HPI Terry Vega presents for Annual Exam Care team: PCP, me Cardiology,  Dr. Kirke Corin, [place], [facility].  Appt 8/15.  Optho: Jimmye Norman.  Doing well, no health changes since last visit.   Cardiac: PVC's, HTN,   Cardiology visit August 15.  Previous echo with normal LV function, EF 60 to 65% with grade 1 diastolic dysfunction.  Mild dilated LA, mild aortic regurgitation.  Prior stress testing without evidence of ischemia.  History of lower leg edema improved with switching from diltiazem to Coreg.  Continued on Coreg 6.25 mg twice daily and losartan HCTZ 100/25 mg daily..  Mild elevation of BP at cardiology.  1 year follow-up planned.  Mild hyperlipidemia previously, no current statin.  Home BP: 138-140/80.  No added salt.  BP Readings from Last 3 Encounters:  08/01/22 132/76  06/28/22 (!) 146/80  07/28/21 140/82   Lab Results  Component Value Date   CREATININE 0.86 07/28/2021   Lab Results  Component Value Date   CHOL 175 07/28/2021   HDL 44.50 07/28/2021   LDLCALC 112 (H) 07/28/2021   TRIG 90.0 07/28/2021   CHOLHDL 4 07/28/2021    Hypothyroidism: Lab Results  Component Value Date   TSH 2.65 07/28/2021  Taking medication daily.  Synthroid .  No new hot or cold intolerance. No new hair or skin changes, heart palpitations or new fatigue. No new weight changes.        07/28/2021    8:25 AM 07/23/2020    8:12 AM 01/23/2020   11:17 AM 05/10/2019   10:06 AM 04/30/2019   10:03 AM  Depression screen PHQ 2/9  Decreased Interest 0 0 0 0 0  Down, Depressed, Hopeless 0 0 0 0 0  PHQ - 2 Score 0 0 0 0 0  Altered sleeping 0      Tired, decreased energy 0      Change in appetite 0      Feeling bad or failure about yourself  0      Trouble concentrating 0      Moving  slowly or fidgety/restless 0      Suicidal thoughts 0      PHQ-9 Score 0        Health Maintenance  Topic Date Due   COVID-19 Vaccine (4 - Moderna series) 08/17/2022 (Originally 11/24/2020)   Zoster Vaccines- Shingrix (1 of 2) 10/31/2022 (Originally 12/08/1995)   TETANUS/TDAP  05/09/2029   Pneumonia Vaccine 33+ Years old  Completed   INFLUENZA VACCINE  Completed   Hepatitis C Screening  Completed   HPV VACCINES  Aged Out   COLONOSCOPY (Pts 45-76yrs Insurance coverage will need to be confirmed)  Discontinued  Colonoscopy 05/06/2015, repeat 10 years Prostate: The natural history of prostate cancer and ongoing controversy regarding screening and potential treatment outcomes of prostate cancer has been discussed with the patient. The meaning of a false positive PSA and a false negative PSA has been discussed, as well as ages fo screening discussed with false positives and risks of biopsy if indicated. He indicates understanding of the limitations of this screening test and wishes to proceed with screening PSA testing. Lab Results  Component Value Date   PSA1 0.7 09/03/2020   PSA1 0.5 05/03/2019  PSA1 0.6 04/23/2018   PSA 0.79 08/18/2021   PSA 0.57 03/17/2016   PSA 0.59 02/24/2015  No dermatologist. Dry, itchy spot on left ankle. No new moles. Tx: aloe - helps with itching.    Immunization History  Administered Date(s) Administered   Fluad Quad(high Dose 65+) 07/23/2020, 07/28/2021, 08/01/2022   Influenza Split 08/13/2014   Influenza, High Dose Seasonal PF 08/29/2017, 08/23/2018, 08/14/2019   Influenza,inj,Quad PF,6+ Mos 10/09/2013   Influenza-Unspecified 08/24/2016, 08/29/2018   Moderna Sars-Covid-2 Vaccination 12/19/2019, 01/14/2020, 09/29/2020   Pneumococcal Conjugate-13 09/11/2014   Pneumococcal Polysaccharide-23 04/20/2017   Td 12/23/2008, 05/10/2019   Zoster, Live 04/03/2016, 07/13/2016  Flu vaccine: today.  COVID booster: recommended.  Shingles vaccine: recommended at  pharmacy.   No results found. Followed by ophthalmology as above, Dr. Sabra Heck - appt in October.   Dental: Every 6 months  Alcohol: occasional - up to few per week.   Tobacco: none.   Exercise: active at church, other activities at home, lawn work, outside yard work.  Wt Readings from Last 3 Encounters:  08/01/22 215 lb (97.5 kg)  06/28/22 218 lb 6.4 oz (99.1 kg)  07/28/21 209 lb 6.4 oz (95 kg)  Body mass index is 31.75 kg/m.   Advance directives, has living will and healthcare power of attorney without changes requested at this time.  History Patient Active Problem List   Diagnosis Date Noted   Leg edema 05/06/2014   PVC (premature ventricular contraction) 10/15/2013   Hypertension 10/08/2012   Hypothyroid 10/08/2012   Past Medical History:  Diagnosis Date   Allergy    Arthritis    [employer] 07/21/2020   Heart murmur    Hypertension    Thyroid disease    Past Surgical History:  Procedure Laterality Date   COLONOSCOPY     HEMORRHOID SURGERY     VASECTOMY     No Known Allergies Prior to Admission medications   Medication Sig Start Date End Date Taking? Authorizing Provider  aspirin 81 MG tablet Take 81 mg by mouth daily.   Yes [provider]  carvedilol (COREG) 6.25 MG tablet TAKE 1 TABLET TWICE DAILY 05/09/22  Yes Wellington Hampshire, MD  cholecalciferol (VITAMIN D) 1000 UNITS tablet Take 1,000 Units by mouth daily.   Yes [provider]  Cyanocobalamin (VITAMIN B12 PO) Take 1,000 mg by mouth 2 (two) times daily.    Yes [provider]  levothyroxine (SYNTHROID) 100 MCG tablet TAKE 1 TABLET EVERY DAY 05/25/22  Yes Wendie Agreste, MD  losartan-hydrochlorothiazide [facility]) 100-25 MG tablet TAKE 1 TABLET EVERY DAY 05/09/22  Yes Wellington Hampshire, MD  Multiple Vitamin (MULTIVITAMIN) tablet Take 1 tablet by mouth daily.   Yes [provider]  Naproxen Sodium (ALEVE PO) Take by mouth as needed.   Yes [provider]   PREVIDENT 5000 SENSITIVE 1.1-5 % PSTE  04/10/19  Yes [provider]  vitamin E 1000 UNIT capsule Take 1,000 Units by mouth daily.   Yes [provider]  loratadine (CLARITIN) 10 MG tablet Take 10 mg by mouth daily as needed.  Patient not taking: Reported on 08/01/2022    [provider]   Social History   Socioeconomic History   Marital status: Married    Spouse name: Not on file   Number of children: Not on file   Years of education: Not on file   Highest education level: Not on file  Occupational History   Not on file  Tobacco Use  Smoking status: Never   Smokeless tobacco: Never  Vaping Use   Vaping Use: Never used  Substance and Sexual Activity   Alcohol use: Yes    Comment: occasionally   Drug use: No   Sexual activity: Yes    Birth control/protection: None  Other Topics Concern   Not on file  Social History Narrative   Not on file   Social Determinants of Health   Financial Resource Strain: Not on file  Food Insecurity: Not on file  Transportation Needs: Not on file  Physical Activity: Not on file  Stress: Not on file  Social Connections: Not on file  Intimate Partner Violence: Not on file    Review of Systems  Constitutional:  Negative for fatigue and unexpected weight change.  Eyes:  Negative for visual disturbance.  Respiratory:  Negative for cough, chest tightness and shortness of breath.   Cardiovascular:  Negative for chest pain, palpitations and leg swelling.  Gastrointestinal:  Negative for abdominal pain and blood in stool.  Neurological:  Negative for dizziness, light-headedness and headaches.  13 point review of systems per patient health survey noted.  Negative other than as indicated above or in HPI.    Objective:   Vitals:   08/01/22 0758  BP: 132/76  Pulse: 69  Resp: 18  Temp: 98.3 F (36.8 C)  TempSrc: Temporal  SpO2: 95%  Weight: 215 lb (97.5 kg)  Height: 5\' 9"  (1.753 m)     Physical  Exam      Assessment & Plan:  Terry Vega is a 76 y.o. male . Annual physical exam  - -anticipatory guidance as below in AVS, screening labs above. Health maintenance items as above in HPI discussed/recommended as applicable.   Need for influenza vaccination - Plan: Flu Vaccine QUAD High Dose(Fluad)  Rash and nonspecific skin eruption - Plan: triamcinolone cream (KENALOG) 0.1 %  -Psoriasis versus eczema.  Eucerin, triamcinolone recommended with RTC precautions if not resolving next few weeks.  Consider dermatology eval.  Screening for prostate cancer  -Risks/benefits of testing discussed as above including increased risk with age.  Chose to have PSA.  Ordered.  Benign essential HTN -Borderline, but continue same regimen for now.  RTC precautions if persistent elevated readings.  Monitor sodium in diet.  Continue same dose losartan HCTZ and carvedilol.  Hypothyroidism, unspecified type Check TSH, continue same dose Synthroid for now.  Hyperlipidemia, unspecified hyperlipidemia type Mild previously, check lipids.  Meds ordered this encounter  Medications   triamcinolone cream (KENALOG) 0.1 %    Sig: Apply 1 Application topically 2 (two) times daily.    Dispense:  30 g    Refill:  1   Patient Instructions  If BP remains above 140/90 - let me know and we can adjust meds.  Keep watching diet, stay active to manage cholesterol, weight.  Try Eucerin lotion and steroid cream twice per day to ankle rash. Recheck in next few weeks if not resolving.  Return to the clinic or go to the nearest emergency room if any of your symptoms worsen or new symptoms occur.   Preventive Care 61 Years and Older, Male Preventive care refers to lifestyle choices and visits with your health care provider that can promote health and wellness. Preventive care visits are also called wellness exams. What can I expect for my preventive care visit? Counseling During your preventive care visit, your  health care provider may ask about your: Medical history, including: Past medical problems. Family medical history.  History of falls. Current health, including: Emotional well-being. Home life and relationship well-being. Sexual activity. Memory and ability to understand (cognition). Lifestyle, including: Alcohol, nicotine or tobacco, and drug use. Access to firearms. Diet, exercise, and sleep habits. Work and work Statistician. Sunscreen use. Safety issues such as seatbelt and bike helmet use. Physical exam Your health care provider will check your: Height and weight. These may be used to calculate your BMI (body mass index). BMI is a measurement that tells if you are at a healthy weight. Waist circumference. This measures the distance around your waistline. This measurement also tells if you are at a healthy weight and may help predict your risk of certain diseases, such as type 2 diabetes and high blood pressure. Heart rate and blood pressure. Body temperature. Skin for abnormal spots. What immunizations do I need?  Vaccines are usually given at various ages, according to a schedule. Your health care provider will recommend vaccines for you based on your age, medical history, and lifestyle or other factors, such as travel or where you work. What tests do I need? Screening Your health care provider may recommend screening tests for certain conditions. This may include: Lipid and cholesterol levels. Diabetes screening. This is done by checking your blood sugar (glucose) after you have not eaten for a while (fasting). Hepatitis C test. Hepatitis B test. HIV (human immunodeficiency virus) test. STI (sexually transmitted infection) testing, if you are at risk. Lung cancer screening. Colorectal cancer screening. Prostate cancer screening. Abdominal aortic aneurysm (AAA) screening. You may need this if you are a current or former smoker. Talk with your health care provider about  your test results, treatment options, and if necessary, the need for more tests. Follow these instructions at home: Eating and drinking  Eat a diet that includes fresh fruits and vegetables, whole grains, lean protein, and low-fat dairy products. Limit your intake of foods with high amounts of sugar, saturated fats, and salt. Take vitamin and mineral supplements as recommended by your health care provider. Do not drink alcohol if your health care provider tells you not to drink. If you drink alcohol: Limit how much you have to 0-2 drinks a day. Know how much alcohol is in your drink. In the [place] one drink equals one 12 oz bottle of beer (355 mL), one 5 oz glass of wine (148 mL), or one 1 oz glass of hard liquor (44 mL). Lifestyle Brush your teeth every morning and night with fluoride toothpaste. Floss one time each day. Exercise for at least 30 minutes 5 or more days each week. Do not use any products that contain nicotine or tobacco. These products include cigarettes, chewing tobacco, and vaping devices, such as e-cigarettes. If you need help quitting, ask your health care provider. Do not use drugs. If you are sexually active, practice safe sex. Use a condom or other form of protection to prevent STIs. Take aspirin only as told by your health care provider. Make sure that you understand how much to take and what form to take. Work with your health care provider to find out whether it is safe and beneficial for you to take aspirin daily. Ask your health care provider if you need to take a cholesterol-lowering medicine (statin). Find healthy ways to manage stress, such as: Meditation, yoga, or listening to music. Journaling. Talking to a trusted person. Spending time with friends and family. Safety Always wear your seat belt while driving or riding in a vehicle. Do not drive: If you  have been drinking alcohol. Do not ride with someone who has been drinking. When you are tired or  distracted. While texting. If you have been using any mind-altering substances or drugs. Wear a helmet and other protective equipment during sports activities. If you have firearms in your house, make sure you follow all gun safety procedures. Minimize exposure to UV radiation to reduce your risk of skin cancer. What's next? Visit your health care provider once a year for an annual wellness visit. Ask your health care provider how often you should have your eyes and teeth checked. Stay up to date on all vaccines. This information is not intended to replace advice given to you by your health care provider. Make sure you discuss any questions you have with your health care provider. Document Revised: 04/28/2021 Document Reviewed: 04/28/2021 Elsevier Patient Education  [place],   [name], MD [place], [facility] 08/01/22 8:41 AM

## 2022-08-31 DIAGNOSIS — H524 Presbyopia: Secondary | ICD-10-CM | POA: Diagnosis not present

## 2022-09-01 ENCOUNTER — Ambulatory Visit (INDEPENDENT_AMBULATORY_CARE_PROVIDER_SITE_OTHER): Payer: Medicare HMO

## 2022-09-01 VITALS — Ht 69.0 in | Wt 218.0 lb

## 2022-09-01 DIAGNOSIS — Z0000 Encounter for general adult medical examination without abnormal findings: Secondary | ICD-10-CM

## 2022-09-01 NOTE — Patient Instructions (Signed)
Terry Vega , Thank you for taking time to come for your Medicare Wellness Visit. I appreciate your ongoing commitment to your health goals. Please review the following plan we discussed and let me know if I can assist you in the future.   These are the goals we discussed:  Goals      Exercise 3x per week (30 min per time)     Weight (lb) < 200 lb (90.7 kg)     10- 15 pounds         This is a list of the screening recommended for you and due dates:  Health Maintenance  Topic Date Due   Zoster (Shingles) Vaccine (1 of 2) 10/31/2022*   COVID-19 Vaccine (5 - Moderna series) 10/24/2022   Tetanus Vaccine  05/09/2029   Pneumonia Vaccine  Completed   Flu Shot  Completed   Hepatitis C Screening: USPSTF Recommendation to screen - Ages 18-79 yo.  Completed   HPV Vaccine  Aged Out   Colon Cancer Screening  Discontinued  *Topic was postponed. The date shown is not the original due date.    Advanced directives: In Chart   Conditions/risks identified: Aim for 30 minutes of exercise or brisk walking, 6-8 glasses of water, and 5 servings of fruits and vegetables each day.   Next appointment: Follow up in one year for your annual wellness visit.   Preventive Care 24 Years and Older, Male  Preventive care refers to lifestyle choices and visits with your health care provider that can promote health and wellness. What does preventive care include? A yearly physical exam. This is also called an annual well check. Dental exams once or twice a year. Routine eye exams. Ask your health care provider how often you should have your eyes checked. Personal lifestyle choices, including: Daily care of your teeth and gums. Regular physical activity. Eating a healthy diet. Avoiding tobacco and drug use. Limiting alcohol use. Practicing safe sex. Taking low doses of aspirin every day. Taking vitamin and mineral supplements as recommended by your health care provider. What happens during an annual well  check? The services and screenings done by your health care provider during your annual well check will depend on your age, overall health, lifestyle risk factors, and family history of disease. Counseling  Your health care provider may ask you questions about your: Alcohol use. Tobacco use. Drug use. Emotional well-being. Home and relationship well-being. Sexual activity. Eating habits. History of falls. Memory and ability to understand (cognition). Work and work Statistician. Screening  You may have the following tests or measurements: Height, weight, and BMI. Blood pressure. Lipid and cholesterol levels. These may be checked every 5 years, or more frequently if you are over 68 years old. Skin check. Lung cancer screening. You may have this screening every year starting at age 81 if you have a 30-pack-year history of smoking and currently smoke or have quit within the past 15 years. Fecal occult blood test (FOBT) of the stool. You may have this test every year starting at age 18. Flexible sigmoidoscopy or colonoscopy. You may have a sigmoidoscopy every 5 years or a colonoscopy every 10 years starting at age 65. Prostate cancer screening. Recommendations will vary depending on your family history and other risks. Hepatitis C blood test. Hepatitis B blood test. Sexually transmitted disease (STD) testing. Diabetes screening. This is done by checking your blood sugar (glucose) after you have not eaten for a while (fasting). You may have this done every 1-3  years. Abdominal aortic aneurysm (AAA) screening. You may need this if you are a current or former smoker. Osteoporosis. You may be screened starting at age 47 if you are at high risk. Talk with your health care provider about your test results, treatment options, and if necessary, the need for more tests. Vaccines  Your health care provider may recommend certain vaccines, such as: Influenza vaccine. This is recommended every  year. Tetanus, diphtheria, and acellular pertussis (Tdap, Td) vaccine. You may need a Td booster every 10 years. Zoster vaccine. You may need this after age 62. Pneumococcal 13-valent conjugate (PCV13) vaccine. One dose is recommended after age 69. Pneumococcal polysaccharide (PPSV23) vaccine. One dose is recommended after age 47. Talk to your health care provider about which screenings and vaccines you need and how often you need them. This information is not intended to replace advice given to you by your health care provider. Make sure you discuss any questions you have with your health care provider. Document Released: 11/27/2015 Document Revised: 07/20/2016 Document Reviewed: 09/01/2015 Elsevier Interactive Patient Education  2017 [place] Prevention in the Home Falls can cause injuries. They can happen to people of all ages. There are many things you can do to make your home safe and to help prevent falls. What can I do on the outside of my home? Regularly fix the edges of walkways and driveways and fix any cracks. Remove anything that might make you trip as you walk through a door, such as a raised step or threshold. Trim any bushes or trees on the path to your home. Use bright outdoor lighting. Clear any walking paths of anything that might make someone trip, such as rocks or tools. Regularly check to see if handrails are loose or broken. Make sure that both sides of any steps have handrails. Any raised decks and porches should have guardrails on the edges. Have any leaves, snow, or ice cleared regularly. Use sand or salt on walking paths during winter. Clean up any spills in your garage right away. This includes oil or grease spills. What can I do in the bathroom? Use night lights. Install grab bars by the toilet and in the tub and shower. Do not use towel bars as grab bars. Use non-skid mats or decals in the tub or shower. If you need to sit down in the shower, use a  plastic, non-slip stool. Keep the floor dry. Clean up any water that spills on the floor as soon as it happens. Remove soap buildup in the tub or shower regularly. Attach bath mats securely with double-sided non-slip rug tape. Do not have throw rugs and other things on the floor that can make you trip. What can I do in the bedroom? Use night lights. Make sure that you have a light by your bed that is easy to reach. Do not use any sheets or blankets that are too big for your bed. They should not hang down onto the floor. Have a firm chair that has side arms. You can use this for support while you get dressed. Do not have throw rugs and other things on the floor that can make you trip. What can I do in the kitchen? Clean up any spills right away. Avoid walking on wet floors. Keep items that you use a lot in easy-to-reach places. If you need to reach something above you, use a strong step stool that has a grab bar. Keep electrical cords out of the way. Do  not use floor polish or wax that makes floors slippery. If you must use wax, use non-skid floor wax. Do not have throw rugs and other things on the floor that can make you trip. What can I do with my stairs? Do not leave any items on the stairs. Make sure that there are handrails on both sides of the stairs and use them. Fix handrails that are broken or loose. Make sure that handrails are as long as the stairways. Check any carpeting to make sure that it is firmly attached to the stairs. Fix any carpet that is loose or worn. Avoid having throw rugs at the top or bottom of the stairs. If you do have throw rugs, attach them to the floor with carpet tape. Make sure that you have a light switch at the top of the stairs and the bottom of the stairs. If you do not have them, ask someone to add them for you. What else can I do to help prevent falls? Wear shoes that: Do not have high heels. Have rubber bottoms. Are comfortable and fit you  well. Are closed at the toe. Do not wear sandals. If you use a stepladder: Make sure that it is fully opened. Do not climb a closed stepladder. Make sure that both sides of the stepladder are locked into place. Ask someone to hold it for you, if possible. Clearly mark and make sure that you can see: Any grab bars or handrails. First and last steps. Where the edge of each step is. Use tools that help you move around (mobility aids) if they are needed. These include: Canes. Walkers. Scooters. Crutches. Turn on the lights when you go into a dark area. Replace any light bulbs as soon as they burn out. Set up your furniture so you have a clear path. Avoid moving your furniture around. If any of your floors are uneven, fix them. If there are any pets around you, be aware of where they are. Review your medicines with your doctor. Some medicines can make you feel dizzy. This can increase your chance of falling. Ask your doctor what other things that you can do to help prevent falls. This information is not intended to replace advice given to you by your health care provider. Make sure you discuss any questions you have with your health care provider. Document Released: 08/27/2009 Document Revised: 04/07/2016 Document Reviewed: 12/05/2014 Elsevier Interactive Patient Education  2017 [employer].

## 2022-09-01 NOTE — Progress Notes (Signed)
Subjective:   Terry Vega is a 76 y.o. male who presents for Medicare Annual/Subsequent preventive examination.   I connected with  [name] on 09/01/22 by a audio enabled telemedicine application and verified that I am speaking with the correct person using two identifiers.  Patient Location: Home  Provider Location: Home Office  I discussed the limitations of evaluation and management by telemedicine. The patient expressed understanding and agreed to proceed.  Review of Systems     Cardiac Risk Factors include: advanced age (>81men, >76 women);hypertension;male gender     Objective:    Today's Vitals   09/01/22 1442  Weight: 218 lb (98.9 kg)  Height: 5\' 9"  (1.753 m)   Body mass index is 32.19 kg/m.     09/01/2022    2:46 PM 08/01/2022    8:03 AM 04/30/2019   10:04 AM 04/23/2018    8:30 AM 10/13/2017    8:49 PM 06/26/2017   11:55 AM 06/23/2017    4:34 PM  Advanced Directives  Does Patient Have a Medical Advance Directive? Yes Yes Yes Yes Yes Yes Yes  Type of 08/23/2017 of [place];Living will  Healthcare Power of [place];Living will Living will Living will Living will Living will  Does patient want to make changes to medical advance directive? No - Patient declined Yes (ED - send information to MyChart) No - Patient declined No - Patient declined  No - Patient declined No - Patient declined  Copy of Healthcare Power of Attorney in Chart? Yes - validated most recent copy scanned in chart (See row information)        Would patient like information on creating a medical advance directive?      No - Patient declined No - Patient declined    Current Medications (verified) Outpatient Encounter Medications as of 09/01/2022  Medication Sig   aspirin 81 MG tablet Take 81 mg by mouth daily.   carvedilol (COREG) 6.25 MG tablet Take 1 tablet (6.25 mg total) by mouth 2 (two) times daily.   cholecalciferol (VITAMIN D) 1000 UNITS tablet Take  1,000 Units by mouth daily.   Cyanocobalamin (VITAMIN B12 PO) Take 1,000 mg by mouth 2 (two) times daily.    levothyroxine (SYNTHROID) 100 MCG tablet Take 1 tablet (100 mcg total) by mouth daily.   losartan-hydrochlorothiazide (HYZAAR) 100-25 MG tablet Take 1 tablet by mouth daily.   Multiple Vitamin (MULTIVITAMIN) tablet Take 1 tablet by mouth daily.   Naproxen Sodium (ALEVE PO) Take by mouth as needed.   PREVIDENT 5000 SENSITIVE 1.1-5 % PSTE    triamcinolone cream (KENALOG) 0.1 % Apply 1 Application topically 2 (two) times daily.   vitamin E 1000 UNIT capsule Take 1,000 Units by mouth daily.   No facility-administered encounter medications on file as of 09/01/2022.    Allergies (verified) Patient has no allergy information on record.   History: Past Medical History:  Diagnosis Date   Allergy    Arthritis    Phreesia 07/21/2020   Heart murmur    Hypertension    Thyroid disease    Past Surgical History:  Procedure Laterality Date   COLONOSCOPY     HEMORRHOID SURGERY     VASECTOMY     Family History  Problem Relation Age of Onset   Heart disease Mother    Cancer Father    Diabetes Father    Stroke Maternal Grandmother    Diabetes Maternal Grandmother    Colon cancer Neg Hx  Esophageal cancer Neg Hx    Rectal cancer Neg Hx    Stomach cancer Neg Hx    Social History   Socioeconomic History   Marital status: Married    Spouse name: Not on file   Number of children: Not on file   Years of education: Not on file   Highest education level: Not on file  Occupational History   Not on file  Tobacco Use   Smoking status: Never   Smokeless tobacco: Never  Vaping Use   Vaping Use: Never used  Substance and Sexual Activity   Alcohol use: Yes    Comment: occasionally   Drug use: No   Sexual activity: Yes    Birth control/protection: None  Other Topics Concern   Not on file  Social History Narrative   Not on file   Social Determinants of Health   Financial  Resource Strain: Low Risk  (09/01/2022)   Overall Financial Resource Strain (CARDIA)    Difficulty of Paying Living Expenses: Not hard at all  Food Insecurity: No Food Insecurity (09/01/2022)   Hunger Vital Sign    Worried About Running Out of Food in the Last Year: Never true    Ran Out of Food in the Last Year: Never true  Transportation Needs: No Transportation Needs (09/01/2022)   PRAPARE - Administrator, Civil Service (Medical): No    Lack of Transportation (Non-Medical): No  Physical Activity: Insufficiently Active (09/01/2022)   Exercise Vital Sign    Days of Exercise per Week: 3 days    Minutes of Exercise per Session: 30 min  Stress: No Stress Concern Present (09/01/2022)   [employer] of Occupational Health - Occupational Stress Questionnaire    Feeling of Stress : Not at all  Social Connections: Socially Integrated (09/01/2022)   Social Connection and Isolation Panel [NHANES]    Frequency of Communication with Friends and Family: More than three times a week    Frequency of Social Gatherings with Friends and Family: More than three times a week    Attends Religious Services: More than 4 times per year    Active Member of [employer] or Organizations: Yes    Attends Engineer, structural: More than 4 times per year    Marital Status: Married    Tobacco Counseling Counseling given: Not Answered   Clinical Intake:  Pre-visit preparation completed: Yes  Pain : No/denies pain     Nutritional Risks: None Diabetes: No  How often do you need to have someone help you when you read instructions, pamphlets, or other written materials from your doctor or pharmacy?: 1 - Never  Diabetic?No   Interpreter Needed?: No  Information entered by :: Renie Ora, LPN   Activities of Daily Living    09/01/2022    2:46 PM 08/29/2022    3:56 PM  In your present state of health, do you have any difficulty performing the following activities:  Hearing? 0  0  Vision? 0 0  Difficulty concentrating or making decisions? 0 0  Walking or climbing stairs? 0 0  Dressing or bathing? 0 0  Doing errands, shopping? 0 0  Preparing Food and eating ? N N  Using the Toilet? N N  In the past six months, have you accidently leaked urine? N N  Do you have problems with loss of bowel control? N N  Managing your Medications? N N  Managing your Finances? N N  Housekeeping or managing your Housekeeping? N  N    Patient Care Team: Wendie Agreste, MD as PCP - General (Family Medicine) Wellington Hampshire, MD as Consulting Physician (Cardiology) Delsa Sale, OD (Optometry)  Indicate any recent Medical Services you may have received from other than [facility] providers in the past year (date may be approximate).     Assessment:   This is a routine wellness examination for Samaad.  Hearing/Vision screen Vision Screening - Comments:: Annual eye exams wear glasses   Dietary issues and exercise activities discussed: Current Exercise Habits: Home exercise routine, Type of exercise: walking, Time (Minutes): 30, Frequency (Times/Week): 3, Weekly Exercise (Minutes/Week): 90, Intensity: Mild, Exercise limited by: None identified   Goals Addressed             This Visit's Progress    Exercise 3x per week (30 min per time)         Depression Screen    09/01/2022    2:44 PM 07/28/2021    8:25 AM 07/23/2020    8:12 AM 01/23/2020   11:17 AM 05/10/2019   10:06 AM 04/30/2019   10:03 AM 10/23/2018   10:04 AM  PHQ 2/9 Scores  PHQ - 2 Score 0 0 0 0 0 0 0  PHQ- 9 Score  0         Fall Risk    09/01/2022    2:43 PM 08/29/2022    3:56 PM 08/01/2022    7:59 AM 07/28/2021    8:24 AM 07/23/2020    8:12 AM  Fall Risk   Falls in the past year? 0 0 0 0 0  Number falls in past yr: 0 0 0 0   Injury with Fall? 0 0 0 0 0  Risk for fall due to : No Fall Risks   No Fall Risks   Follow up Falls prevention discussed  Falls evaluation completed Falls evaluation completed  Falls evaluation completed    [place]:  Any stairs in or around the home? Yes  If so, are there any without handrails? No  Home free of loose throw rugs in walkways, pet beds, electrical cords, etc? Yes  Adequate lighting in your home to reduce risk of falls? Yes   ASSISTIVE DEVICES UTILIZED TO PREVENT FALLS:  Life alert? No  Use of a cane, [place] or w/c? No  Grab bars in the bathroom? Yes  Shower chair or bench in shower? Yes  Elevated toilet seat or a handicapped toilet? Yes         09/01/2022    2:46 PM 08/01/2022    8:02 AM 07/28/2021    8:23 AM 07/23/2020    8:10 AM 04/30/2019   10:06 AM  6CIT Screen  What Year? 0 points 0 points 0 points 0 points 0 points  What month? 0 points 0 points 0 points 0 points 0 points  What time? 0 points 0 points 0 points 0 points 0 points  Count back from 20 0 points 0 points 0 points 0 points 0 points  Months in reverse 0 points 0 points 0 points 0 points 0 points  Repeat phrase 0 points 0 points 0 points 0 points 0 points  Total Score 0 points 0 points 0 points 0 points 0 points    Immunizations Immunization History  Administered Date(s) Administered   Fluad Quad(high Dose 65+) 07/23/2020, 07/28/2021, 08/01/2022   Influenza Split 08/13/2014   Influenza, High Dose Seasonal PF 08/29/2017, 08/23/2018, 08/14/2019   Influenza,inj,Quad  PF,6+ Mos 10/09/2013   Influenza-Unspecified 08/24/2016, 08/29/2018   Moderna Sars-Covid-2 Vaccination 12/19/2019, 01/14/2020, 09/29/2020   PFIZER(Purple Top)SARS-COV-2 Vaccination 08/29/2022   Pneumococcal Conjugate-13 09/11/2014   Pneumococcal Polysaccharide-23 04/20/2017   Td 12/23/2008, 05/10/2019   Zoster, Live 04/03/2016, 07/13/2016    TDAP status: Up to date  Flu Vaccine status: Up to date  Pneumococcal vaccine status: Up to date  Covid-19 vaccine status: Completed vaccines  Qualifies for Shingles Vaccine? Yes   Zostavax completed No   Shingrix  Completed?: No.    Education has been provided regarding the importance of this vaccine. Patient has been advised to call insurance company to determine out of pocket expense if they have not yet received this vaccine. Advised may also receive vaccine at local pharmacy or Health Dept. Verbalized acceptance and understanding.  Screening Tests Health Maintenance  Topic Date Due   Zoster Vaccines- Shingrix (1 of 2) 10/31/2022 (Originally 12/08/1995)   COVID-19 Vaccine (5 - Moderna series) 10/24/2022   TETANUS/TDAP  05/09/2029   Pneumonia Vaccine 25+ Years old  Completed   INFLUENZA VACCINE  Completed   Hepatitis C Screening  Completed   HPV VACCINES  Aged Out   COLONOSCOPY (Pts 45-70yrs Insurance coverage will need to be confirmed)  Discontinued    Health Maintenance  There are no preventive care reminders to display for this patient.  Colorectal cancer screening: No longer required.   Lung Cancer Screening: (Low Dose CT Chest recommended if Age 61-80 years, 30 pack-year currently smoking OR have quit w/in 15years.) does not qualify.   Lung Cancer Screening Referral: n/a  Additional Screening:  Hepatitis C Screening: does not qualify  Vision Screening: Recommended annual ophthalmology exams for early detection of glaucoma and other disorders of the eye. Is the patient up to date with their annual eye exam?  Yes  Who is the provider or what is the name of the office in which the patient attends annual eye exams? Dr.Miller  If pt is not established with a provider, would they like to be referred to a provider to establish care? No .   Dental Screening: Recommended annual dental exams for proper oral hygiene  Community Resource Referral / Chronic Care Management: CRR required this visit?  No   CCM required this visit?  No      Plan:     I have personally reviewed and noted the following in the patient's chart:   Medical and social history Use of alcohol, tobacco or illicit  drugs  Current medications and supplements including opioid prescriptions. Patient is not currently taking opioid prescriptions. Functional ability and status Nutritional status Physical activity Advanced directives List of other physicians Hospitalizations, surgeries, and ER visits in previous 12 months Vitals Screenings to include cognitive, depression, and falls Referrals and appointments  In addition, I have reviewed and discussed with patient certain preventive protocols, quality metrics, and best practice recommendations. A written personalized care plan for preventive services as well as general preventive health recommendations were provided to patient.     Lorrene Reid, LPN   [id number]   Nurse Notes: None

## 2022-10-02 ENCOUNTER — Other Ambulatory Visit: Payer: Self-pay | Admitting: Cardiovascular Disease

## 2022-10-02 DIAGNOSIS — I10 Essential (primary) hypertension: Secondary | ICD-10-CM

## 2022-11-17 ENCOUNTER — Encounter: Payer: Self-pay | Admitting: Family Medicine

## 2022-12-27 ENCOUNTER — Other Ambulatory Visit: Payer: Self-pay | Admitting: Family Medicine

## 2022-12-27 DIAGNOSIS — R21 Rash and other nonspecific skin eruption: Secondary | ICD-10-CM

## 2023-03-20 ENCOUNTER — Other Ambulatory Visit: Payer: Self-pay

## 2023-03-20 ENCOUNTER — Ambulatory Visit
Admission: RE | Admit: 2023-03-20 | Discharge: 2023-03-20 | Disposition: A | Discharge status: Home / Self Care | Payer: Medicare HMO | Source: Ambulatory Visit | Attending: Internal Medicine | Admitting: Internal Medicine

## 2023-03-20 ENCOUNTER — Ambulatory Visit (INDEPENDENT_AMBULATORY_CARE_PROVIDER_SITE_OTHER): Payer: Medicare HMO

## 2023-03-20 VITALS — BP 134/76 | HR 60 | Temp 98.8°F | Resp 18

## 2023-03-20 DIAGNOSIS — J209 Acute bronchitis, unspecified: Secondary | ICD-10-CM

## 2023-03-20 DIAGNOSIS — R059 Cough, unspecified: Secondary | ICD-10-CM | POA: Diagnosis not present

## 2023-03-20 MED ORDER — PREDNISONE 20 MG PO TABS: 40.0000 mg | ORAL_TABLET | Freq: Every day | ORAL | 0 refills | Status: AC

## 2023-03-20 MED ORDER — BENZONATATE 100 MG PO CAPS: 100.0000 mg | ORAL_CAPSULE | Freq: Three times a day (TID) | ORAL | 0 refills | Status: DC

## 2023-03-20 MED ORDER — ALBUTEROL SULFATE HFA 108 (90 BASE) MCG/ACT IN AERS
2.0000 | INHALATION_SPRAY | Freq: Once | RESPIRATORY_TRACT | Status: AC
  Administered 2023-03-20: 2 via RESPIRATORY_TRACT

## 2023-03-20 MED ORDER — ALBUTEROL SULFATE HFA 108 (90 BASE) MCG/ACT IN AERS: 1.0000 | INHALATION_SPRAY | Freq: Four times a day (QID) | RESPIRATORY_TRACT | 0 refills | Status: DC | PRN

## 2023-03-20 NOTE — ED Triage Notes (Signed)
Pt is here with cough and chest congestion that started a week ago, pt has taken OTC meds to relieve discomfort.

## 2023-03-20 NOTE — ED Provider Notes (Signed)
[facility]    CSN: [id number] Arrival date & time: 03/20/23  1042      History   Chief Complaint Chief Complaint  Patient presents with   Cough    cough and chest congestion hanging on for over a week. - Entered by patient    HPI Terry Vega is a 77 y.o. male.   Patient presents to urgent care for evaluation of cough and congestion that started approximately 7 days ago. Symptoms began with itchy throat, fatigue, cough, and congestion but moved down to the chest 3-4 days ago.  Cough is productive with green/yellow phlegm.  Nasal congestion started out as green and yellow but has started to clear.  No recent fever, chills, headaches, dizziness, orthopnea, leg swelling, shortness of breath, chest pain, nausea, vomiting, diarrhea, abdominal pain, or heart palpitations.  No history of asthma or COPD.  Never smoker, denies drug use.  No recent antipyretic use and currently afebrile.  No recent antibiotic or steroid use in the last 3 months.  No recent known sick contacts with similar symptoms.  Denies history of medical problems causing immunocompromise condition.  He states he feels okay in the morning but then cough becomes worse in the evening.  He has been using Mucinex and Delsym as needed for cough with some relief.  History of pneumonia and is concerned this may be turning into same.   Cough   Past Medical History:  Diagnosis Date   Allergy    Arthritis    Phreesia 07/21/2020   Heart murmur    Hypertension    Thyroid disease     Patient Active Problem List   Diagnosis Date Noted   Leg edema 05/06/2014   PVC (premature ventricular contraction) 10/15/2013   Hypertension 10/08/2012   Hypothyroid 10/08/2012    Past Surgical History:  Procedure Laterality Date   COLONOSCOPY     HEMORRHOID SURGERY     VASECTOMY         Home Medications    Prior to Admission medications   Medication Sig Start Date End Date Taking? Authorizing Provider  albuterol  (VENTOLIN HFA) 108 (90 Base) MCG/ACT inhaler Inhale 1-2 puffs into the lungs every 6 (six) hours as needed for wheezing or shortness of breath. 03/20/23  Yes Carlisle Beers, FNP  benzonatate (TESSALON) 100 MG capsule Take 1 capsule (100 mg total) by mouth every 8 (eight) hours. 03/20/23  Yes Carlisle Beers, FNP  predniSONE (DELTASONE) 20 MG tablet Take 2 tablets (40 mg total) by mouth daily for 5 days. 03/20/23 03/25/23 Yes Carlisle Beers, FNP  aspirin 81 MG tablet Take 81 mg by mouth daily.    [provider]  carvedilol (COREG) 6.25 MG tablet Take 1 tablet (6.25 mg total) by mouth 2 (two) times daily. 08/01/22   Shade Flood, MD  cholecalciferol (VITAMIN D) 1000 UNITS tablet Take 1,000 Units by mouth daily.    [provider]  Cyanocobalamin (VITAMIN B12 PO) Take 1,000 mg by mouth 2 (two) times daily.     [provider]  levothyroxine (SYNTHROID) 100 MCG tablet Take 1 tablet (100 mcg total) by mouth daily. 08/01/22   Shade Flood, MD  losartan-hydrochlorothiazide (HYZAAR) 100-25 MG tablet Take 1 tablet by mouth daily. 08/01/22   Shade Flood, MD  Multiple Vitamin (MULTIVITAMIN) tablet Take 1 tablet by mouth daily.    [provider]  Naproxen Sodium (ALEVE PO) Take by mouth as needed.    [provider]  PREVIDENT 5000 SENSITIVE 1.1-5 % PSTE  04/10/19   [provider]  triamcinolone cream (KENALOG) 0.1 % APPLY 1 APPLICATION TOPICALLY 2 TIMES DAILY. 12/27/22   Shade Flood, MD  vitamin E 1000 UNIT capsule Take 1,000 Units by mouth daily.    [provider]    Family History Family History  Problem Relation Age of Onset   Heart disease Mother    Cancer Father    Diabetes Father    Stroke Maternal Grandmother    Diabetes Maternal Grandmother    Colon cancer Neg Hx    Esophageal cancer Neg Hx    Rectal cancer Neg Hx    Stomach cancer Neg Hx     Social History Social History   Tobacco Use    Smoking status: Never   Smokeless tobacco: Never  Vaping Use   Vaping Use: Never used  Substance Use Topics   Alcohol use: Yes    Comment: occasionally   Drug use: No     Allergies   Patient has no known allergies.   Review of Systems Review of Systems  Respiratory:  Positive for cough.   Per HPI   Physical Exam Triage Vital Signs ED Triage Vitals  Enc Vitals Group     BP 03/20/23 1059 134/76     Pulse Rate 03/20/23 1059 60     Resp 03/20/23 1059 18     Temp 03/20/23 1059 98.8 F (37.1 C)     Temp Source 03/20/23 1059 Oral     SpO2 03/20/23 1059 93 %     Weight --      Height --      Head Circumference --      Peak Flow --      Pain Score 03/20/23 1057 0     Pain Loc --      Pain Edu? --      Excl. in GC? --    No data found.  Updated Vital Signs BP 134/76 (BP Location: Left Arm)   Pulse 60   Temp 98.8 F (37.1 C) (Oral)   Resp 18   SpO2 93%   Visual Acuity Right Eye Distance:   Left Eye Distance:   Bilateral Distance:    Right Eye Near:   Left Eye Near:    Bilateral Near:     Physical Exam Vitals and nursing note reviewed.  Constitutional:      Appearance: He is not ill-appearing or toxic-appearing.  HENT:     Head: Normocephalic and atraumatic.     Right Ear: Hearing, tympanic membrane, ear canal and external ear normal.     Left Ear: Hearing, tympanic membrane, ear canal and external ear normal.     Nose: Congestion present.     Mouth/Throat:     Lips: Pink.     Mouth: Mucous membranes are moist. No injury.     Tongue: No lesions. Tongue does not deviate from midline.     Palate: No mass and lesions.     Pharynx: Oropharynx is clear. Uvula midline. No pharyngeal swelling, oropharyngeal exudate, posterior oropharyngeal erythema or uvula swelling.     Tonsils: No tonsillar exudate or tonsillar abscesses.  Eyes:     General: Lids are normal. Vision grossly intact. Gaze aligned appropriately.     Extraocular Movements: Extraocular movements  intact.     Conjunctiva/sclera: Conjunctivae normal.  Cardiovascular:     Rate and Rhythm: Normal rate and regular rhythm.  Heart sounds: Normal heart sounds, S1 normal and S2 normal.  Pulmonary:     Effort: Pulmonary effort is normal. No respiratory distress.     Breath sounds: Normal air entry. No stridor. Wheezing (Focal wheezing to the right lower lobe) present. No rhonchi or rales.  Chest:     Chest wall: No tenderness.  Musculoskeletal:     Cervical back: Neck supple.     Right lower leg: No edema.     Left lower leg: No edema.  Lymphadenopathy:     Cervical: No cervical adenopathy.  Skin:    General: Skin is warm and dry.     Capillary Refill: Capillary refill takes less than 2 seconds.     Findings: No rash.  Neurological:     General: No focal deficit present.     Mental Status: He is alert and oriented to person, place, and time. Mental status is at baseline.     Cranial Nerves: No dysarthria or facial asymmetry.  Psychiatric:        Mood and Affect: Mood normal.        Speech: Speech normal.        Behavior: Behavior normal.        Thought Content: Thought content normal.        Judgment: Judgment normal.      UC Treatments / Results  Labs (all labs ordered are listed, but only abnormal results are displayed) Labs Reviewed - No data to display  EKG   Radiology DG Chest 2 View  Result Date: 03/20/2023 CLINICAL DATA:  Cough for 1 week, history of pneumonia EXAM: CHEST - 2 VIEW COMPARISON:  02/15/2018 FINDINGS: Normal heart size, mediastinal contours, and pulmonary vascularity. Chronic bronchitic changes. Questionable nodular density RIGHT mid lung on the PA view appears to represent a button on the lateral view at the anterior chest wall. No pulmonary infiltrate, pleural effusion, or pneumothorax. Scattered degenerative disc disease changes thoracic spine. IMPRESSION: Chronic bronchitic changes without acute abnormalities. Electronically Signed   By: Ulyses Southward M.D.   On: 03/20/2023 11:44    Procedures Procedures (including critical care time)  Medications Ordered in UC Medications  albuterol (VENTOLIN HFA) 108 (90 Base) MCG/ACT inhaler 2 puff (has no administration in time range)    Initial Impression / Assessment and Plan / UC Course  I have reviewed the triage vital signs and the nursing notes.  Pertinent labs & imaging results that were available during my care of the patient were reviewed by me and considered in my medical decision making (see chart for details).   1.  Acute bronchitis Presentation is consistent with acute viral bronchitis that will likely improve with use of steroid and cough medications along with rest and increase fluid intake over the next few days.  Chest x-ray performed to rule out focal consolidation/pneumonia shows chronic bronchitic changes. No indication for antibiotic. May continue over the counter medications as needed. Patient is nontoxic in appearance and is oxygenating well on room air  Patient given albuterol inhaler 2 puffs in clinic for faint expiratory wheezing and cough.  Oral steroid burst and tessalon perles sent to pharmacy for symptomatic relief to be taken as prescribed. No NSAIDs while taking steroid. Advised to push fluids to stay well hydrated.   Discussed physical exam and available lab work findings in clinic with patient.  Counseled patient regarding appropriate use of medications and potential side effects for all medications recommended or prescribed today. Discussed red flag signs  and symptoms of worsening condition,when to call the PCP office, return to urgent care, and when to seek higher level of care in the emergency department. Patient verbalizes understanding and agreement with plan. All questions answered. Patient discharged in stable condition.    Final Clinical Impressions(s) / UC Diagnoses   Final diagnoses:  Acute bronchitis, unspecified organism     Discharge Instructions       You have bronchitis which is inflammation of the upper airways in your lungs due to a virus. The following medicines will help with your symptoms.   - Take steroid pills sent to pharmacy as directed. Do not take any other NSAID containing medications such as ibuprofen or naproxen/Aleve while taking prednisone. - You may use albuterol inhaler 1 to 2 puffs every 4-6 hours as needed for cough, shortness of breath, and wheezing. - Take cough medicines as prescribed.  - Continue using over the counter medicines as needed as directed. Plain mucinex (guaifenesin) over the counter may further help breakup mucus and help with symptoms.   If you develop any new or worsening symptoms or do not improve in the next 2 to 3 days, please return.  If your symptoms are severe, please go to the emergency room.  Follow-up with your primary care provider for further evaluation and management of your symptoms as well as ongoing wellness visits.  I hope you feel better!     ED Prescriptions     Medication Sig Dispense Auth. Provider   benzonatate (TESSALON) 100 MG capsule Take 1 capsule (100 mg total) by mouth every 8 (eight) hours. 21 capsule Reita May M, FNP   albuterol (VENTOLIN HFA) 108 (90 Base) MCG/ACT inhaler Inhale 1-2 puffs into the lungs every 6 (six) hours as needed for wheezing or shortness of breath. 8 g Reita May M, FNP   predniSONE (DELTASONE) 20 MG tablet Take 2 tablets (40 mg total) by mouth daily for 5 days. 10 tablet Carlisle Beers, FNP      PDMP not reviewed this encounter.   Carlisle Beers, [place] 03/20/23 1205

## 2023-03-20 NOTE — Discharge Instructions (Signed)
You have bronchitis which is inflammation of the upper airways in your lungs due to a virus. The following medicines will help with your symptoms.   - Take steroid pills sent to pharmacy as directed. Do not take any other NSAID containing medications such as ibuprofen or naproxen/Aleve while taking prednisone. - You may use albuterol inhaler 1 to 2 puffs every 4-6 hours as needed for cough, shortness of breath, and wheezing. - Take cough medicines as prescribed.  - Continue using over the counter medicines as needed as directed. Plain mucinex (guaifenesin) over the counter may further help breakup mucus and help with symptoms.   If you develop any new or worsening symptoms or do not improve in the next 2 to 3 days, please return.  If your symptoms are severe, please go to the emergency room.  Follow-up with your primary care provider for further evaluation and management of your symptoms as well as ongoing wellness visits.  I hope you feel better! 

## 2023-05-31 ENCOUNTER — Ambulatory Visit: Payer: Medicare HMO | Admitting: *Deleted

## 2023-05-31 DIAGNOSIS — Z0000 Encounter for general adult medical examination without abnormal findings: Secondary | ICD-10-CM | POA: Diagnosis not present

## 2023-05-31 NOTE — Progress Notes (Signed)
Subjective:   Terry Vega is a 77 y.o. male who presents for Medicare Annual/Subsequent preventive examination.  Visit Complete: Virtual  I connected with  Terry Vega on 05/31/23 by a audio enabled telemedicine application and verified that I am speaking with the correct person using two identifiers.  Patient Location: Home  Provider Location: Home Office  I discussed the limitations of evaluation and management by telemedicine. The patient expressed understanding and agreed to proceed.  Patient Medicare AWV questionnaire was completed by the patient on 05-2023; I have confirmed that all information answered by patient is correct and no changes since this date.  Review of Systems     Cardiac Risk Factors include: advanced age (>43men, >69 women);male gender;family history of premature cardiovascular disease;hypertension     Objective:    Today's Vitals   There is no height or weight on file to calculate BMI.     05/31/2023    9:33 AM 09/01/2022    2:46 PM 08/01/2022    8:03 AM 04/30/2019   10:04 AM 04/23/2018    8:30 AM 10/13/2017    8:49 PM 06/26/2017   11:55 AM  Advanced Directives  Does Patient Have a Medical Advance Directive? Yes Yes Yes Yes Yes Yes Yes  Type of Estate agent of [employer] Power of [place];Living will  Healthcare Power of [place];Living will Living will Living will Living will  Does patient want to make changes to medical advance directive?  No - Patient declined Yes (ED - send information to MyChart) No - Patient declined No - Patient declined  No - Patient declined  Copy of Healthcare Power of Attorney in Chart? Yes - validated most recent copy scanned in chart (See row information) Yes - validated most recent copy scanned in chart (See row information)       Would patient like information on creating a medical advance directive?       No - Patient declined    Current Medications (verified) Outpatient  Encounter Medications as of 05/31/2023  Medication Sig   aspirin 81 MG tablet Take 81 mg by mouth daily.   carvedilol (COREG) 6.25 MG tablet Take 1 tablet (6.25 mg total) by mouth 2 (two) times daily.   cholecalciferol (VITAMIN D) 1000 UNITS tablet Take 1,000 Units by mouth daily.   Cyanocobalamin (VITAMIN B12 PO) Take 1,000 mg by mouth 2 (two) times daily.    levothyroxine (SYNTHROID) 100 MCG tablet Take 1 tablet (100 mcg total) by mouth daily.   losartan-hydrochlorothiazide (HYZAAR) 100-25 MG tablet Take 1 tablet by mouth daily.   Multiple Vitamin (MULTIVITAMIN) tablet Take 1 tablet by mouth daily.   Naproxen Sodium (ALEVE PO) Take by mouth as needed.   triamcinolone cream (KENALOG) 0.1 % APPLY 1 APPLICATION TOPICALLY 2 TIMES DAILY.   vitamin E 1000 UNIT capsule Take 1,000 Units by mouth daily.   albuterol (VENTOLIN HFA) 108 (90 Base) MCG/ACT inhaler Inhale 1-2 puffs into the lungs every 6 (six) hours as needed for wheezing or shortness of breath.   benzonatate (TESSALON) 100 MG capsule Take 1 capsule (100 mg total) by mouth every 8 (eight) hours.   PREVIDENT 5000 SENSITIVE 1.1-5 % PSTE    No facility-administered encounter medications on file as of 05/31/2023.    Allergies (verified) Patient has no known allergies.   History: Past Medical History:  Diagnosis Date   Allergy    Arthritis    Phreesia 07/21/2020   Heart murmur    Hypertension  Thyroid disease    Past Surgical History:  Procedure Laterality Date   COLONOSCOPY     HEMORRHOID SURGERY     VASECTOMY     Family History  Problem Relation Age of Onset   Heart disease Mother    Cancer Father    Diabetes Father    Stroke Maternal Grandmother    Diabetes Maternal Grandmother    Colon cancer Neg Hx    Esophageal cancer Neg Hx    Rectal cancer Neg Hx    Stomach cancer Neg Hx    Social History   Socioeconomic History   Marital status: Married    Spouse name: Not on file   Number of children: Not on file    Years of education: Not on file   Highest education level: Not on file  Occupational History   Not on file  Tobacco Use   Smoking status: Never   Smokeless tobacco: Never  Vaping Use   Vaping status: Never Used  Substance and Sexual Activity   Alcohol use: Yes    Comment: occasionally   Drug use: No   Sexual activity: Yes    Birth control/protection: None  Other Topics Concern   Not on file  Social History Narrative   Not on file   Social Determinants of Health   Financial Resource Strain: Low Risk  (05/31/2023)   Overall Financial Resource Strain (CARDIA)    Difficulty of Paying Living Expenses: Not hard at all  Food Insecurity: No Food Insecurity (05/31/2023)   Hunger Vital Sign    Worried About Running Out of Food in the Last Year: Never true    Ran Out of Food in the Last Year: Never true  Transportation Needs: No Transportation Needs (05/31/2023)   PRAPARE - Administrator, Civil Service (Medical): No    Lack of Transportation (Non-Medical): No  Physical Activity: Inactive (05/31/2023)   Exercise Vital Sign    Days of Exercise per Week: 0 days    Minutes of Exercise per Session: 0 min  Stress: No Stress Concern Present (05/31/2023)   [employer] of Occupational Health - Occupational Stress Questionnaire    Feeling of Stress : Not at all  Social Connections: Socially Integrated (05/31/2023)   Social Connection and Isolation Panel [NHANES]    Frequency of Communication with Friends and Family: More than three times a week    Frequency of Social Gatherings with Friends and Family: Once a week    Attends Religious Services: More than 4 times per year    Active Member of [employer] or Organizations: Yes    Attends Engineer, structural: More than 4 times per year    Marital Status: Married    Tobacco Counseling Counseling given: Not Answered   Clinical Intake:  Pre-visit preparation completed: Yes  Pain : No/denies pain     Nutritional  Risks: None Diabetes: No  How often do you need to have someone help you when you read instructions, pamphlets, or other written materials from your doctor or pharmacy?: 1 - Never  Interpreter Needed?: No  Information entered by :: Remi Haggard LPN   Activities of Daily Living    05/31/2023    9:31 AM 05/27/2023   12:29 PM  In your present state of health, do you have any difficulty performing the following activities:  Hearing? 0 0  Vision? 0 0  Difficulty concentrating or making decisions? 0 0  Walking or climbing stairs? 0 0  Dressing or  bathing? 0 0  Doing errands, shopping? 0 0  Preparing Food and eating ? N N  Using the Toilet? N N  In the past six months, have you accidently leaked urine? N N  Do you have problems with loss of bowel control? N N  Managing your Medications? N N  Managing your Finances? N N  Housekeeping or managing your Housekeeping? N N    Patient Care Team: Shade Flood, MD as PCP - General (Family Medicine) Iran Ouch, MD as Consulting Physician (Cardiology) Carman Ching, OD (Optometry)  Indicate any recent Medical Services you may have received from other than [facility] providers in the past year (date may be approximate).     Assessment:   This is a routine wellness examination for Terry Vega.  Hearing/Vision screen Hearing Screening - Comments:: Some hearing loss Has hearing aids Vision Screening - Comments:: Up to date [place]  Dietary issues and exercise activities discussed:     Goals Addressed             This Visit's Progress    Weight (lb) < 200 lb (90.7 kg)         Depression Screen    05/31/2023    9:37 AM 09/01/2022    2:44 PM 07/28/2021    8:25 AM 07/23/2020    8:12 AM 01/23/2020   11:17 AM 05/10/2019   10:06 AM 04/30/2019   10:03 AM  PHQ 2/9 Scores  PHQ - 2 Score 0 0 0 0 0 0 0  PHQ- 9 Score 0  0        Fall Risk    05/31/2023    9:31 AM 05/27/2023   12:29 PM 09/01/2022    2:43 PM 08/29/2022    3:56 PM  08/01/2022    7:59 AM  Fall Risk   Falls in the past year? 0 0 0 0 0  Number falls in past yr: 0 0 0 0 0  Injury with Fall? 0 0 0 0 0  Risk for fall due to :   No Fall Risks    Follow up Falls evaluation completed;Education provided;Falls prevention discussed  Falls prevention discussed  Falls evaluation completed    MEDICARE RISK AT HOME:   TIMED UP AND GO:  Was the test performed?  No    Cognitive Function:        05/31/2023    9:34 AM 09/01/2022    2:46 PM 08/01/2022    8:02 AM 07/28/2021    8:23 AM 07/23/2020    8:10 AM  6CIT Screen  What Year? 0 points 0 points 0 points 0 points 0 points  What month? 0 points 0 points 0 points 0 points 0 points  What time? 0 points 0 points 0 points 0 points 0 points  Count back from 20 0 points 0 points 0 points 0 points 0 points  Months in reverse 0 points 0 points 0 points 0 points 0 points  Repeat phrase 0 points 0 points 0 points 0 points 0 points  Total Score 0 points 0 points 0 points 0 points 0 points    Immunizations Immunization History  Administered Date(s) Administered   Fluad Quad(high Dose 65+) 07/23/2020, 07/28/2021, 08/01/2022   Influenza Split 08/13/2014   Influenza, High Dose Seasonal PF 08/29/2017, 08/23/2018, 08/14/2019   Influenza,inj,Quad PF,6+ Mos 10/09/2013   Influenza-Unspecified 08/24/2016, 08/29/2018   Moderna Sars-Covid-2 Vaccination 12/19/2019, 01/14/2020, 09/29/2020   PFIZER(Purple Top)SARS-COV-2 Vaccination 08/29/2022   Pneumococcal Conjugate-13 09/11/2014  Pneumococcal Polysaccharide-23 04/20/2017   Td 12/23/2008, 05/10/2019   Zoster, Live 04/03/2016, 07/13/2016    TDAP status: Up to date  Flu Vaccine status: Up to date  Pneumococcal vaccine status: Up to date  Covid-19 vaccine status: Information provided on how to obtain vaccines.   Qualifies for Shingles Vaccine? Yes   Zostavax completed No   Shingrix Completed?: No.    Education has been provided regarding the importance of this  vaccine. Patient has been advised to call insurance company to determine out of pocket expense if they have not yet received this vaccine. Advised may also receive vaccine at local pharmacy or Health Dept. Verbalized acceptance and understanding.  Screening Tests Health Maintenance  Topic Date Due   Zoster Vaccines- Shingrix (1 of 2) 12/08/1995   COVID-19 Vaccine (5 - 2023-24 season) 10/24/2022   INFLUENZA VACCINE  06/15/2023   Medicare Annual Wellness (AWV)  05/30/2024   DTaP/Tdap/Td (3 - Tdap) 05/09/2029   Pneumonia Vaccine 1+ Years old  Completed   Hepatitis C Screening  Completed   HPV VACCINES  Aged Out   Colonoscopy  Discontinued    Health Maintenance  Health Maintenance Due  Topic Date Due   Zoster Vaccines- Shingrix (1 of 2) 12/08/1995   COVID-19 Vaccine (5 - 2023-24 season) 10/24/2022    Colorectal cancer screening: No longer required.   Lung Cancer Screening: (Low Dose CT Chest recommended if Age 41-80 years, 20 pack-year currently smoking OR have quit w/in 15years.) does not qualify.   Lung Cancer Screening Referral:   Additional Screening:  Hepatitis C Screening: does not qualify; Completed   Vision Screening: Recommended annual ophthalmology exams for early detection of glaucoma and other disorders of the eye. Is the patient up to date with their annual eye exam?  Yes  Who is the provider or what is the name of the office in which the patient attends annual eye exams? [facility] If pt is not established with a provider, would they like to be referred to a provider to establish care? No .   Dental Screening: Recommended annual dental exams for proper oral hygiene    Community Resource Referral / Chronic Care Management: CRR required this visit?  No   CCM required this visit?  No     Plan:     I have personally reviewed and noted the following in the patient's chart:   Medical and social history Use of alcohol, tobacco or illicit drugs  Current  medications and supplements including opioid prescriptions. Patient is not currently taking opioid prescriptions. Functional ability and status Nutritional status Physical activity Advanced directives List of other physicians Hospitalizations, surgeries, and ER visits in previous 12 months Vitals Screenings to include cognitive, depression, and falls Referrals and appointments  In addition, I have reviewed and discussed with patient certain preventive protocols, quality metrics, and best practice recommendations. A written personalized care plan for preventive services as well as general preventive health recommendations were provided to patient.     Remi Haggard, LPN   7/82/9562   After Visit Summary: (MyChart) Due to this being a telephonic visit, the after visit summary with patients personalized plan was offered to patient via MyChart   Nurse Notes:

## 2023-05-31 NOTE — Patient Instructions (Signed)
[name] , Thank you for taking time to come for your Medicare Wellness Visit. I appreciate your ongoing commitment to your health goals. Please review the following plan we discussed and let me know if I can assist you in the future.   Screening recommendations/referrals: Colonoscopy: no longer required Recommended yearly ophthalmology/optometry visit for glaucoma screening and checkup Recommended yearly dental visit for hygiene and checkup  Vaccinations: Influenza vaccine: up to date Pneumococcal vaccine: up to date Tdap vaccine: up to date Shingles vaccine: up to date    Advanced directives: yes      Preventive Care 65 Years and Older, Male Preventive care refers to lifestyle choices and visits with your health care provider that can promote health and wellness. What does preventive care include? A yearly physical exam. This is also called an annual well check. Dental exams once or twice a year. Routine eye exams. Ask your health care provider how often you should have your eyes checked. Personal lifestyle choices, including: Daily care of your teeth and gums. Regular physical activity. Eating a healthy diet. Avoiding tobacco and drug use. Limiting alcohol use. Practicing safe sex. Taking low doses of aspirin every day. Taking vitamin and mineral supplements as recommended by your health care provider. What happens during an annual well check? The services and screenings done by your health care provider during your annual well check will depend on your age, overall health, lifestyle risk factors, and family history of disease. Counseling  Your health care provider may ask you questions about your: Alcohol use. Tobacco use. Drug use. Emotional well-being. Home and relationship well-being. Sexual activity. Eating habits. History of falls. Memory and ability to understand (cognition). Work and work Astronomer. Screening  You may have the following tests or  measurements: Height, weight, and BMI. Blood pressure. Lipid and cholesterol levels. These may be checked every 5 years, or more frequently if you are over 89 years old. Skin check. Lung cancer screening. You may have this screening every year starting at age 31 if you have a 30-pack-year history of smoking and currently smoke or have quit within the past 15 years. Fecal occult blood test (FOBT) of the stool. You may have this test every year starting at age 41. Flexible sigmoidoscopy or colonoscopy. You may have a sigmoidoscopy every 5 years or a colonoscopy every 10 years starting at age 22. Prostate cancer screening. Recommendations will vary depending on your family history and other risks. Hepatitis C blood test. Hepatitis B blood test. Sexually transmitted disease (STD) testing. Diabetes screening. This is done by checking your blood sugar (glucose) after you have not eaten for a while (fasting). You may have this done every 1-3 years. Abdominal aortic aneurysm (AAA) screening. You may need this if you are a current or former smoker. Osteoporosis. You may be screened starting at age 38 if you are at high risk. Talk with your health care provider about your test results, treatment options, and if necessary, the need for more tests. Vaccines  Your health care provider may recommend certain vaccines, such as: Influenza vaccine. This is recommended every year. Tetanus, diphtheria, and acellular pertussis (Tdap, Td) vaccine. You may need a Td booster every 10 years. Zoster vaccine. You may need this after age 72. Pneumococcal 13-valent conjugate (PCV13) vaccine. One dose is recommended after age 16. Pneumococcal polysaccharide (PPSV23) vaccine. One dose is recommended after age 39. Talk to your health care provider about which screenings and vaccines you need and how often you need  them. This information is not intended to replace advice given to you by your health care provider. Make sure  you discuss any questions you have with your health care provider. Document Released: 11/27/2015 Document Revised: 07/20/2016 Document Reviewed: 09/01/2015 Elsevier Interactive Patient Education  2017 [employer].  Fall Prevention in the Home Falls can cause injuries. They can happen to people of all ages. There are many things you can do to make your home safe and to help prevent falls. What can I do on the outside of my home? Regularly fix the edges of walkways and driveways and fix any cracks. Remove anything that might make you trip as you walk through a door, such as a raised step or threshold. Trim any bushes or trees on the path to your home. Use bright outdoor lighting. Clear any walking paths of anything that might make someone trip, such as rocks or tools. Regularly check to see if handrails are loose or broken. Make sure that both sides of any steps have handrails. Any raised decks and porches should have guardrails on the edges. Have any leaves, snow, or ice cleared regularly. Use sand or salt on walking paths during winter. Clean up any spills in your garage right away. This includes oil or grease spills. What can I do in the bathroom? Use night lights. Install grab bars by the toilet and in the tub and shower. Do not use towel bars as grab bars. Use non-skid mats or decals in the tub or shower. If you need to sit down in the shower, use a plastic, non-slip stool. Keep the floor dry. Clean up any water that spills on the floor as soon as it happens. Remove soap buildup in the tub or shower regularly. Attach bath mats securely with double-sided non-slip rug tape. Do not have throw rugs and other things on the floor that can make you trip. What can I do in the bedroom? Use night lights. Make sure that you have a light by your bed that is easy to reach. Do not use any sheets or blankets that are too big for your bed. They should not hang down onto the floor. Have a firm  chair that has side arms. You can use this for support while you get dressed. Do not have throw rugs and other things on the floor that can make you trip. What can I do in the kitchen? Clean up any spills right away. Avoid walking on wet floors. Keep items that you use a lot in easy-to-reach places. If you need to reach something above you, use a strong step stool that has a grab bar. Keep electrical cords out of the way. Do not use floor polish or wax that makes floors slippery. If you must use wax, use non-skid floor wax. Do not have throw rugs and other things on the floor that can make you trip. What can I do with my stairs? Do not leave any items on the stairs. Make sure that there are handrails on both sides of the stairs and use them. Fix handrails that are broken or loose. Make sure that handrails are as long as the stairways. Check any carpeting to make sure that it is firmly attached to the stairs. Fix any carpet that is loose or worn. Avoid having throw rugs at the top or bottom of the stairs. If you do have throw rugs, attach them to the floor with carpet tape. Make sure that you have a light switch at  the top of the stairs and the bottom of the stairs. If you do not have them, ask someone to add them for you. What else can I do to help prevent falls? Wear shoes that: Do not have high heels. Have rubber bottoms. Are comfortable and fit you well. Are closed at the toe. Do not wear sandals. If you use a stepladder: Make sure that it is fully opened. Do not climb a closed stepladder. Make sure that both sides of the stepladder are locked into place. Ask someone to hold it for you, if possible. Clearly mark and make sure that you can see: Any grab bars or handrails. First and last steps. Where the edge of each step is. Use tools that help you move around (mobility aids) if they are needed. These include: Canes. Walkers. Scooters. Crutches. Turn on the lights when you go  into a dark area. Replace any light bulbs as soon as they burn out. Set up your furniture so you have a clear path. Avoid moving your furniture around. If any of your floors are uneven, fix them. If there are any pets around you, be aware of where they are. Review your medicines with your doctor. Some medicines can make you feel dizzy. This can increase your chance of falling. Ask your doctor what other things that you can do to help prevent falls. This information is not intended to replace advice given to you by your health care provider. Make sure you discuss any questions you have with your health care provider. Document Released: 08/27/2009 Document Revised: 04/07/2016 Document Reviewed: 12/05/2014 Elsevier Interactive Patient Education  2017 [employer].

## 2023-06-29 ENCOUNTER — Encounter (INDEPENDENT_AMBULATORY_CARE_PROVIDER_SITE_OTHER): Payer: Self-pay

## 2023-08-04 ENCOUNTER — Ambulatory Visit (INDEPENDENT_AMBULATORY_CARE_PROVIDER_SITE_OTHER): Payer: Medicare HMO | Admitting: Family Medicine

## 2023-08-04 ENCOUNTER — Encounter: Payer: Self-pay | Admitting: Family Medicine

## 2023-08-04 VITALS — BP 130/72 | HR 58 | Temp 97.9°F | Ht 66.0 in | Wt 211.8 lb

## 2023-08-04 DIAGNOSIS — Z23 Encounter for immunization: Secondary | ICD-10-CM | POA: Diagnosis not present

## 2023-08-04 DIAGNOSIS — E039 Hypothyroidism, unspecified: Secondary | ICD-10-CM

## 2023-08-04 DIAGNOSIS — Z125 Encounter for screening for malignant neoplasm of prostate: Secondary | ICD-10-CM

## 2023-08-04 DIAGNOSIS — E7800 Pure hypercholesterolemia, unspecified: Secondary | ICD-10-CM | POA: Diagnosis not present

## 2023-08-04 DIAGNOSIS — Z0000 Encounter for general adult medical examination without abnormal findings: Secondary | ICD-10-CM

## 2023-08-04 DIAGNOSIS — I10 Essential (primary) hypertension: Secondary | ICD-10-CM

## 2023-08-04 LAB — LIPID PANEL
Cholesterol: 152 mg/dL (ref 0–200)
HDL: 42 mg/dL (ref >39.00)
LDL Cholesterol: 91 mg/dL (ref 0–99)
NonHDL: 110.1
Total CHOL/HDL Ratio: 4
Triglycerides: 97 mg/dL (ref 0.0–149.0)
VLDL: 19.4 mg/dL (ref 0.0–40.0)

## 2023-08-04 LAB — COMPREHENSIVE METABOLIC PANEL
ALT: 15 U/L (ref 0–53)
AST: 18 U/L (ref 0–37)
Albumin: 4.2 g/dL (ref 3.5–5.2)
Alkaline Phosphatase: 38 U/L — ABNORMAL LOW (ref 39–117)
BUN: 18 mg/dL (ref 6–23)
CO2: 32 mEq/L (ref 19–32)
Calcium: 9.7 mg/dL (ref 8.4–10.5)
Chloride: 99 mEq/L (ref 96–112)
Creatinine, Ser: 0.88 mg/dL (ref 0.40–1.50)
GFR: 82.86 mL/min (ref >60.00)
Glucose, Bld: 102 mg/dL — ABNORMAL HIGH (ref 70–99)
Potassium: 3.7 mEq/L (ref 3.5–5.1)
Sodium: 140 mEq/L (ref 135–145)
Total Bilirubin: 0.8 mg/dL (ref 0.2–1.2)
Total Protein: 6.9 g/dL (ref 6.0–8.3)

## 2023-08-04 LAB — PSA, MEDICARE: PSA: 0.72 ng/ml (ref 0.10–4.00)

## 2023-08-04 LAB — TSH: TSH: 4.4 u[IU]/mL (ref 0.35–5.50)

## 2023-08-04 MED ORDER — LOSARTAN POTASSIUM-HCTZ 100-25 MG PO TABS: 1.0000 | ORAL_TABLET | Freq: Every day | ORAL | 3 refills | Status: DC

## 2023-08-04 MED ORDER — LEVOTHYROXINE SODIUM 100 MCG PO TABS: 100.0000 ug | ORAL_TABLET | Freq: Every day | ORAL | 3 refills | Status: DC

## 2023-08-04 NOTE — Progress Notes (Addendum)
Subjective:  Patient ID: Terry Vega, male    DOB: July 10, 1946  Age: 77 y.o. MRN: [id number]  CC:  Chief Complaint  Patient presents with   Annual Exam    HPI PHILLIPPE RAYMO presents for Annual Exam PCP, [facility], Dr. Kary Kos Optometry Dr. Hyacinth Meeker  No new concerns.   Hypertension: Losartan HCTZ 100/25 mg daily, carvedilol 6.25 mg twice daily.  History of PVCs.  Prior echo with normal LV function, EF 60 to 65%.  Grade 1 diastolic dysfunction.  Prior stress testing without evidence of ischemia.  Prior pedal edema improved switching from diltiazem to carvedilol.  History of mild hyperlipidemia previously without use of statin. No side effects.  Home readings: 130/70 range.  BP Readings from Last 3 Encounters:  08/04/23 130/72  03/20/23 134/76  08/01/22 132/76   Lab Results  Component Value Date   CREATININE 1.03 08/01/2022   Hypothyroidism: Lab Results  Component Value Date   TSH 2.71 08/01/2022  Taking medication daily.  Synthroid 100 mcg daily. No new hot or cold intolerance. No new hair or skin changes, heart palpitations or new fatigue. No new weight changes. Intentional weight loss with goal of 200.          08/04/2023    7:52 AM 05/31/2023    9:37 AM 09/01/2022    2:44 PM 07/28/2021    8:25 AM 07/23/2020    8:12 AM  Depression screen PHQ 2/9  Decreased Interest 0 0 0 0 0  Down, Depressed, Hopeless 0 0 0 0 0  PHQ - 2 Score 0 0 0 0 0  Altered sleeping 0 0  0   Tired, decreased energy 1 0  0   Change in appetite 0 0  0   Feeling bad or failure about yourself  0 0  0   Trouble concentrating 0 0  0   Moving slowly or fidgety/restless 0 0  0   Suicidal thoughts 0 0  0   PHQ-9 Score 1 0  0   Difficult doing work/chores Not difficult at all Not difficult at all       Health Maintenance  Topic Date Due   COVID-19 Vaccine (5 - 2023-24 season) 11/14/2023 (Originally 07/16/2023)   Zoster Vaccines- Shingrix (1 of 2) 11/14/2023 (Originally 12/08/1995)    Medicare Annual Wellness (AWV)  05/30/2024   DTaP/Tdap/Td (3 - Tdap) 05/09/2029   Pneumonia Vaccine 43+ Years old  Completed   INFLUENZA VACCINE  Completed   Hepatitis C Screening  Completed   HPV VACCINES  Aged Out   Colonoscopy  Discontinued  Colonoscopy 05/06/2015, repeat 10 years Prostate: does not have family history of prostate cancer The natural history of prostate cancer and ongoing controversy regarding screening and potential treatment outcomes of prostate cancer has been discussed with the patient. The meaning of a false positive PSA and a false negative PSA has been discussed, as well as concerns with false positives, biopsy risks with increase as age increases, especially over age 24. He indicates understanding of the limitations of this screening test and wishes to proceed with screening PSA testing, understands risks of testing.  Lab Results  Component Value Date   PSA1 0.7 09/03/2020   PSA1 0.5 05/03/2019   PSA1 0.6 04/23/2018   PSA 0.61 08/01/2022   PSA 0.79 08/18/2021   PSA 0.57 03/17/2016      Immunization History  Administered Date(s) Administered   Fluad Quad(high Dose 65+) 07/23/2020, 07/28/2021, 08/01/2022   Fluad Trivalent(High  Dose 65+) 08/04/2023   Influenza Split 08/13/2014   Influenza, High Dose Seasonal PF 08/29/2017, 08/23/2018, 08/14/2019   Influenza,inj,Quad PF,6+ Mos 10/09/2013   Influenza-Unspecified 08/24/2016, 08/29/2018   Moderna Sars-Covid-2 Vaccination 12/19/2019, 01/14/2020, 09/29/2020   PFIZER(Purple Top)SARS-COV-2 Vaccination 08/29/2022   Pneumococcal Conjugate-13 09/11/2014   Pneumococcal Polysaccharide-23 04/20/2017   Td 12/23/2008, 05/10/2019   Zoster, Live 04/03/2016, 07/13/2016  Flu vax today.  Recommended covid booster at pharmacy RSV vaccine - discussed at pharmacy.    No results found. Regular optho follow up. Dr. Hyacinth Meeker, presbyopia.  Dental: every 6 mo - appt end of month.   Alcohol:rare beer - 1 per week.   Tobacco:  none  Exercise: 2 days or more per week, work at [employer], [employer]. Staying active.  Body mass index is 34.19 kg/m. Wt Readings from Last 3 Encounters:  08/04/23 211 lb 12.8 oz (96.1 kg)  09/01/22 218 lb (98.9 kg)  08/01/22 215 lb (97.5 kg)     History Patient Active Problem List   Diagnosis Date Noted   Leg edema 05/06/2014   PVC (premature ventricular contraction) 10/15/2013   Hypertension 10/08/2012   Hypothyroid 10/08/2012   Past Medical History:  Diagnosis Date   Allergy    Arthritis    [employer]rees[employer] 07/21/2020   Heart murmur    Hypertension    Thyroid disease    Past Surgical History:  Procedure Laterality Date   COLONOSCOPY     HEMORRHOID SURGERY     VASECTOMY     No Known Allergies Prior to Admission medications   Medication Sig Start Date End Date Taking? Authorizing Provider  aspirin 81 MG tablet Take 81 mg by mouth daily.   Yes [provider]  carvedilol (COREG) 6.25 MG tablet Take 1 tablet (6.25 mg total) by mouth 2 (two) times daily. 08/01/22  Yes Shade Flood, MD  cholecalciferol (VITAMIN D) 1000 UNITS tablet Take 1,000 Units by mouth daily.   Yes [provider]  Cyanocobalamin (VITAMIN B12 PO) Take 1,000 mg by mouth 2 (two) times daily.    Yes [provider]  levothyroxine (SYNTHROID) 100 MCG tablet Take 1 tablet (100 mcg total) by mouth daily. 08/01/22  Yes Shade Flood, MD  losartan-hydrochlorothiazide (HYZAAR) 100-25 MG tablet Take 1 tablet by mouth daily. 08/01/22  Yes Shade Flood, MD  Multiple Vitamin (MULTIVITAMIN) tablet Take 1 tablet by mouth daily.   Yes [provider]  Naproxen Sodium (ALEVE PO) Take by mouth as needed.   Yes [provider]  triamcinolone cream (KENALOG) 0.1 % APPLY 1 APPLICATION TOPICALLY 2 TIMES DAILY. 12/27/22  Yes Shade Flood, MD  vitamin E 1000 UNIT capsule Take 1,000 Units by mouth daily.   Yes [provider]   Social History   Socioeconomic  History   Marital status: Married    Spouse name: Not on file   Number of children: Not on file   Years of education: Not on file   Highest education level: Not on file  Occupational History   Not on file  Tobacco Use   Smoking status: Never   Smokeless tobacco: Never  Vaping Use   Vaping status: Never Used  Substance and Sexual Activity   Alcohol use: Yes    Comment: occasionally   Drug use: No   Sexual activity: Yes    Birth control/protection: None  Other Topics Concern   Not on file  Social History Narrative   Not on file   Social Determinants of  Health   Financial Resource Strain: Low Risk  (05/31/2023)   Overall Financial Resource Strain (CARDIA)    Difficulty of Paying Living Expenses: Not hard at all  Food Insecurity: No Food Insecurity (05/31/2023)   Hunger Vital Sign    Worried About Running Out of Food in the Last Year: Never true    Ran Out of Food in the Last Year: Never true  Transportation Needs: No Transportation Needs (05/31/2023)   PRAPARE - Administrator, Civil Service (Medical): No    Lack of Transportation (Non-Medical): No  Physical Activity: Inactive (05/31/2023)   Exercise Vital Sign    Days of Exercise per Week: 0 days    Minutes of Exercise per Session: 0 min  Stress: No Stress Concern Present (05/31/2023)   [employer] of Occupational Health - Occupational Stress Questionnaire    Feeling of Stress : Not at all  Social Connections: Socially Integrated (05/31/2023)   Social Connection and Isolation Panel [NHANES]    Frequency of Communication with Friends and Family: More than three times a week    Frequency of Social Gatherings with Friends and Family: Once a week    Attends Religious Services: More than 4 times per year    Active Member of [employer] or Organizations: Yes    Attends Banker Meetings: More than 4 times per year    Marital Status: Married  Catering manager Violence: Not At Risk (05/31/2023)    Humiliation, Afraid, Rape, and Kick questionnaire    Fear of Current or Ex-Partner: No    Emotionally Abused: No    Physically Abused: No    Sexually Abused: No    Review of Systems  13 point review of systems per patient health survey noted.  Negative other than as indicated above or in HPI.   Objective:   Vitals:   08/04/23 0749  BP: 130/72  Pulse: (!) 58  Temp: 97.9 F (36.6 C)  TempSrc: Temporal  SpO2: 95%  Weight: 211 lb 12.8 oz (96.1 kg)  Height: 5\' 6"  (1.676 m)     Physical Exam Vitals reviewed.  Constitutional:      Appearance: He is well-developed.  HENT:     Head: Normocephalic and atraumatic.     Right Ear: External ear normal.     Left Ear: External ear normal.  Eyes:     Conjunctiva/sclera: Conjunctivae normal.     Pupils: Pupils are equal, round, and reactive to light.  Neck:     Thyroid: No thyromegaly.  Cardiovascular:     Rate and Rhythm: Normal rate and regular rhythm.     Heart sounds: Normal heart sounds.  Pulmonary:     Effort: Pulmonary effort is normal. No respiratory distress.     Breath sounds: Normal breath sounds. No wheezing.  Abdominal:     General: There is no distension.     Palpations: Abdomen is soft.     Tenderness: There is no abdominal tenderness.  Musculoskeletal:        General: No tenderness. Normal range of motion.     Cervical back: Normal range of motion and neck supple.  Lymphadenopathy:     Cervical: No cervical adenopathy.  Skin:    General: Skin is warm and dry.  Neurological:     Mental Status: He is alert and oriented to person, place, and time.     Deep Tendon Reflexes: Reflexes are normal and symmetric.  Psychiatric:        Behavior:  Behavior normal.        Assessment & Plan:  FRANKIE GOUDY is a 77 y.o. male . Annual physical exam  - -anticipatory guidance as below in AVS, screening labs above. Health maintenance items as above in HPI discussed/recommended as applicable.   Hypothyroidism,  unspecified type - Plan: levothyroxine (SYNTHROID) 100 MCG tablet  -Tolerating current regimen, check labs, adjust regimen accordingly.  Benign essential HTN - Plan: losartan-hydrochlorothiazide (HYZAAR) 100-25 MG tablet, Comprehensive metabolic panel, Lipid panel  -Stable, continue same dose losartan HCTZ, check labs.  Need for influenza vaccination - Plan: Flu Vaccine Trivalent High Dose (Fluad)  Screening for prostate cancer - Plan: PSA, Medicare ( [place] only)  -Risks of testing discussed but he would like to proceed with PSA again, labs ordered.  Elevated LDL cholesterol level - Plan: Lipid panel  -Borderline and improved in last year, anticipate improvement with weight loss.  No meds for now, check labs as above.  Meds ordered this encounter  Medications   levothyroxine (SYNTHROID) 100 MCG tablet    Sig: Take 1 tablet (100 mcg total) by mouth daily.    Dispense:  90 tablet    Refill:  3   losartan-hydrochlorothiazide (HYZAAR) 100-25 MG tablet    Sig: Take 1 tablet by mouth daily.    Dispense:  90 tablet    Refill:  3   Patient Instructions  Thanks for coming in today.  No change in medications at this time.  Continue to watch diet, and stay active to help with managing weight.  Good work on the weight loss since last year.  If any concerns on labs I will let you know.  I do recommend the COVID booster and RSV vaccine at your pharmacy, see information below on RSV vaccine.  Let me know if there are questions and take care!  There is a recommendation for RSV vaccine for patients over age 20.   Typically would recommend the RSV vaccine as most important for patients over age 56 that have comorbidities that put them at increased risk for severe disease (heart disease such as congestive heart failure, coronary artery disease, lung disease such as asthma or COPD, kidney disease, liver disease, diabetes, chronic or progressive neurologic or muscular conditions, immunosuppressed,  or being frail or of advanced age).  For others that do not have these risk factors, there still is some benefit from vaccination since age is one of the main risk factors for developing severe disease however baseline risk of developing severe disease and requiring hospitalization is likely to be lower compared to those that have comorbidities in addition to age.   CDC does have some information as well: [employer] Preventive Care 60 Years and Older, Male Preventive care refers to lifestyle choices and visits with your health care provider that can promote health and wellness. Preventive care visits are also called wellness exams. What can I expect for my preventive care visit? Counseling During your preventive care visit, your health care provider may ask about your: Medical history, including: Past medical problems. Family medical history. History of falls. Current health, including: Emotional well-being. Home life and relationship well-being. Sexual activity. Memory and ability to understand (cognition). Lifestyle, including: Alcohol, nicotine or tobacco, and drug use. Access to firearms. Diet, exercise, and sleep habits. Work and work Astronomer. Sunscreen use. Safety issues such as seatbelt and bike helmet use. Physical exam Your health care provider will check your: Height and weight. These may be used to calculate your BMI (  body mass index). BMI is a measurement that tells if you are at a healthy weight. Waist circumference. This measures the distance around your waistline. This measurement also tells if you are at a healthy weight and may help predict your risk of certain diseases, such as type 2 diabetes and high blood pressure. Heart rate and blood pressure. Body temperature. Skin for abnormal spots. What immunizations do I need?  Vaccines are usually given at various ages, according to a schedule. Your health care  provider will recommend vaccines for you based on your age, medical history, and lifestyle or other factors, such as travel or where you work. What tests do I need? Screening Your health care provider may recommend screening tests for certain conditions. This may include: Lipid and cholesterol levels. Diabetes screening. This is done by checking your blood sugar (glucose) after you have not eaten for a while (fasting). Hepatitis C test. Hepatitis B test. HIV (human immunodeficiency virus) test. STI (sexually transmitted infection) testing, if you are at risk. Lung cancer screening. Colorectal cancer screening. Prostate cancer screening. Abdominal aortic aneurysm (AAA) screening. You may need this if you are a current or former smoker. Talk with your health care provider about your test results, treatment options, and if necessary, the need for more tests. Follow these instructions at home: Eating and drinking  Eat a diet that includes fresh fruits and vegetables, whole grains, lean protein, and low-fat dairy products. Limit your intake of foods with high amounts of sugar, saturated fats, and salt. Take vitamin and mineral supplements as recommended by your health care provider. Do not drink alcohol if your health care provider tells you not to drink. If you drink alcohol: Limit how much you have to 0-2 drinks a day. Know how much alcohol is in your drink. In the [place] one drink equals one 12 oz bottle of beer (355 mL), one 5 oz glass of wine (148 mL), or one 1 oz glass of hard liquor (44 mL). Lifestyle Brush your teeth every morning and night with fluoride toothpaste. Floss one time each day. Exercise for at least 30 minutes 5 or more days each week. Do not use any products that contain nicotine or tobacco. These products include cigarettes, chewing tobacco, and vaping devices, such as e-cigarettes. If you need help quitting, ask your health care provider. Do not use drugs. If you are  sexually active, practice safe sex. Use a condom or other form of protection to prevent STIs. Take aspirin only as told by your health care provider. Make sure that you understand how much to take and what form to take. Work with your health care provider to find out whether it is safe and beneficial for you to take aspirin daily. Ask your health care provider if you need to take a cholesterol-lowering medicine (statin). Find healthy ways to manage stress, such as: Meditation, yoga, or listening to music. Journaling. Talking to a trusted person. Spending time with friends and family. Safety Always wear your seat belt while driving or riding in a vehicle. Do not drive: If you have been drinking alcohol. Do not ride with someone who has been drinking. When you are tired or distracted. While texting. If you have been using any mind-altering substances or drugs. Wear a helmet and other protective equipment during sports activities. If you have firearms in your house, make sure you follow all gun safety procedures. Minimize exposure to UV radiation to reduce your risk of skin cancer. What's next? Visit your  health care provider once a year for an annual wellness visit. Ask your health care provider how often you should have your eyes and teeth checked. Stay up to date on all vaccines. This information is not intended to replace advice given to you by your health care provider. Make sure you discuss any questions you have with your health care provider. Document Revised: 04/28/2021 Document Reviewed: 04/28/2021 Elsevier Patient Education  2024 Elsevier Inc.     Signed,   Meredith Staggers, MD [facility], [facility] 08/04/23 8:25 AM

## 2023-08-04 NOTE — Patient Instructions (Addendum)
Thanks for coming in today.  No change in medications at this time.  Continue to watch diet, and stay active to help with managing weight.  Good work on the weight loss since last year.  If any concerns on labs I will let you know.  I do recommend the COVID booster and RSV vaccine at your pharmacy, see information below on RSV vaccine.  Let me know if there are questions and take care!  There is a recommendation for RSV vaccine for patients over age 77.   Typically would recommend the RSV vaccine as most important for patients over age 64 that have comorbidities that put them at increased risk for severe disease (heart disease such as congestive heart failure, coronary artery disease, lung disease such as asthma or COPD, kidney disease, liver disease, diabetes, chronic or progressive neurologic or muscular conditions, immunosuppressed, or being frail or of advanced age).  For others that do not have these risk factors, there still is some benefit from vaccination since age is one of the main risk factors for developing severe disease however baseline risk of developing severe disease and requiring hospitalization is likely to be lower compared to those that have comorbidities in addition to age.   CDC does have some information as well: [employer] Preventive Care 62 Years and Older, Male Preventive care refers to lifestyle choices and visits with your health care provider that can promote health and wellness. Preventive care visits are also called wellness exams. What can I expect for my preventive care visit? Counseling During your preventive care visit, your health care provider may ask about your: Medical history, including: Past medical problems. Family medical history. History of falls. Current health, including: Emotional well-being. Home life and relationship well-being. Sexual activity. Memory and ability to understand  (cognition). Lifestyle, including: Alcohol, nicotine or tobacco, and drug use. Access to firearms. Diet, exercise, and sleep habits. Work and work Astronomer. Sunscreen use. Safety issues such as seatbelt and bike helmet use. Physical exam Your health care provider will check your: Height and weight. These may be used to calculate your BMI (body mass index). BMI is a measurement that tells if you are at a healthy weight. Waist circumference. This measures the distance around your waistline. This measurement also tells if you are at a healthy weight and may help predict your risk of certain diseases, such as type 2 diabetes and high blood pressure. Heart rate and blood pressure. Body temperature. Skin for abnormal spots. What immunizations do I need?  Vaccines are usually given at various ages, according to a schedule. Your health care provider will recommend vaccines for you based on your age, medical history, and lifestyle or other factors, such as travel or where you work. What tests do I need? Screening Your health care provider may recommend screening tests for certain conditions. This may include: Lipid and cholesterol levels. Diabetes screening. This is done by checking your blood sugar (glucose) after you have not eaten for a while (fasting). Hepatitis C test. Hepatitis B test. HIV (human immunodeficiency virus) test. STI (sexually transmitted infection) testing, if you are at risk. Lung cancer screening. Colorectal cancer screening. Prostate cancer screening. Abdominal aortic aneurysm (AAA) screening. You may need this if you are a current or former smoker. Talk with your health care provider about your test results, treatment options, and if necessary, the need for more tests. Follow these instructions at home: Eating and drinking  Eat a diet that includes fresh fruits and vegetables, whole grains,  lean protein, and low-fat dairy products. Limit your intake of foods with  high amounts of sugar, saturated fats, and salt. Take vitamin and mineral supplements as recommended by your health care provider. Do not drink alcohol if your health care provider tells you not to drink. If you drink alcohol: Limit how much you have to 0-2 drinks a day. Know how much alcohol is in your drink. In the [place] one drink equals one 12 oz bottle of beer (355 mL), one 5 oz glass of wine (148 mL), or one 1 oz glass of hard liquor (44 mL). Lifestyle Brush your teeth every morning and night with fluoride toothpaste. Floss one time each day. Exercise for at least 30 minutes 5 or more days each week. Do not use any products that contain nicotine or tobacco. These products include cigarettes, chewing tobacco, and vaping devices, such as e-cigarettes. If you need help quitting, ask your health care provider. Do not use drugs. If you are sexually active, practice safe sex. Use a condom or other form of protection to prevent STIs. Take aspirin only as told by your health care provider. Make sure that you understand how much to take and what form to take. Work with your health care provider to find out whether it is safe and beneficial for you to take aspirin daily. Ask your health care provider if you need to take a cholesterol-lowering medicine (statin). Find healthy ways to manage stress, such as: Meditation, yoga, or listening to music. Journaling. Talking to a trusted person. Spending time with friends and family. Safety Always wear your seat belt while driving or riding in a vehicle. Do not drive: If you have been drinking alcohol. Do not ride with someone who has been drinking. When you are tired or distracted. While texting. If you have been using any mind-altering substances or drugs. Wear a helmet and other protective equipment during sports activities. If you have firearms in your house, make sure you follow all gun safety procedures. Minimize exposure to UV radiation to  reduce your risk of skin cancer. What's next? Visit your health care provider once a year for an annual wellness visit. Ask your health care provider how often you should have your eyes and teeth checked. Stay up to date on all vaccines. This information is not intended to replace advice given to you by your health care provider. Make sure you discuss any questions you have with your health care provider. Document Revised: 04/28/2021 Document Reviewed: 04/28/2021 Elsevier Patient Education  2024 [employer].

## 2023-08-22 ENCOUNTER — Other Ambulatory Visit: Payer: Self-pay | Admitting: Family Medicine

## 2023-08-22 DIAGNOSIS — I10 Essential (primary) hypertension: Secondary | ICD-10-CM

## 2023-09-15 DIAGNOSIS — H5203 Hypermetropia, bilateral: Secondary | ICD-10-CM | POA: Diagnosis not present

## 2023-10-17 ENCOUNTER — Ambulatory Visit: Payer: Medicare HMO | Attending: Cardiovascular Disease | Admitting: Cardiovascular Disease

## 2023-10-17 ENCOUNTER — Encounter: Payer: Self-pay | Admitting: Cardiovascular Disease

## 2023-10-17 VITALS — BP 124/72 | HR 58 | Ht 69.0 in | Wt 216.4 lb

## 2023-10-17 DIAGNOSIS — I493 Ventricular premature depolarization: Secondary | ICD-10-CM

## 2023-10-17 DIAGNOSIS — I10 Essential (primary) hypertension: Secondary | ICD-10-CM | POA: Diagnosis not present

## 2023-10-17 DIAGNOSIS — E785 Hyperlipidemia, unspecified: Secondary | ICD-10-CM

## 2023-10-17 MED ORDER — LOSARTAN POTASSIUM-HCTZ 100-25 MG PO TABS
1.0000 | ORAL_TABLET | Freq: Every day | ORAL | 3 refills | Status: DC
Start: 2023-10-17 — End: 2023-12-24

## 2023-10-17 MED ORDER — CARVEDILOL 6.25 MG PO TABS
6.2500 mg | ORAL_TABLET | Freq: Two times a day (BID) | ORAL | 3 refills | Status: DC
Start: 2023-10-17 — End: 2024-10-08

## 2023-10-17 NOTE — Progress Notes (Signed)
Cardiology Office Note   Date:  10/17/2023   ID:  Terry Vega, Terry Vega 09/11/1946, MRN [id number]  PCP:  Shade Flood, MD  Cardiologist:   Lorine Bears, MD   Chief Complaint  Patient presents with   Follow-up    OD 6 Month f/u no complaints today. Meds reviewed verbally with pt.      History of Present Illness: Terry Vega is a 77 y.o. male who presents for a followup visit regarding  PVCs and lower extremity edema. He has prolonged history of hypertension. He has hypothyroidism and has been on replacement therapy for many years.  He was seen in 2014 for unifocal PVCs. He had an echocardiogram done in December of 2014 which showed normal LV systolic function, mildly dilated left atrium and mild aortic regurgitation. Nuclear stress test showed no evidence of ischemia. He had worsening leg edema and diltiazem which improved after switching to carvedilol.  He also has history of posttussive syncope.  He has been doing extremely well with no recent chest pain, shortness of breath or palpitations.  His blood pressure has been well controlled.  No lower extremity edema.  Past Medical History:  Diagnosis Date   Allergy    Arthritis    Phreesia 07/21/2020   Heart murmur    Hypertension    Thyroid disease     Past Surgical History:  Procedure Laterality Date   COLONOSCOPY     HEMORRHOID SURGERY     VASECTOMY       Current Outpatient Medications  Medication Sig Dispense Refill   aspirin 81 MG tablet Take 81 mg by mouth daily.     carvedilol (COREG) 6.25 MG tablet TAKE 1 TABLET TWICE DAILY 180 tablet 3   cholecalciferol (VITAMIN D) 1000 UNITS tablet Take 1,000 Units by mouth daily.     Cyanocobalamin (VITAMIN B12 PO) Take 1,000 mg by mouth 2 (two) times daily.      levothyroxine (SYNTHROID) 100 MCG tablet Take 1 tablet (100 mcg total) by mouth daily. 90 tablet 3   losartan-hydrochlorothiazide (HYZAAR) 100-25 MG tablet Take 1 tablet by mouth daily. 90 tablet 3    Multiple Vitamin (MULTIVITAMIN) tablet Take 1 tablet by mouth daily.     Naproxen Sodium (ALEVE PO) Take by mouth as needed.     triamcinolone cream (KENALOG) 0.1 % APPLY 1 APPLICATION TOPICALLY 2 TIMES DAILY. 30 g 3   vitamin E 1000 UNIT capsule Take 1,000 Units by mouth daily.     No current facility-administered medications for this visit.    Allergies:   Patient has no known allergies.    Social History:  The patient  reports that he has never smoked. He has never used smokeless tobacco. He reports current alcohol use. He reports that he does not use drugs.   Family History:  The patient's family history includes Cancer in his father; Diabetes in his father and maternal grandmother; Heart disease in his mother; Stroke in his maternal grandmother.    ROS:  Please see the history of present illness.   Otherwise, review of systems are positive for none.   All other systems are reviewed and negative.    PHYSICAL EXAM: VS:  BP 124/72 (BP Location: Left Arm, Patient Position: Sitting, Cuff Size: Large)   Pulse (!) 58   Ht 5\' 9"  (1.753 m)   Wt 216 lb 6 oz (98.1 kg)   SpO2 98%   BMI 31.95 kg/m  , BMI Body mass index  is 31.95 kg/m. GEN: Well nourished, well developed, in no acute distress  HEENT: normal  Neck: no JVD, carotid bruits, or masses Cardiac: RRR; no murmurs, rubs, or gallops,no edema  Respiratory:  clear to auscultation bilaterally, normal work of breathing GI: soft, nontender, nondistended, + BS MS: no deformity or atrophy  Skin: warm and dry, no rash Neuro:  Strength and sensation are intact Psych: euthymic mood, full affect   EKG:  EKG is ordered today. The ekg ordered today demonstrates : Sinus bradycardia Minimal voltage criteria for LVH, may be normal variant ( R in aVL )       Recent Labs: 08/04/2023: ALT 15; BUN 18; Creatinine, Ser 0.88; Potassium 3.7; Sodium 140; TSH 4.40    Lipid Panel    Component Value Date/Time   CHOL 152 08/04/2023 0839    CHOL 162 01/23/2020 1342   TRIG 97.0 08/04/2023 0839   HDL 42.00 08/04/2023 0839   HDL 41 01/23/2020 1342   CHOLHDL 4 08/04/2023 0839   VLDL 19.4 08/04/2023 0839   LDLCALC 91 08/04/2023 0839   LDLCALC 102 (H) 01/23/2020 1342      Wt Readings from Last 3 Encounters:  10/17/23 216 lb 6 oz (98.1 kg)  08/04/23 211 lb 12.8 oz (96.1 kg)  09/01/22 218 lb (98.9 kg)         No data to display            ASSESSMENT AND PLAN:  1.  PVCs: No evidence of recurrent palpitations on carvedilol.  Carvedilol was refilled today.  2. Essential hypertension: Blood pressure is well controlled on current medications.  I reviewed most recent labs done in September which showed normal renal function.  I refilled Hyzaar.  3.  Mild hyperlipidemia: Most recent lipid profile done in September showed improvement in lipid profile with an LDL of 91.  He is not on any medications.   Disposition:   FU with me in 1 year  Signed,  Lorine Bears, MD  10/17/2023 1:43 PM    [facility]

## 2023-10-17 NOTE — Patient Instructions (Signed)
 Medication Instructions:  No changes *If you need a refill on your cardiac medications before your next appointment, please call your pharmacy*   Lab Work: None ordered If you have labs (blood work) drawn today and your tests are completely normal, you will receive your results only by: MyChart Message (if you have MyChart) OR A paper copy in the mail If you have any lab test that is abnormal or we need to change your treatment, we will call you to review the results.   Testing/Procedures: None ordered   Follow-Up: At [facility], you and your health needs are our priority.  As part of our continuing mission to provide you with exceptional heart care, we have created designated Provider Care Teams.  These Care Teams include your primary Cardiologist (physician) and Advanced Practice Providers (APPs -  Physician Assistants and Nurse Practitioners) who all work together to provide you with the care you need, when you need it.  We recommend signing up for the patient portal called "MyChart".  Sign up information is provided on this After Visit Summary.  MyChart is used to connect with patients for Virtual Visits (Telemedicine).  Patients are able to view lab/test results, encounter notes, upcoming appointments, etc.  Non-urgent messages can be sent to your provider as well.   To learn more about what you can do with MyChart, go to [email or web address].    Your next appointment:   12 month(s)  Provider:   You may see Dr. Kirke Corin or one of the following Advanced Practice Providers on your designated Care Team:   Nicolasa Ducking, NP Eula Listen, PA-C Cadence Fransico Michael, PA-C Charlsie Quest, NP Carlos Levering, NP

## 2023-11-24 ENCOUNTER — Ambulatory Visit: Payer: Medicare HMO | Admitting: Cardiovascular Disease

## 2023-12-07 ENCOUNTER — Ambulatory Visit: Payer: Medicare HMO | Attending: Nurse Practitioner | Admitting: Nurse Practitioner

## 2023-12-07 ENCOUNTER — Encounter: Payer: Self-pay | Admitting: Nurse Practitioner

## 2023-12-07 VITALS — BP 132/70 | HR 65 | Ht 69.0 in | Wt 213.0 lb

## 2023-12-07 DIAGNOSIS — I493 Ventricular premature depolarization: Secondary | ICD-10-CM | POA: Diagnosis not present

## 2023-12-07 DIAGNOSIS — I200 Unstable angina: Secondary | ICD-10-CM | POA: Diagnosis not present

## 2023-12-07 DIAGNOSIS — I10 Essential (primary) hypertension: Secondary | ICD-10-CM

## 2023-12-07 DIAGNOSIS — E785 Hyperlipidemia, unspecified: Secondary | ICD-10-CM | POA: Diagnosis not present

## 2023-12-07 MED ORDER — NITROGLYCERIN 0.4 MG SL SUBL: 0.4000 mg | SUBLINGUAL_TABLET | SUBLINGUAL | 1 refills | Status: DC | PRN

## 2023-12-07 MED ORDER — ISOSORBIDE MONONITRATE ER 30 MG PO TB24: 30.0000 mg | ORAL_TABLET | Freq: Every day | ORAL | 3 refills | Status: DC

## 2023-12-07 NOTE — Patient Instructions (Signed)
Medication Instructions:  Take Nitroglycerin as needed for chest pain: Place one tablet under the tongue as needed every 5 minutes for chest pain. If you have to use three tablets with no relief, please call 911 or go to your closest Emergency Department.  START Isosorbide (Imdur) 30 mg once daily  *If you need a refill on your cardiac medications before your next appointment, please call your pharmacy*   Lab Work: Your provider would like for you to have the following labs today: CBC and BMET  If you have labs (blood work) drawn today and your tests are completely normal, you will receive your results only by: MyChart Message (if you have MyChart) OR A paper copy in the mail If you have any lab test that is abnormal or we need to change your treatment, we will call you to review the results.   Testing/Procedures: Your physician has requested that you have a cardiac catheterization. Cardiac catheterization is used to diagnose and/or treat various heart conditions. Doctors may recommend this procedure for a number of different reasons. The most common reason is to evaluate chest pain. Chest pain can be a symptom of coronary artery disease (CAD), and cardiac catheterization can show whether plaque is narrowing or blocking your heart's arteries. This procedure is also used to evaluate the valves, as well as measure the blood flow and oxygen levels in different parts of your heart. For further information please visit [email or web address] Please follow instruction sheet, as given.    Follow-Up: At [facility], you and your health needs are our priority.  As part of our continuing mission to provide you with exceptional heart care, we have created designated Provider Care Teams.  These Care Teams include your primary Cardiologist (physician) and Advanced Practice Providers (APPs -  Physician Assistants and Nurse Practitioners) who all work together to provide you with the care you need,  when you need it.  We recommend signing up for the patient portal called "MyChart".  Sign up information is provided on this After Visit Summary.  MyChart is used to connect with patients for Virtual Visits (Telemedicine).  Patients are able to view lab/test results, encounter notes, upcoming appointments, etc.  Non-urgent messages can be sent to your provider as well.   To learn more about what you can do with MyChart, go to [email or web address].    Your next appointment:   3 week(s)  Provider:   You may see Lorine Bears, MD or one of the following Advanced Practice Providers on your designated Care Team:   Nicolasa Ducking, NP  Other Instructions  Terry Vega A DEPT OF [place] AT [facility] [address] [name], [address] [place] [address] Dept: [phone] Loc: [phone]  Terry Vega  12/07/2023  You are scheduled for a Cardiac Catheterization on Friday, January 31 with Dr. Lorine Bears.  1. Please arrive at the [facility] Entrance of [facility], [address], [place] [address] at 7:30 AM (This is 1 hour(s) prior to your procedure time).  Proceed to the Check-In Desk directly inside the entrance.  Procedure Parking: Use the entrance off of the [address] side of the hospital. Turn right upon entering and follow the driveway to parking that is directly in front of the [facility] Vascular Center. There is no valet parking available at this entrance, however there is an awning directly in front of the Heart & Vascular Center for drop off/ pick  up for patients.  Special note: Every effort is made to have your procedure done on time. Please understand that emergencies sometimes delay scheduled procedures.  2. Diet: Do not eat solid foods after midnight.  The patient may have clear liquids until 5am upon the day of the procedure.  3. Labs: You will need to have blood drawn on 12/07/23.  4.  Medication instructions in preparation for your procedure: Nothing to hold  On the morning of your procedure, take your Aspirin 81 mg and any morning medicines NOT listed above.  You may use sips of water.  5. Plan to go home the same day, you will only stay overnight if medically necessary. 6. Bring a current list of your medications and current insurance cards. 7. You MUST have a responsible person to drive you home. 8. Someone MUST be with you the first 24 hours after you arrive home or your discharge will be delayed. 9. Please wear clothes that are easy to get on and off and wear slip-on shoes.  Thank you for allowing [place] to care for you!   -- [facility]

## 2023-12-07 NOTE — H&P (View-Only) (Signed)
 Office Visit    Patient Name: Terry Vega Date of Encounter: 12/07/2023  Primary Care Provider:  Shade Flood, MD Primary Cardiologist:  Lorine Bears, MD  Chief Complaint    78 y.o. male with a history of hypertension, PVCs, diastolic dysfunction, lower extremity edema, and hypothyroidism, who presents for follow-up due to unstable angina.  Past Medical History  Subjective   Past Medical History:  Diagnosis Date   Allergy    Arthritis    Phreesia 07/21/2020   Diastolic dysfunction    a. 07/2021 Echo: EF 60-65%.  Mild LVH.  Grade 1 DD.  Normal RV fxn. mildly dil LA. Triv MR/AI. Mild AoV sclerosis.   Heart murmur    Hypertension    Hypothyroidism    PVC's (premature ventricular contractions)    Past Surgical History:  Procedure Laterality Date   COLONOSCOPY     HEMORRHOID SURGERY     VASECTOMY      Allergies  No Known Allergies    History of Present Illness      78 y.o. y/o male with the above past medical history including hypertension, PVCs, diastolic dysfunction, lower extremity edema, and hypothyroidism.  He was previously diagnosed with PVCs in 2014.  Echocardiogram at that time showed normal LV function.  Stress testing showed no evidence of ischemia.  He was initially managed with diltiazem however, this resulted in lower extremity swelling and he was switched to carvedilol, which she has tolerated well.  A follow-up echocardiogram in September 2022 showed an EF of 60 to 65% with mild LVH, grade 1 diastolic dysfunction, mildly dilated LA, mild MR/AI, and mild aortic valve sclerosis.    Mr. Fieldhouse was recently seen in cardiology clinic in December 2024, at which time he was doing well.  Unfortunately, over the past 2 weeks, he has been having daily exertional substernal chest pressure and heaviness associated with dyspnea lasting 5 to 10 minutes, resolving with rest.  He had never had this prior to 2 weeks ago.  He notes that whereas he might have been  able to walk about 100 yards a few months ago, currently, he can only walk about 25 yards before experiencing symptoms.  He has not had any rest or nocturnal symptoms.  He denies palpitations, PND, orthopnea, dizziness, syncope, edema, or early satiety. Objective  Home Medications    Current Outpatient Medications  Medication Sig Dispense Refill   aspirin 81 MG tablet Take 81 mg by mouth daily.     carvedilol (COREG) 6.25 MG tablet Take 1 tablet (6.25 mg total) by mouth 2 (two) times daily. 180 tablet 3   cholecalciferol (VITAMIN D) 1000 UNITS tablet Take 1,000 Units by mouth daily.     Cyanocobalamin (VITAMIN B12 PO) Take 1,000 mg by mouth 2 (two) times daily.      isosorbide mononitrate (IMDUR) 30 MG 24 hr tablet Take 1 tablet (30 mg total) by mouth daily. 90 tablet 3   levothyroxine (SYNTHROID) 100 MCG tablet Take 1 tablet (100 mcg total) by mouth daily. 90 tablet 3   losartan-hydrochlorothiazide (HYZAAR) 100-25 MG tablet Take 1 tablet by mouth daily. 90 tablet 3   Multiple Vitamin (MULTIVITAMIN) tablet Take 1 tablet by mouth daily.     Naproxen Sodium (ALEVE PO) Take by mouth as needed.     nitroGLYCERIN (NITROSTAT) 0.4 MG SL tablet Place 1 tablet (0.4 mg total) under the tongue every 5 (five) minutes as needed for chest pain. 25 tablet 1   triamcinolone  cream (KENALOG) 0.1 % APPLY 1 APPLICATION TOPICALLY 2 TIMES DAILY. 30 g 3   vitamin E 1000 UNIT capsule Take 1,000 Units by mouth daily.     No current facility-administered medications for this visit.    Family History    Family History  Problem Relation Age of Onset   Heart disease Mother    Cancer Father    Diabetes Father    Stroke Maternal Grandmother    Diabetes Maternal Grandmother    Colon cancer Neg Hx    Esophageal cancer Neg Hx    Rectal cancer Neg Hx    Stomach cancer Neg Hx      Social History    Social History   Socioeconomic History   Marital status: Married    Spouse name: Not on file   Number of  children: Not on file   Years of education: Not on file   Highest education level: Not on file  Occupational History   Not on file  Tobacco Use   Smoking status: Never   Smokeless tobacco: Never  Vaping Use   Vaping status: Never Used  Substance and Sexual Activity   Alcohol use: Yes    Comment: occasionally   Drug use: No   Sexual activity: Yes    Birth control/protection: None  Other Topics Concern   Not on file  Social History Narrative   Lives locally.  Active but does not routinely exercise.  Daughter works for our Financial risk analyst as Charity fundraiser.   Social Drivers of Corporate investment banker Strain: Low Risk  (05/31/2023)   Overall Financial Resource Strain (CARDIA)    Difficulty of Paying Living Expenses: Not hard at all  Food Insecurity: No Food Insecurity (05/31/2023)   Hunger Vital Sign    Worried About Running Out of Food in the Last Year: Never true    Ran Out of Food in the Last Year: Never true  Transportation Needs: No Transportation Needs (05/31/2023)   PRAPARE - Administrator, Civil Service (Medical): No    Lack of Transportation (Non-Medical): No  Physical Activity: Inactive (05/31/2023)   Exercise Vital Sign    Days of Exercise per Week: 0 days    Minutes of Exercise per Session: 0 min  Stress: No Stress Concern Present (05/31/2023)   [employer] of Occupational Health - Occupational Stress Questionnaire    Feeling of Stress : Not at all  Social Connections: Socially Integrated (05/31/2023)   Social Connection and Isolation Panel [NHANES]    Frequency of Communication with Friends and Family: More than three times a week    Frequency of Social Gatherings with Friends and Family: Once a week    Attends Religious Services: More than 4 times per year    Active Member of [employer] or Organizations: Yes    Attends Engineer, structural: More than 4 times per year    Marital Status: Married  Catering manager Violence: Not At Risk (05/31/2023)    Humiliation, Afraid, Rape, and Kick questionnaire    Fear of Current or Ex-Partner: No    Emotionally Abused: No    Physically Abused: No    Sexually Abused: No    Review of Systems    Exertional angina and dyspnea as outlined above.  He denies palpitations, PND, orthopnea, dizziness, syncope, nausea, vomiting, edema, early satiety, melena, or bright red blood per rectum. All other systems reviewed and negative.  Physical Exam    VS:  BP 132/70  Pulse 65   Ht 5\' 9"  (1.753 m)   Wt 213 lb (96.6 kg)   SpO2 98%   BMI 31.45 kg/m  , BMI Body mass index is 31.45 kg/m.     Vitals:   12/07/23 1424 12/07/23 1826  BP: (!) 153/79 132/70  Pulse: 65   SpO2: 98%       GEN: Well nourished, well developed, in no acute distress. HEENT: normal. Neck: Supple, no JVD, carotid bruits, or masses. Cardiac: RRR, no murmurs, rubs, or gallops. No clubbing, cyanosis, edema.  Radials 2+/PT 2+ and equal bilaterally.  Respiratory:  Respirations regular and unlabored, clear to auscultation bilaterally. GI: Soft, nontender, nondistended, BS + x 4. MS: no deformity or atrophy. Skin: warm and dry, no rash. Neuro:  Strength and sensation are intact. Psych: Normal affect.  Accessory Clinical Findings    ECG personally reviewed by me today - EKG Interpretation Date/Time:  Thursday December 07 2023 14:29:15 EST Ventricular Rate:  65 PR Interval:  182 QRS Duration:  110 QT Interval:  374 QTC Calculation: 388 R Axis:   -26  Text Interpretation: Normal sinus rhythm Normal ECG Confirmed by Nicolasa Ducking [id number]) on 12/07/2023 2:42:29 PM  - no acute changes.  Lab Results  Component Value Date   WBC 11.2 (H) 10/13/2017   HGB 12.3 (L) 10/13/2017   HCT 36.4 (L) 10/13/2017   MCV 93.3 10/13/2017   PLT 162 10/13/2017   Lab Results  Component Value Date   CREATININE 0.88 08/04/2023   BUN 18 08/04/2023   NA 140 08/04/2023   K 3.7 08/04/2023   CL 99 08/04/2023   CO2 32 08/04/2023   Lab Results   Component Value Date   ALT 15 08/04/2023   AST 18 08/04/2023   ALKPHOS 38 (L) 08/04/2023   BILITOT 0.8 08/04/2023   Lab Results  Component Value Date   CHOL 152 08/04/2023   HDL 42.00 08/04/2023   LDLCALC 91 08/04/2023   TRIG 97.0 08/04/2023   CHOLHDL 4 08/04/2023    Lab Results  Component Value Date   HGBA1C 5.3 07/28/2021   Lab Results  Component Value Date   TSH 4.40 08/04/2023       Assessment & Plan    1.  Unstable angina: Patient presents with a 2-week history of progressive exertional substernal chest pressure and tightness associated with dyspnea, lasting 5 to 10 minutes, and resolving with rest.  He has not had any rest or nocturnal symptoms.  We discussed options for evaluation today and mutually agreed that given high pretest probability for finding of obstructive coronary artery disease, proceeding with diagnostic catheterization is most appropriate at this time.  The patient understands that risks include but are not limited to stroke (1 in 1000), death (1 in 1000), kidney failure [usually temporary] (1 in 500), bleeding (1 in 200), allergic reaction [possibly serious] (1 in 200), and agrees to proceed.  Continue aspirin and beta-blocker therapy.  I am adding isosorbide mononitrate 30 mg daily as well as providing a prescription for sublingual nitroglycerin.  Previously statin nave with an LDL of 91.  2.  Primary hypertension: Blood pressure initially elevated but improved to 132/70 on repeat.  Continue beta-blocker and losartan-HCTZ.  Adding long-acting nitrate in the setting of angina.  3.  Mixed hyperlipidemia: LDL of 91 last year.  Plan for diagnostic catheterization as above.  Will require statin if coronary artery disease noted.  4.  PVCs: Quiescent on beta-blocker therapy.  5.  Disposition: Follow-up  CBC and basic metabolic panel today.  Plan for diagnostic catheterization next Friday with Dr. Kirke Corin.  Follow-up in clinic 2 weeks post  catheterization.  Nicolasa Ducking, NP 12/07/2023, 6:29 PM

## 2023-12-07 NOTE — Progress Notes (Signed)
Office Visit    Patient Name: Terry Vega Date of Encounter: 12/07/2023  Primary Care Provider:  Shade Flood, MD Primary Cardiologist:  Lorine Bears, MD  Chief Complaint    78 y.o. male with a history of hypertension, PVCs, diastolic dysfunction, lower extremity edema, and hypothyroidism, who presents for follow-up due to unstable angina.  Past Medical History  Subjective   Past Medical History:  Diagnosis Date   Allergy    Arthritis    [facility] 07/21/2020   Diastolic dysfunction    a. 07/2021 Echo: EF 60-65%.  Mild LVH.  Grade 1 DD.  Normal RV fxn. mildly dil LA. Triv MR/AI. Mild AoV sclerosis.   Heart murmur    Hypertension    Hypothyroidism    PVC's (premature ventricular contractions)    Past Surgical History:  Procedure Laterality Date   COLONOSCOPY     HEMORRHOID SURGERY     VASECTOMY      Allergies  No Known Allergies    History of Present Illness      78 y.o. y/o male with the above past medical history including hypertension, PVCs, diastolic dysfunction, lower extremity edema, and hypothyroidism.  He was previously diagnosed with PVCs in 2014.  Echocardiogram at that time showed normal LV function.  Stress testing showed no evidence of ischemia.  He was initially managed with diltiazem however, this resulted in lower extremity swelling and he was switched to carvedilol, which she has tolerated well.  A follow-up echocardiogram in September 2022 showed an EF of 60 to 65% with mild LVH, grade 1 diastolic dysfunction, mildly dilated LA, mild MR/AI, and mild aortic valve sclerosis.    Mr. Fieldhouse was recently seen in cardiology clinic in December 2024, at which time he was doing well.  Unfortunately, over the past 2 weeks, he has been having daily exertional substernal chest pressure and heaviness associated with dyspnea lasting 5 to 10 minutes, resolving with rest.  He had never had this prior to 2 weeks ago.  He notes that whereas he might have been  able to walk about 100 yards a few months ago, currently, he can only walk about 25 yards before experiencing symptoms.  He has not had any rest or nocturnal symptoms.  He denies palpitations, PND, orthopnea, dizziness, syncope, edema, or early satiety. Objective  Home Medications    Current Outpatient Medications  Medication Sig Dispense Refill   aspirin 81 MG tablet Take 81 mg by mouth daily.     carvedilol (COREG) 6.25 MG tablet Take 1 tablet (6.25 mg total) by mouth 2 (two) times daily. 180 tablet 3   cholecalciferol (VITAMIN D) 1000 UNITS tablet Take 1,000 Units by mouth daily.     Cyanocobalamin (VITAMIN B12 PO) Take 1,000 mg by mouth 2 (two) times daily.      isosorbide mononitrate (IMDUR) 30 MG 24 hr tablet Take 1 tablet (30 mg total) by mouth daily. 90 tablet 3   levothyroxine (SYNTHROID) 100 MCG tablet Take 1 tablet (100 mcg total) by mouth daily. 90 tablet 3   losartan-hydrochlorothiazide (HYZAAR) 100-25 MG tablet Take 1 tablet by mouth daily. 90 tablet 3   Multiple Vitamin (MULTIVITAMIN) tablet Take 1 tablet by mouth daily.     Naproxen Sodium (ALEVE PO) Take by mouth as needed.     nitroGLYCERIN (NITROSTAT) 0.4 MG SL tablet Place 1 tablet (0.4 mg total) under the tongue every 5 (five) minutes as needed for chest pain. 25 tablet 1   triamcinolone  cream (KENALOG) 0.1 % APPLY 1 APPLICATION TOPICALLY 2 TIMES DAILY. 30 g 3   vitamin E 1000 UNIT capsule Take 1,000 Units by mouth daily.     No current facility-administered medications for this visit.    Family History    Family History  Problem Relation Age of Onset   Heart disease Mother    Cancer Father    Diabetes Father    Stroke Maternal Grandmother    Diabetes Maternal Grandmother    Colon cancer Neg Hx    Esophageal cancer Neg Hx    Rectal cancer Neg Hx    Stomach cancer Neg Hx      Social History    Social History   Socioeconomic History   Marital status: Married    Spouse name: Not on file   Number of  children: Not on file   Years of education: Not on file   Highest education level: Not on file  Occupational History   Not on file  Tobacco Use   Smoking status: Never   Smokeless tobacco: Never  Vaping Use   Vaping status: Never Used  Substance and Sexual Activity   Alcohol use: Yes    Comment: occasionally   Drug use: No   Sexual activity: Yes    Birth control/protection: None  Other Topics Concern   Not on file  Social History Narrative   Lives locally.  Active but does not routinely exercise.  Daughter works for our Financial risk analyst as Charity fundraiser.   Social Drivers of Corporate investment banker Strain: Low Risk  (05/31/2023)   Overall Financial Resource Strain (CARDIA)    Difficulty of Paying Living Expenses: Not hard at all  Food Insecurity: No Food Insecurity (05/31/2023)   Hunger Vital Sign    Worried About Running Out of Food in the Last Year: Never true    Ran Out of Food in the Last Year: Never true  Transportation Needs: No Transportation Needs (05/31/2023)   PRAPARE - Administrator, Civil Service (Medical): No    Lack of Transportation (Non-Medical): No  Physical Activity: Inactive (05/31/2023)   Exercise Vital Sign    Days of Exercise per Week: 0 days    Minutes of Exercise per Session: 0 min  Stress: No Stress Concern Present (05/31/2023)   [employer] of Occupational Health - Occupational Stress Questionnaire    Feeling of Stress : Not at all  Social Connections: Socially Integrated (05/31/2023)   Social Connection and Isolation Panel [NHANES]    Frequency of Communication with Friends and Family: More than three times a week    Frequency of Social Gatherings with Friends and Family: Once a week    Attends Religious Services: More than 4 times per year    Active Member of [employer] or Organizations: Yes    Attends Engineer, structural: More than 4 times per year    Marital Status: Married  Catering manager Violence: Not At Risk (05/31/2023)    Humiliation, Afraid, Rape, and Kick questionnaire    Fear of Current or Ex-Partner: No    Emotionally Abused: No    Physically Abused: No    Sexually Abused: No    Review of Systems    Exertional angina and dyspnea as outlined above.  He denies palpitations, PND, orthopnea, dizziness, syncope, nausea, vomiting, edema, early satiety, melena, or bright red blood per rectum. All other systems reviewed and negative.  Physical Exam    VS:  BP 132/70  Pulse 65   Ht 5\' 9"  (1.753 m)   Wt 213 lb (96.6 kg)   SpO2 98%   BMI 31.45 kg/m  , BMI Body mass index is 31.45 kg/m.     Vitals:   12/07/23 1424 12/07/23 1826  BP: (!) 153/79 132/70  Pulse: 65   SpO2: 98%       GEN: Well nourished, well developed, in no acute distress. HEENT: normal. Neck: Supple, no JVD, carotid bruits, or masses. Cardiac: RRR, no murmurs, rubs, or gallops. No clubbing, cyanosis, edema.  Radials 2+/PT 2+ and equal bilaterally.  Respiratory:  Respirations regular and unlabored, clear to auscultation bilaterally. GI: Soft, nontender, nondistended, BS + x 4. MS: no deformity or atrophy. Skin: warm and dry, no rash. Neuro:  Strength and sensation are intact. Psych: Normal affect.  Accessory Clinical Findings    ECG personally reviewed by me today - EKG Interpretation Date/Time:  Thursday December 07 2023 14:29:15 EST Ventricular Rate:  65 PR Interval:  182 QRS Duration:  110 QT Interval:  374 QTC Calculation: 388 R Axis:   -26  Text Interpretation: Normal sinus rhythm Normal ECG Confirmed by Nicolasa Ducking [id number]) on 12/07/2023 2:42:29 PM  - no acute changes.  Lab Results  Component Value Date   WBC 11.2 (H) 10/13/2017   HGB 12.3 (L) 10/13/2017   HCT 36.4 (L) 10/13/2017   MCV 93.3 10/13/2017   PLT 162 10/13/2017   Lab Results  Component Value Date   CREATININE 0.88 08/04/2023   BUN 18 08/04/2023   NA 140 08/04/2023   K 3.7 08/04/2023   CL 99 08/04/2023   CO2 32 08/04/2023   Lab Results   Component Value Date   ALT 15 08/04/2023   AST 18 08/04/2023   ALKPHOS 38 (L) 08/04/2023   BILITOT 0.8 08/04/2023   Lab Results  Component Value Date   CHOL 152 08/04/2023   HDL 42.00 08/04/2023   LDLCALC 91 08/04/2023   TRIG 97.0 08/04/2023   CHOLHDL 4 08/04/2023    Lab Results  Component Value Date   HGBA1C 5.3 07/28/2021   Lab Results  Component Value Date   TSH 4.40 08/04/2023       Assessment & Plan    1.  Unstable angina: Patient presents with a 2-week history of progressive exertional substernal chest pressure and tightness associated with dyspnea, lasting 5 to 10 minutes, and resolving with rest.  He has not had any rest or nocturnal symptoms.  We discussed options for evaluation today and mutually agreed that given high pretest probability for finding of obstructive coronary artery disease, proceeding with diagnostic catheterization is most appropriate at this time.  The patient understands that risks include but are not limited to stroke (1 in 1000), death (1 in 1000), kidney failure [usually temporary] (1 in 500), bleeding (1 in 200), allergic reaction [possibly serious] (1 in 200), and agrees to proceed.  Continue aspirin and beta-blocker therapy.  I am adding isosorbide mononitrate 30 mg daily as well as providing a prescription for sublingual nitroglycerin.  Previously statin nave with an LDL of 91.  2.  Primary hypertension: Blood pressure initially elevated but improved to 132/70 on repeat.  Continue beta-blocker and losartan-HCTZ.  Adding long-acting nitrate in the setting of angina.  3.  Mixed hyperlipidemia: LDL of 91 last year.  Plan for diagnostic catheterization as above.  Will require statin if coronary artery disease noted.  4.  PVCs: Quiescent on beta-blocker therapy.  5.  Disposition: Follow-up  CBC and basic metabolic panel today.  Plan for diagnostic catheterization next Friday with Dr. Kirke Corin.  Follow-up in clinic 2 weeks post  catheterization.  Nicolasa Ducking, NP 12/07/2023, 6:29 PM

## 2023-12-08 LAB — BASIC METABOLIC PANEL
BUN/Creatinine Ratio: 14 (ref 10–24)
BUN: 13 mg/dL (ref 8–27)
CO2: 29 mmol/L (ref 20–29)
Calcium: 9.7 mg/dL (ref 8.6–10.2)
Chloride: 99 mmol/L (ref 96–106)
Creatinine, Ser: 0.96 mg/dL (ref 0.76–1.27)
Glucose: 89 mg/dL (ref 70–99)
Potassium: 4.1 mmol/L (ref 3.5–5.2)
Sodium: 141 mmol/L (ref 134–144)
eGFR: 81 mL/min/{1.73_m2} (ref >59)

## 2023-12-08 LAB — CBC
Hematocrit: 44.2 % (ref 37.5–51.0)
Hemoglobin: 14.9 g/dL (ref 13.0–17.7)
MCH: 30.8 pg (ref 26.6–33.0)
MCHC: 33.7 g/dL (ref 31.5–35.7)
MCV: 91 fL (ref 79–97)
Platelets: 183 10*3/uL (ref 150–450)
RBC: 4.84 x10E6/uL (ref 4.14–5.80)
RDW: 12 % (ref 11.6–15.4)
WBC: 7.1 10*3/uL (ref 3.4–10.8)

## 2023-12-15 ENCOUNTER — Inpatient Hospital Stay
Admission: AD | Admit: 2023-12-15 | Discharge: 2023-12-16 | DRG: 287 | Disposition: A | Discharge status: Other Acute Inpatient Hospital | Payer: Medicare HMO | Attending: Cardiovascular Disease | Admitting: Cardiovascular Disease

## 2023-12-15 ENCOUNTER — Encounter (HOSPITAL_COMMUNITY): Payer: Self-pay

## 2023-12-15 ENCOUNTER — Encounter: Payer: Self-pay | Admitting: Cardiovascular Disease

## 2023-12-15 ENCOUNTER — Other Ambulatory Visit: Payer: Self-pay

## 2023-12-15 ENCOUNTER — Encounter
Admission: AD | Disposition: A | Discharge status: Other Acute Inpatient Hospital | Payer: Self-pay | Source: Home / Self Care | Attending: Dermatopathology

## 2023-12-15 DIAGNOSIS — I340 Nonrheumatic mitral (valve) insufficiency: Secondary | ICD-10-CM | POA: Diagnosis not present

## 2023-12-15 DIAGNOSIS — Z79890 Hormone replacement therapy (postmenopausal): Secondary | ICD-10-CM | POA: Diagnosis not present

## 2023-12-15 DIAGNOSIS — Z7982 Long term (current) use of aspirin: Secondary | ICD-10-CM

## 2023-12-15 DIAGNOSIS — I2489 Other forms of acute ischemic heart disease: Secondary | ICD-10-CM | POA: Diagnosis not present

## 2023-12-15 DIAGNOSIS — Z79899 Other long term (current) drug therapy: Secondary | ICD-10-CM | POA: Diagnosis not present

## 2023-12-15 DIAGNOSIS — E782 Mixed hyperlipidemia: Secondary | ICD-10-CM | POA: Diagnosis not present

## 2023-12-15 DIAGNOSIS — Z8249 Family history of ischemic heart disease and other diseases of the circulatory system: Secondary | ICD-10-CM | POA: Diagnosis not present

## 2023-12-15 DIAGNOSIS — I510 Cardiac septal defect, acquired: Secondary | ICD-10-CM | POA: Diagnosis not present

## 2023-12-15 DIAGNOSIS — I200 Unstable angina: Secondary | ICD-10-CM | POA: Diagnosis present

## 2023-12-15 DIAGNOSIS — Z823 Family history of stroke: Secondary | ICD-10-CM | POA: Diagnosis not present

## 2023-12-15 DIAGNOSIS — Z833 Family history of diabetes mellitus: Secondary | ICD-10-CM

## 2023-12-15 DIAGNOSIS — I25110 Atherosclerotic heart disease of native coronary artery with unstable angina pectoris: Principal | ICD-10-CM | POA: Diagnosis present

## 2023-12-15 DIAGNOSIS — R079 Chest pain, unspecified: Secondary | ICD-10-CM

## 2023-12-15 DIAGNOSIS — I493 Ventricular premature depolarization: Secondary | ICD-10-CM | POA: Diagnosis present

## 2023-12-15 DIAGNOSIS — I10 Essential (primary) hypertension: Secondary | ICD-10-CM | POA: Diagnosis present

## 2023-12-15 DIAGNOSIS — E039 Hypothyroidism, unspecified: Secondary | ICD-10-CM | POA: Diagnosis present

## 2023-12-15 HISTORY — PX: LEFT HEART CATH AND CORONARY ANGIOGRAPHY: CATH118249

## 2023-12-15 LAB — CBC
HCT: 42.2 % (ref 39.0–52.0)
Hemoglobin: 14.4 g/dL (ref 13.0–17.0)
MCH: 31.2 pg (ref 26.0–34.0)
MCHC: 34.1 g/dL (ref 30.0–36.0)
MCV: 91.3 fL (ref 80.0–100.0)
Platelets: 174 10*3/uL (ref 150–400)
RBC: 4.62 MIL/uL (ref 4.22–5.81)
RDW: 12.6 % (ref 11.5–15.5)
WBC: 7.1 10*3/uL (ref 4.0–10.5)
nRBC: 0 % (ref 0.0–0.2)

## 2023-12-15 LAB — PROTIME-INR
INR: 1.1 (ref 0.8–1.2)
Prothrombin Time: 14.5 s (ref 11.4–15.2)

## 2023-12-15 LAB — APTT: aPTT: 40 s — ABNORMAL HIGH (ref 24–36)

## 2023-12-15 SURGERY — LEFT HEART CATH AND CORONARY ANGIOGRAPHY
Anesthesia: Moderate Sedation | Laterality: Left

## 2023-12-15 MED ORDER — ISOSORBIDE MONONITRATE ER 30 MG PO TB24
30.0000 mg | ORAL_TABLET | Freq: Every day | ORAL | Status: DC
  Administered 2023-12-16: 30 mg via ORAL
  Filled 2023-12-15: qty 1

## 2023-12-15 MED ORDER — HEPARIN (PORCINE) IN NACL 1000-0.9 UT/500ML-% IV SOLN
INTRAVENOUS | Status: AC
  Filled 2023-12-15: qty 1000

## 2023-12-15 MED ORDER — ONDANSETRON HCL 4 MG/2ML IJ SOLN: 4.0000 mg | Freq: Four times a day (QID) | INTRAMUSCULAR | Status: DC | PRN

## 2023-12-15 MED ORDER — ASPIRIN 81 MG PO CHEW: 81.0000 mg | CHEWABLE_TABLET | ORAL | Status: DC

## 2023-12-15 MED ORDER — SODIUM CHLORIDE 0.9% FLUSH
3.0000 mL | Freq: Two times a day (BID) | INTRAVENOUS | Status: DC
  Administered 2023-12-15 – 2023-12-16 (×2): 3 mL via INTRAVENOUS

## 2023-12-15 MED ORDER — SODIUM CHLORIDE 0.9 % IV SOLN: 250.0000 mL | INTRAVENOUS | Status: AC | PRN

## 2023-12-15 MED ORDER — HEPARIN (PORCINE) 25000 UT/250ML-% IV SOLN
1400.0000 [IU]/h | INTRAVENOUS | Status: DC
  Administered 2023-12-15: 1250 [IU]/h via INTRAVENOUS
  Administered 2023-12-16: 1400 [IU]/h via INTRAVENOUS
  Filled 2023-12-15: qty 250

## 2023-12-15 MED ORDER — ATORVASTATIN CALCIUM 40 MG PO TABS: 40.0000 mg | ORAL_TABLET | Freq: Every day | ORAL | 6 refills | Status: DC

## 2023-12-15 MED ORDER — SODIUM CHLORIDE 0.9 % WEIGHT BASED INFUSION: 1.0000 mL/kg/h | INTRAVENOUS | Status: DC

## 2023-12-15 MED ORDER — SODIUM CHLORIDE 0.9 % IV SOLN: INTRAVENOUS | Status: AC

## 2023-12-15 MED ORDER — FENTANYL CITRATE (PF) 100 MCG/2ML IJ SOLN
INTRAMUSCULAR | Status: AC
  Filled 2023-12-15: qty 2

## 2023-12-15 MED ORDER — IOHEXOL 300 MG/ML  SOLN
INTRAMUSCULAR | Status: DC | PRN
  Administered 2023-12-15: 55 mL

## 2023-12-15 MED ORDER — ACETAMINOPHEN 325 MG PO TABS
650.0000 mg | ORAL_TABLET | ORAL | Status: DC | PRN
  Administered 2023-12-16: 650 mg via ORAL
  Filled 2023-12-15: qty 2

## 2023-12-15 MED ORDER — HEPARIN (PORCINE) IN NACL 1000-0.9 UT/500ML-% IV SOLN
INTRAVENOUS | Status: DC | PRN
  Administered 2023-12-15 (×2): 500 mL

## 2023-12-15 MED ORDER — MIDAZOLAM HCL 2 MG/2ML IJ SOLN
INTRAMUSCULAR | Status: DC | PRN
  Administered 2023-12-15: 1 mg via INTRAVENOUS

## 2023-12-15 MED ORDER — HEPARIN SODIUM (PORCINE) 1000 UNIT/ML IJ SOLN
INTRAMUSCULAR | Status: DC | PRN
  Administered 2023-12-15: 4500 [IU] via INTRAVENOUS

## 2023-12-15 MED ORDER — SODIUM CHLORIDE 0.9% FLUSH: 3.0000 mL | INTRAVENOUS | Status: DC | PRN

## 2023-12-15 MED ORDER — ASPIRIN 81 MG PO TBEC
81.0000 mg | DELAYED_RELEASE_TABLET | Freq: Every day | ORAL | Status: DC
  Administered 2023-12-16: 81 mg via ORAL
  Filled 2023-12-15: qty 1

## 2023-12-15 MED ORDER — VERAPAMIL HCL 2.5 MG/ML IV SOLN
INTRAVENOUS | Status: DC | PRN
  Administered 2023-12-15: 2.5 mg via INTRAVENOUS

## 2023-12-15 MED ORDER — MIDAZOLAM HCL 2 MG/2ML IJ SOLN
INTRAMUSCULAR | Status: AC
  Filled 2023-12-15: qty 2

## 2023-12-15 MED ORDER — VERAPAMIL HCL 2.5 MG/ML IV SOLN
INTRAVENOUS | Status: AC
Start: 2023-12-15 — End: ?
  Filled 2023-12-15: qty 2

## 2023-12-15 MED ORDER — FENTANYL CITRATE (PF) 100 MCG/2ML IJ SOLN
INTRAMUSCULAR | Status: DC | PRN
  Administered 2023-12-15: 50 ug via INTRAVENOUS

## 2023-12-15 MED ORDER — SODIUM CHLORIDE 0.9 % WEIGHT BASED INFUSION
3.0000 mL/kg/h | INTRAVENOUS | Status: AC
  Administered 2023-12-15: 3 mL/kg/h via INTRAVENOUS

## 2023-12-15 MED ORDER — ATORVASTATIN CALCIUM 20 MG PO TABS
40.0000 mg | ORAL_TABLET | Freq: Every day | ORAL | Status: DC
  Administered 2023-12-15 – 2023-12-16 (×2): 40 mg via ORAL
  Filled 2023-12-15 (×2): qty 2

## 2023-12-15 MED ORDER — SODIUM CHLORIDE 0.9 % IV SOLN
INTRAVENOUS | Status: AC | PRN
  Administered 2023-12-15: 96 mL via INTRAVENOUS

## 2023-12-15 MED ORDER — HEPARIN (PORCINE) 25000 UT/250ML-% IV SOLN
INTRAVENOUS | Status: AC
  Filled 2023-12-15: qty 250

## 2023-12-15 MED ORDER — CARVEDILOL 6.25 MG PO TABS
6.2500 mg | ORAL_TABLET | Freq: Two times a day (BID) | ORAL | Status: DC
  Administered 2023-12-15 – 2023-12-16 (×3): 6.25 mg via ORAL
  Filled 2023-12-15 (×3): qty 1

## 2023-12-15 MED ORDER — NITROGLYCERIN 0.4 MG SL SUBL: 0.4000 mg | SUBLINGUAL_TABLET | SUBLINGUAL | Status: DC | PRN

## 2023-12-15 MED ORDER — LOSARTAN POTASSIUM 50 MG PO TABS
100.0000 mg | ORAL_TABLET | Freq: Every day | ORAL | Status: DC
  Administered 2023-12-16: 100 mg via ORAL
  Filled 2023-12-15 (×2): qty 2

## 2023-12-15 MED ORDER — LIDOCAINE HCL (PF) 1 % IJ SOLN
INTRAMUSCULAR | Status: DC | PRN
  Administered 2023-12-15: 2 mL

## 2023-12-15 MED ORDER — HEPARIN SODIUM (PORCINE) 1000 UNIT/ML IJ SOLN
INTRAMUSCULAR | Status: AC
  Filled 2023-12-15: qty 10

## 2023-12-15 SURGICAL SUPPLY — 10 items
CATH INFINITI AMBI 5FR JK (CATHETERS) IMPLANT
DEVICE RAD TR BAND REGULAR (VASCULAR PRODUCTS) IMPLANT
DRAPE BRACHIAL (DRAPES) IMPLANT
GLIDESHEATH SLEND SS 6F .021 (SHEATH) IMPLANT
GUIDEWIRE INQWIRE 1.5J.035X260 (WIRE) IMPLANT
INQWIRE 1.5J .035X260CM (WIRE) ×2
PACK CARDIAC CATH (CUSTOM PROCEDURE TRAY) ×2 IMPLANT
PROTECTION STATION PRESSURIZED (MISCELLANEOUS) ×1
SET ATX-X65L (MISCELLANEOUS) IMPLANT
STATION PROTECTION PRESSURIZED (MISCELLANEOUS) IMPLANT

## 2023-12-15 NOTE — Progress Notes (Signed)
Report given to Mar Daring, RN for hand-off.

## 2023-12-15 NOTE — Interval H&P Note (Signed)
History and Physical Interval Note:  12/15/2023 9:47 AM  Terry Vega  has presented today for surgery, with the diagnosis of L Cath   Chest pain.  The various methods of treatment have been discussed with the patient and family. After consideration of risks, benefits and other options for treatment, the patient has consented to  Procedure(s): LEFT HEART CATH AND CORONARY ANGIOGRAPHY (Left) as a surgical intervention.  The patient's history has been reviewed, patient examined, no change in status, stable for surgery.  I have reviewed the patient's chart and labs.  Questions were answered to the patient's satisfaction.     Lorine Bears

## 2023-12-15 NOTE — Consult Note (Signed)
Pharmacy Consult Note - Anticoagulation  Pharmacy Consult for heparin Indication: unstable angina, in need of CABG  PATIENT MEASUREMENTS: Height: 5\' 9"  (175.3 cm) Weight: 92.8 kg (204 lb 9.6 oz) IBW/kg (Calculated) : 70.7 HEPARIN DW (KG): 89.7  VITAL SIGNS: Temp: 98.7 F (37.1 C) (01/31 0800) Temp Source: Oral (01/31 0800) BP: 127/72 (01/31 1700) Pulse Rate: 58 (01/31 1700)  No results for input(s): "HGB", "HCT", "PLT", "APTT", "LABPROT", "INR", "HEPARINUNFRC", "HEPRLOWMOCWT", "CREATININE", "CKTOTAL", "CKMB", "TROPONINIHS" in the last 72 hours.  Estimated Creatinine Clearance: 71.3 mL/min (by C-G formula based on SCr of 0.96 mg/dL).  PAST MEDICAL HISTORY: Past Medical History:  Diagnosis Date   Allergy    Arthritis    [facility] 07/21/2020   Diastolic dysfunction    a. 07/2021 Echo: EF 60-65%.  Mild LVH.  Grade 1 DD.  Normal RV fxn. mildly dil LA. Triv MR/AI. Mild AoV sclerosis.   Heart murmur    Hypertension    Hypothyroidism    PVC's (premature ventricular contractions)     ASSESSMENT: 78 y.o. male with PMH including is presenting with unstable angina. Patient is not on chronic anticoagulation per chart review. He is in need of CABG but  will not be able to transfer to [facility] tonight. Patient is currently stable. Baseline labs have been ordered. In anticipation of that procedure, pharmacy has been consulted to initiate and manage heparin intravenous infusion.  Pertinent medications: No chronic anticoagulation PTA from chart review  Goal(s) of therapy: Heparin level 0.3 - 0.7 units/mL Monitor platelets by anticoagulation protocol: Yes      Date Time aPTT/HL Rate/Comment      PLAN: Start heparin infusion at 1250 units/hour. No bolus per MD Check heparin level in 8 hours, then daily once at least two levels are consecutively therapeutic. Monitor CBC daily while on heparin infusion.   Will M. [name], PharmD Clinical Pharmacist 12/15/2023 5:46 PM

## 2023-12-15 NOTE — H&P (Signed)
Cardiology Admission History and Physical   Patient ID: Terry Vega MRN: [id number]; DOB: 01/22/1946   Admission date: (Not on file)  PCP:  Shade Flood, MD    [facility] Cardiologist:  Lorine Bears, MD   {   Chief Complaint:  Unstable angina  Patient Profile:   Terry Vega is a 78 y.o. male with h/o HTN, PVCs, diastolic dysfunction, lower extremity edema and hypothyroidism who is being seen 12/15/2023 for the evaluation of unstable angina.  History of Present Illness:   Mr. Riegler was previously diagnosed with PVCs in 2014. Echo showed normal LVEF. Stress testing showed no evidence of ischemia. He was initially managed with diltiazem however, this resulted in lower extremity swelling and he was switched to Coreg. Follow-up echo 07/2021 showed EF 60-65%, mild LVH, G1DD, mildly dilated LA, mild MR/AI, and mild aortic valve sclerosis.   The patient was seen in the office January 2025 for progressive exertional chest pressure and dyspnea. The patient was set up for cardiac catheterization. LHC 12/15/23 showed moderately calcified arteries with severe 3V CAD including LAD diagonal bifurcation and left Cx OM bifurcation, normal LV systolic function. Given severity of disease, plan to transfer to [facility] for CABG evaluation.   Past Medical History:  Diagnosis Date   Allergy    Arthritis    Phreesia 07/21/2020   Diastolic dysfunction    a. 07/2021 Echo: EF 60-65%.  Mild LVH.  Grade 1 DD.  Normal RV fxn. mildly dil LA. Triv MR/AI. Mild AoV sclerosis.   Heart murmur    Hypertension    Hypothyroidism    PVC's (premature ventricular contractions)     Past Surgical History:  Procedure Laterality Date   COLONOSCOPY     HEMORRHOID SURGERY     VASECTOMY       Medications Prior to Admission: Prior to Admission medications   Medication Sig Start Date End Date Taking? Authorizing Provider  aspirin 81 MG tablet Take 81 mg by mouth daily.    [provider]  atorvastatin (LIPITOR) 40 MG tablet Take 1 tablet (40 mg total) by mouth daily. 12/15/23 12/14/24  Iran Ouch, MD  carvedilol (COREG) 6.25 MG tablet Take 1 tablet (6.25 mg total) by mouth 2 (two) times daily. 10/17/23   Iran Ouch, MD  cholecalciferol (VITAMIN D) 1000 UNITS tablet Take 1,000 Units by mouth daily.    [provider]  Cyanocobalamin (VITAMIN B12) 1000 MCG TBCR Take 1,000 mg by mouth 2 (two) times daily.    [provider]  isosorbide mononitrate (IMDUR) 30 MG 24 hr tablet Take 1 tablet (30 mg total) by mouth daily. 12/07/23 03/06/24  Creig Hines, NP  levothyroxine (SYNTHROID) 100 MCG tablet Take 1 tablet (100 mcg total) by mouth daily. 08/04/23   Shade Flood, MD  losartan-hydrochlorothiazide (HYZAAR) 100-25 MG tablet Take 1 tablet by mouth daily. 10/17/23   Iran Ouch, MD  Multiple Vitamin (MULTIVITAMIN) tablet Take 1 tablet by mouth daily.    [provider]  naproxen sodium (ALEVE) 220 MG tablet Take 220 mg by mouth daily as needed (pains).    [provider]  nitroGLYCERIN (NITROSTAT) 0.4 MG SL tablet Place 1 tablet (0.4 mg total) under the tongue every 5 (five) minutes as needed for chest pain. 12/07/23 03/06/24  Creig Hines, NP  PREVIDENT 5000 BOOSTER PLUS 1.1 % PSTE Place 1 application  onto teeth at bedtime. 08/14/23   [provider]  triamcinolone  cream (KENALOG) 0.1 % APPLY 1 APPLICATION TOPICALLY 2 TIMES DAILY. Patient taking differently: Apply 1 Application topically daily as needed (irritation). 12/27/22   Shade Flood, MD  vitamin E 1000 UNIT capsule Take 1,000 Units by mouth daily.    [provider]     Allergies:   No Known Allergies  Social History:   Social History   Socioeconomic History   Marital status: Married    Spouse name: Not on file   Number of children: Not on file   Years of education: Not on file   Highest education level: Not  on file  Occupational History   Not on file  Tobacco Use   Smoking status: Never   Smokeless tobacco: Never  Vaping Use   Vaping status: Never Used  Substance and Sexual Activity   Alcohol use: Yes    Comment: occasionally   Drug use: No   Sexual activity: Yes    Birth control/protection: None  Other Topics Concern   Not on file  Social History Narrative   Lives locally.  Active but does not routinely exercise.  Daughter works for our Financial risk analyst as Charity fundraiser.   Social Drivers of Corporate investment banker Strain: Low Risk  (05/31/2023)   Overall Financial Resource Strain (CARDIA)    Difficulty of Paying Living Expenses: Not hard at all  Food Insecurity: No Food Insecurity (05/31/2023)   Hunger Vital Sign    Worried About Running Out of Food in the Last Year: Never true    Ran Out of Food in the Last Year: Never true  Transportation Needs: No Transportation Needs (05/31/2023)   PRAPARE - Administrator, Civil Service (Medical): No    Lack of Transportation (Non-Medical): No  Physical Activity: Inactive (05/31/2023)   Exercise Vital Sign    Days of Exercise per Week: 0 days    Minutes of Exercise per Session: 0 min  Stress: No Stress Concern Present (05/31/2023)   [employer] of Occupational Health - Occupational Stress Questionnaire    Feeling of Stress : Not at all  Social Connections: Socially Integrated (05/31/2023)   Social Connection and Isolation Panel [NHANES]    Frequency of Communication with Friends and Family: More than three times a week    Frequency of Social Gatherings with Friends and Family: Once a week    Attends Religious Services: More than 4 times per year    Active Member of [employer] or Organizations: Yes    Attends Engineer, structural: More than 4 times per year    Marital Status: Married  Catering manager Violence: Not At Risk (05/31/2023)   Humiliation, Afraid, Rape, and Kick questionnaire    Fear of Current or Ex-Partner: No     Emotionally Abused: No    Physically Abused: No    Sexually Abused: No    Family History:   The patient's family history includes Cancer in his father; Diabetes in his father and maternal grandmother; Heart disease in his mother; Stroke in his maternal grandmother. There is no history of Colon cancer, Esophageal cancer, Rectal cancer, or Stomach cancer.    ROS:  Please see the history of present illness.  All other ROS reviewed and negative.     Physical Exam/Data:  There were no vitals filed for this visit. No intake or output data in the 24 hours ending 12/15/23 1215    12/15/2023    8:00 AM 12/07/2023    2:24 PM 10/17/2023  1:35 PM  Last 3 Weights  Weight (lbs) 204 lb 9.6 oz 213 lb 216 lb 6 oz  Weight (kg) 92.806 kg 96.616 kg 98.147 kg     There is no height or weight on file to calculate BMI.  General:  Well nourished, well developed, in no acute distress HEENT: normal Neck: no JVD Vascular: No carotid bruits; Distal pulses 2+ bilaterally   Cardiac:  normal S1, S2; RRR; no murmur  Lungs:  clear to auscultation bilaterally, no wheezing, rhonchi or rales  Abd: soft, nontender, no hepatomegaly  Ext: no edema Musculoskeletal:  No deformities, BUE and BLE strength normal and equal Skin: warm and dry  Neuro:  CNs 2-12 intact, no focal abnormalities noted Psych:  Normal affect    EKG:  The ECG that was done  was personally reviewed and demonstrates NSR 65bpm, TWI III  Relevant CV Studies:  LHC 12/15/23   Prox RCA to Mid RCA lesion is 40% stenosed.   Dist RCA lesion is 90% stenosed.   Prox LAD to Mid LAD lesion is 70% stenosed.   2nd Diag lesion is 80% stenosed.   Mid LM to Dist LM lesion is 30% stenosed.   1st Diag lesion is 99% stenosed.   Ost Cx to Prox Cx lesion is 70% stenosed.   Mid Cx to Dist Cx lesion is 99% stenosed.   2nd Mrg lesion is 90% stenosed.   The left ventricular systolic function is normal.   LV end diastolic pressure is normal.   The left  ventricular ejection fraction is 55-65% by visual estimate.   1.  Moderately calcified coronary arteries with severe three-vessel coronary artery disease including LAD diagonal bifurcation and left circumflex OM bifurcation. 2.  Normal LV systolic function.   Recommendations: Given severity of disease, recommend transferring to [facility] for CABG evaluation. Recommend starting heparin drip 2 hours after TR band removal Obtain an echocardiogram. The patient will be started on a statin.   Coronary Diagrams  Diagnostic Dominance: Right     Echo 2022   1. Left ventricular ejection fraction, by estimation, is 60 to 65%. The  left ventricle has normal function. The left ventricle has no regional  wall motion abnormalities. There is mild left ventricular hypertrophy.  Left ventricular diastolic parameters  are consistent with Grade I diastolic dysfunction (impaired relaxation).   2. Right ventricular systolic function is normal. The right ventricular  size is mildly enlarged. Tricuspid regurgitation signal is inadequate for  assessing PA pressure.   3. Left atrial size was mildly dilated.   4. The mitral valve is normal in structure. Trivial mitral valve  regurgitation. No evidence of mitral stenosis.   5. The aortic valve is tricuspid. There is mild thickening of the aortic  valve. Aortic valve regurgitation is trivial. Mild aortic valve sclerosis  is present, with no evidence of aortic valve stenosis.   Comparison(s): LVEF 60-65%.   Laboratory Data:  High Sensitivity Troponin:  No results for input(s): "TROPONINIHS" in the last 720 hours.    ChemistryNo results for input(s): "NA", "K", "CL", "CO2", "GLUCOSE", "BUN", "CREATININE", "CALCIUM", "MG", "GFRNONAA", "GFRAA", "ANIONGAP" in the last 168 hours.  No results for input(s): "PROT", "ALBUMIN", "AST", "ALT", "ALKPHOS", "BILITOT" in the last 168 hours. Lipids No results for input(s): "CHOL", "TRIG", "HDL", "LABVLDL", "LDLCALC",  "CHOLHDL" in the last 168 hours. HematologyNo results for input(s): "WBC", "RBC", "HGB", "HCT", "MCV", "MCH", "MCHC", "RDW", "PLT" in the last 168 hours. Thyroid No results for input(s): "TSH", "FREET4"  in the last 168 hours. BNPNo results for input(s): "BNP", "PROBNP" in the last 168 hours.  DDimer No results for input(s): "DDIMER" in the last 168 hours.   Radiology/Studies:  CARDIAC CATHETERIZATION Result Date: 12/15/2023   Prox RCA to Mid RCA lesion is 40% stenosed.   Dist RCA lesion is 90% stenosed.   Prox LAD to Mid LAD lesion is 70% stenosed.   2nd Diag lesion is 80% stenosed.   Mid LM to Dist LM lesion is 30% stenosed.   1st Diag lesion is 99% stenosed.   Ost Cx to Prox Cx lesion is 70% stenosed.   Mid Cx to Dist Cx lesion is 99% stenosed.   2nd Mrg lesion is 90% stenosed.   The left ventricular systolic function is normal.   LV end diastolic pressure is normal.   The left ventricular ejection fraction is 55-65% by visual estimate. 1.  Moderately calcified coronary arteries with severe three-vessel coronary artery disease including LAD diagonal bifurcation and left circumflex OM bifurcation. 2.  Normal LV systolic function. Recommendations: Given severity of disease, recommend transferring to [facility] for CABG evaluation. Recommend starting heparin drip 2 hours after TR band removal Obtain an echocardiogram. The patient will be started on a statin.     Assessment and Plan:   Unstable angina 3V CAD - patient presented to the cardiology office 12/07/23 with progressive unstable angina - LHC showed moderately calcified coronary arteries with severe 3V CAD, normal LVSF - echo ordered - continue ASA 81mg  daily and Lipitor 40mg  daily - continue PTA Coreg 6.25mg BID and Imdur 30mg  daily - plan to transfer patient to [facility] for CABG consult  HTN - Bps wnl - PTA losartan-hydrochlorothiazide 100-25mg  daily, Coreg 6.25mg BID, Imdur 30mg  daily - will hold HCTZ  HLD - LDL 91 - continue Lipitor  40mg  daily  PVCs - continue BB therapy  Code Status: Full Code  Severity of Illness: The appropriate patient status for this patient is INPATIENT. Inpatient status is judged to be reasonable and necessary in order to provide the required intensity of service to ensure the patient's safety. The patient's presenting symptoms, physical exam findings, and initial radiographic and laboratory data in the context of their chronic comorbidities is felt to place them at high risk for further clinical deterioration. Furthermore, it is not anticipated that the patient will be medically stable for discharge from the hospital within 2 midnights of admission.   * I certify that at the point of admission it is my clinical judgment that the patient will require inpatient hospital care spanning beyond 2 midnights from the point of admission due to high intensity of service, high risk for further deterioration and high frequency of surveillance required.*   For questions or updates, please contact [facility] Please consult [email or web address] for contact info under     Signed, Rylin Saez David Stall, PA-C  12/15/2023 12:15 PM

## 2023-12-16 ENCOUNTER — Inpatient Hospital Stay (HOSPITAL_COMMUNITY)
Admission: EM | Admit: 2023-12-16 | Discharge: 2023-12-24 | DRG: 236 | Disposition: A | Discharge status: Home / Self Care | Payer: Medicare HMO | Source: Other Acute Inpatient Hospital | Attending: Surgery | Admitting: Surgery

## 2023-12-16 DIAGNOSIS — Z79899 Other long term (current) drug therapy: Secondary | ICD-10-CM | POA: Diagnosis not present

## 2023-12-16 DIAGNOSIS — J929 Pleural plaque without asbestos: Secondary | ICD-10-CM | POA: Diagnosis not present

## 2023-12-16 DIAGNOSIS — I358 Other nonrheumatic aortic valve disorders: Secondary | ICD-10-CM | POA: Diagnosis present

## 2023-12-16 DIAGNOSIS — E785 Hyperlipidemia, unspecified: Secondary | ICD-10-CM | POA: Diagnosis not present

## 2023-12-16 DIAGNOSIS — Z8249 Family history of ischemic heart disease and other diseases of the circulatory system: Secondary | ICD-10-CM | POA: Diagnosis not present

## 2023-12-16 DIAGNOSIS — Z7982 Long term (current) use of aspirin: Secondary | ICD-10-CM | POA: Diagnosis not present

## 2023-12-16 DIAGNOSIS — I340 Nonrheumatic mitral (valve) insufficiency: Secondary | ICD-10-CM | POA: Diagnosis not present

## 2023-12-16 DIAGNOSIS — I25110 Atherosclerotic heart disease of native coronary artery with unstable angina pectoris: Secondary | ICD-10-CM | POA: Diagnosis not present

## 2023-12-16 DIAGNOSIS — Z48812 Encounter for surgical aftercare following surgery on the circulatory system: Secondary | ICD-10-CM | POA: Diagnosis not present

## 2023-12-16 DIAGNOSIS — I493 Ventricular premature depolarization: Secondary | ICD-10-CM | POA: Diagnosis not present

## 2023-12-16 DIAGNOSIS — M1990 Unspecified osteoarthritis, unspecified site: Secondary | ICD-10-CM | POA: Diagnosis present

## 2023-12-16 DIAGNOSIS — Z01810 Encounter for preprocedural cardiovascular examination: Secondary | ICD-10-CM | POA: Diagnosis not present

## 2023-12-16 DIAGNOSIS — D696 Thrombocytopenia, unspecified: Secondary | ICD-10-CM | POA: Diagnosis not present

## 2023-12-16 DIAGNOSIS — R918 Other nonspecific abnormal finding of lung field: Secondary | ICD-10-CM | POA: Diagnosis not present

## 2023-12-16 DIAGNOSIS — I200 Unstable angina: Secondary | ICD-10-CM

## 2023-12-16 DIAGNOSIS — Z79890 Hormone replacement therapy (postmenopausal): Secondary | ICD-10-CM | POA: Diagnosis not present

## 2023-12-16 DIAGNOSIS — Z452 Encounter for adjustment and management of vascular access device: Secondary | ICD-10-CM | POA: Diagnosis not present

## 2023-12-16 DIAGNOSIS — Z01818 Encounter for other preprocedural examination: Secondary | ICD-10-CM | POA: Diagnosis not present

## 2023-12-16 DIAGNOSIS — I2489 Other forms of acute ischemic heart disease: Secondary | ICD-10-CM | POA: Diagnosis not present

## 2023-12-16 DIAGNOSIS — Z833 Family history of diabetes mellitus: Secondary | ICD-10-CM | POA: Diagnosis not present

## 2023-12-16 DIAGNOSIS — I440 Atrioventricular block, first degree: Secondary | ICD-10-CM | POA: Diagnosis not present

## 2023-12-16 DIAGNOSIS — E039 Hypothyroidism, unspecified: Secondary | ICD-10-CM | POA: Diagnosis present

## 2023-12-16 DIAGNOSIS — Z4682 Encounter for fitting and adjustment of non-vascular catheter: Secondary | ICD-10-CM | POA: Diagnosis not present

## 2023-12-16 DIAGNOSIS — I10 Essential (primary) hypertension: Secondary | ICD-10-CM | POA: Diagnosis not present

## 2023-12-16 DIAGNOSIS — Z951 Presence of aortocoronary bypass graft: Secondary | ICD-10-CM

## 2023-12-16 DIAGNOSIS — J90 Pleural effusion, not elsewhere classified: Secondary | ICD-10-CM | POA: Diagnosis not present

## 2023-12-16 DIAGNOSIS — Z823 Family history of stroke: Secondary | ICD-10-CM

## 2023-12-16 DIAGNOSIS — I119 Hypertensive heart disease without heart failure: Secondary | ICD-10-CM | POA: Diagnosis present

## 2023-12-16 DIAGNOSIS — I510 Cardiac septal defect, acquired: Secondary | ICD-10-CM | POA: Diagnosis not present

## 2023-12-16 DIAGNOSIS — I2510 Atherosclerotic heart disease of native coronary artery without angina pectoris: Secondary | ICD-10-CM | POA: Diagnosis not present

## 2023-12-16 DIAGNOSIS — R21 Rash and other nonspecific skin eruption: Secondary | ICD-10-CM

## 2023-12-16 DIAGNOSIS — R0989 Other specified symptoms and signs involving the circulatory and respiratory systems: Secondary | ICD-10-CM | POA: Diagnosis not present

## 2023-12-16 LAB — HEPARIN LEVEL (UNFRACTIONATED)
Heparin Unfractionated: 0.25 [IU]/mL — ABNORMAL LOW (ref 0.30–0.70)
Heparin Unfractionated: 0.46 [IU]/mL (ref 0.30–0.70)
Heparin Unfractionated: 0.53 [IU]/mL (ref 0.30–0.70)

## 2023-12-16 MED ORDER — ASPIRIN 81 MG PO TBEC: 81.0000 mg | DELAYED_RELEASE_TABLET | Freq: Every day | ORAL | Status: DC

## 2023-12-16 MED ORDER — SODIUM CHLORIDE 0.9% FLUSH
3.0000 mL | Freq: Two times a day (BID) | INTRAVENOUS | Status: DC
  Administered 2023-12-16 – 2023-12-17 (×2): 3 mL via INTRAVENOUS

## 2023-12-16 MED ORDER — ATORVASTATIN CALCIUM 40 MG PO TABS
40.0000 mg | ORAL_TABLET | Freq: Every day | ORAL | Status: DC
  Administered 2023-12-17 – 2023-12-24 (×7): 40 mg via ORAL
  Filled 2023-12-16 (×7): qty 1

## 2023-12-16 MED ORDER — HEPARIN BOLUS VIA INFUSION
1350.0000 [IU] | Freq: Once | INTRAVENOUS | Status: AC
  Administered 2023-12-16: 1350 [IU] via INTRAVENOUS
  Filled 2023-12-16: qty 1350

## 2023-12-16 MED ORDER — NITROGLYCERIN 0.4 MG SL SUBL: 0.4000 mg | SUBLINGUAL_TABLET | SUBLINGUAL | Status: DC | PRN

## 2023-12-16 MED ORDER — SODIUM CHLORIDE 0.9% FLUSH: 3.0000 mL | INTRAVENOUS | Status: DC | PRN

## 2023-12-16 MED ORDER — ISOSORBIDE MONONITRATE ER 30 MG PO TB24
30.0000 mg | ORAL_TABLET | Freq: Every day | ORAL | Status: DC
  Administered 2023-12-17 – 2023-12-18 (×2): 30 mg via ORAL
  Filled 2023-12-16 (×2): qty 1

## 2023-12-16 MED ORDER — ASPIRIN 81 MG PO TBEC
81.0000 mg | DELAYED_RELEASE_TABLET | Freq: Every day | ORAL | Status: DC
  Administered 2023-12-17 – 2023-12-18 (×2): 81 mg via ORAL
  Filled 2023-12-16 (×2): qty 1

## 2023-12-16 MED ORDER — HEPARIN (PORCINE) 25000 UT/250ML-% IV SOLN
1400.0000 [IU]/h | INTRAVENOUS | Status: DC
  Administered 2023-12-17 – 2023-12-18 (×3): 1400 [IU]/h via INTRAVENOUS
  Filled 2023-12-16 (×3): qty 250

## 2023-12-16 MED ORDER — LEVOTHYROXINE SODIUM 100 MCG PO TABS
100.0000 ug | ORAL_TABLET | Freq: Every day | ORAL | Status: DC
  Administered 2023-12-17 – 2023-12-19 (×3): 100 ug via ORAL
  Filled 2023-12-16 (×4): qty 1

## 2023-12-16 MED ORDER — ACETAMINOPHEN 325 MG PO TABS
650.0000 mg | ORAL_TABLET | ORAL | Status: DC | PRN
Start: 2023-12-16 — End: 2023-12-19
  Administered 2023-12-17: 650 mg via ORAL
  Filled 2023-12-16: qty 2

## 2023-12-16 MED ORDER — CARVEDILOL 6.25 MG PO TABS
6.2500 mg | ORAL_TABLET | Freq: Two times a day (BID) | ORAL | Status: DC
  Administered 2023-12-17 – 2023-12-18 (×4): 6.25 mg via ORAL
  Filled 2023-12-16 (×4): qty 1

## 2023-12-16 MED ORDER — SODIUM CHLORIDE 0.9 % IV SOLN: 250.0000 mL | INTRAVENOUS | Status: AC | PRN

## 2023-12-16 MED ORDER — ONDANSETRON HCL 4 MG/2ML IJ SOLN: 4.0000 mg | Freq: Four times a day (QID) | INTRAMUSCULAR | Status: DC | PRN

## 2023-12-16 NOTE — Progress Notes (Signed)
   Rounding Note    Patient Name: Terry Vega Date of Encounter: 12/16/2023  [facility] Cardiologist: Lorine Bears, MD   Subjective   Terry Vega is a 78 y.o. male with h/o HTN, PVCs, diastolic dysfunction, lower extremity edema and hypothyroidism who was seen at [facility] regional on 1/31 for unstable angina, found to have 3 vessel coronary artery disease. He was Transferred to [facility] on 2/1 for CABG evaluation .   Interval history -doing well, denies chest pain  Inpatient Medications    Scheduled Meds:  [START ON 12/17/2023] aspirin EC  81 mg Oral Daily   [START ON 12/17/2023] atorvastatin  40 mg Oral Daily   [START ON 12/17/2023] carvedilol  6.25 mg Oral BID   [START ON 12/17/2023] isosorbide mononitrate  30 mg Oral Daily   [START ON 12/17/2023] levothyroxine  100 mcg Oral Daily   sodium chloride flush  3 mL Intravenous Q12H   Continuous Infusions:  sodium chloride     heparin     PRN Meds: sodium chloride, acetaminophen, nitroGLYCERIN, ondansetron (ZOFRAN) IV, sodium chloride flush   Vital Signs    Vitals:   12/16/23 2038  BP: (!) 142/78  Pulse: 66  Resp: 18  Temp: 98.4 F (36.9 C)  TempSrc: Oral  SpO2: 96%   No intake or output data in the 24 hours ending 12/16/23 2320     12/15/2023    8:00 AM 12/07/2023    2:24 PM 10/17/2023    1:35 PM  Last 3 Weights  Weight (lbs) 204 lb 9.6 oz 213 lb 216 lb 6 oz  Weight (kg) 92.806 kg 96.616 kg 98.147 kg      Telemetry    SR with occasional PVCs - Personally Reviewed  Physical Exam   GEN: No acute distress.   Neck: No JVD Cardiac: RRR, no murmurs, rubs, or gallops.  Respiratory: Clear to auscultation bilaterally. GI: Soft, nontender, non-distended  MS: No edema; No deformity. Neuro:  Nonfocal  Psych: Normal affect   New pertinent results (labs, ECG, imaging, cardiac studies)    Cath reviewed Echo pending  Patient Profile     Terry Vega is a 78 y.o. male with h/o HTN, PVCs,  diastolic dysfunction, lower extremity edema and hypothyroidism who was seen at [facility] regional on 1/31 for unstable angina, found to have 3 vessel coronary artery disease. He was Transferred to [facility] on 2/1 for CABG evaluation .   Assessment & Plan    #Unstable angina Severe 3 vessel CAD -denies chest pain. Awaiting CABG evaluation. We will make no changes today.  -echo ordered on 2/1 -continue aspirin, atorvastatin -continue carvedilol, imdur -continue heparin  #Hypertension -holding home HCTZ -continue carvedilol, imdur, losartan  #PVCs Asymptomatic -continue carvedilol    Signed, Donavan Burnet, MD  12/16/2023, 11:20 PM

## 2023-12-16 NOTE — Consult Note (Addendum)
Pharmacy Consult Note - Anticoagulation  Pharmacy Consult for heparin Indication: unstable angina, in need of CABG  PATIENT MEASUREMENTS: Height: 5\' 9"  (175.3 cm) Weight: 92.8 kg (204 lb 9.6 oz) IBW/kg (Calculated) : 70.7 HEPARIN DW (KG): 89.7  VITAL SIGNS: Temp: 98.3 F (36.8 C) (02/01 1125) Temp Source: Oral (02/01 1125) BP: 123/71 (02/01 1125) Pulse Rate: 69 (02/01 1125)  Recent Labs    12/15/23 2031 12/16/23 0148 12/16/23 1057  HGB 14.4  --   --   HCT 42.2  --   --   PLT 174  --   --   APTT 40*  --   --   LABPROT 14.5  --   --   INR 1.1  --   --   HEPARINUNFRC  --    < > 0.46   < > = values in this interval not displayed.    Estimated Creatinine Clearance: 71.3 mL/min (by C-G formula based on SCr of 0.96 mg/dL).  PAST MEDICAL HISTORY: Past Medical History:  Diagnosis Date   Allergy    Arthritis    Phreesia 07/21/2020   Diastolic dysfunction    a. 07/2021 Echo: EF 60-65%.  Mild LVH.  Grade 1 DD.  Normal RV fxn. mildly dil LA. Triv MR/AI. Mild AoV sclerosis.   Heart murmur    Hypertension    Hypothyroidism    PVC's (premature ventricular contractions)    ASSESSMENT: 78 y.o. male with is presenting with unstable angina. Marland Kitchen He is in need of CABG but will not be able to transfer to [facility] tonight. Patient is currently stable. Baseline labs have been ordered. In anticipation of that procedure, pharmacy has been consulted to initiate and manage heparin intravenous infusion.  Baseline Labs: aPTT 14.5 INR 1.1 Hemoglobin 14.4 Plt 174  Pertinent medications: No chronic anticoagulation PTA from chart review  Goal(s) of therapy: Heparin level 0.3 - 0.7 units/mL Monitor platelets by anticoagulation protocol: Yes   Date Time aPTT/HL Rate/Comment 0201    0148      0.25              1400 units/hr, SUBtherapeutic 0201 1057   0.46  1400 units/hr, Therapeutic x 1    PLAN: Heparin level is therapeutic x 1 Continue heparin drip at 1,400 units/hr Will re-check heparin  in 8 hours to confirm therapeutic level Monitor CBC daily while on heparin infusion.  Effie Shy, PharmD Pharmacy Resident  12/16/2023 12:39 PM

## 2023-12-16 NOTE — Progress Notes (Signed)
   Rounding Note    Patient Name: Terry Vega Date of Encounter: 12/16/2023  [facility] Cardiologist: Lorine Bears, MD   Subjective   No acute events overnight. Awaiting transfer to [facility] for CABG. No chest pain or shortness of breath.  Inpatient Medications    Scheduled Meds:  aspirin EC  81 mg Oral Daily   atorvastatin  40 mg Oral Daily   carvedilol  6.25 mg Oral BID WC   isosorbide mononitrate  30 mg Oral Daily   losartan  100 mg Oral Daily   sodium chloride flush  3 mL Intravenous Q12H   sodium chloride flush  3 mL Intravenous Q12H   Continuous Infusions:  sodium chloride     sodium chloride     heparin 1,400 Units/hr (12/16/23 0857)   PRN Meds: sodium chloride, sodium chloride, acetaminophen, nitroGLYCERIN, ondansetron (ZOFRAN) IV, sodium chloride flush, sodium chloride flush   Vital Signs    Vitals:   12/16/23 0015 12/16/23 0351 12/16/23 0737 12/16/23 1125  BP: (!) 144/73 138/77 137/78 123/71  Pulse: 71 64 66 69  Resp: 17 16 18 18   Temp: 98.3 F (36.8 C) 98.2 F (36.8 C) 98.4 F (36.9 C) 98.3 F (36.8 C)  TempSrc: Oral  Oral Oral  SpO2: 94% 96% 96% 97%  Weight:      Height:        Intake/Output Summary (Last 24 hours) at 12/16/2023 1418 Last data filed at 12/16/2023 0900 Gross per 24 hour  Intake 848.13 ml  Output 300 ml  Net 548.13 ml      12/15/2023    8:00 AM 12/07/2023    2:24 PM 10/17/2023    1:35 PM  Last 3 Weights  Weight (lbs) 204 lb 9.6 oz 213 lb 216 lb 6 oz  Weight (kg) 92.806 kg 96.616 kg 98.147 kg      Telemetry    SR with occasional PVCs - Personally Reviewed  Physical Exam   GEN: No acute distress.   Neck: No JVD Cardiac: RRR, no murmurs, rubs, or gallops.  Respiratory: Clear to auscultation bilaterally. GI: Soft, nontender, non-distended  MS: No edema; No deformity. Neuro:  Nonfocal  Psych: Normal affect   New pertinent results (labs, ECG, imaging, cardiac studies)    Cath reviewed Echo  pending  Patient Profile     78 y.o. male with h/o HTN, PVCs, diastolic dysfunction, lower extremity edema and hypothyroidism who is being seen 12/15/2023 for the evaluation of unstable angina. Cath revealed severe three vessel CAD, recommended for CABG/transfer to [facility]  Assessment & Plan    Unstable angina Severe 3 vessel CAD -recommended for CABG; awaiting transfer to [facility] -echo pending -continue aspirin, atorvastatin -continue carvedilol, imdur -continue heparin  Hypertension -holding home HCTZ -continue carvedilol, imdur, losartan  PVCs -continue carvedilol    Signed, Jodelle Red, MD  12/16/2023, 2:18 PM

## 2023-12-16 NOTE — Consult Note (Signed)
Pharmacy Consult Note - Anticoagulation  Pharmacy Consult for heparin Indication: unstable angina, in need of CABG  PATIENT MEASUREMENTS: Height: 5\' 9"  (175.3 cm) Weight: 92.8 kg (204 lb 9.6 oz) IBW/kg (Calculated) : 70.7 HEPARIN DW (KG): 89.7  VITAL SIGNS: Temp: 98 F (36.7 C) (02/01 1521) Temp Source: Oral (02/01 1521) BP: 122/69 (02/01 1521) Pulse Rate: 67 (02/01 1521)  Recent Labs    12/15/23 2031 12/16/23 0148 12/16/23 1906  HGB 14.4  --   --   HCT 42.2  --   --   PLT 174  --   --   APTT 40*  --   --   LABPROT 14.5  --   --   INR 1.1  --   --   HEPARINUNFRC  --    < > 0.53   < > = values in this interval not displayed.    Estimated Creatinine Clearance: 71.3 mL/min (by C-G formula based on SCr of 0.96 mg/dL).  PAST MEDICAL HISTORY: Past Medical History:  Diagnosis Date   Allergy    Arthritis    Phreesia 07/21/2020   Diastolic dysfunction    a. 07/2021 Echo: EF 60-65%.  Mild LVH.  Grade 1 DD.  Normal RV fxn. mildly dil LA. Triv MR/AI. Mild AoV sclerosis.   Heart murmur    Hypertension    Hypothyroidism    PVC's (premature ventricular contractions)    ASSESSMENT: 77 y.o. male with is presenting with unstable angina. Marland Kitchen He is in need of CABG but will not be able to transfer to [facility] tonight. Patient is currently stable. Baseline labs have been ordered. In anticipation of that procedure, pharmacy has been consulted to initiate and manage heparin intravenous infusion.  Baseline Labs: aPTT 14.5 INR 1.1 Hemoglobin 14.4 Plt 174  Pertinent medications: No chronic anticoagulation PTA from chart review  Goal(s) of therapy: Heparin level 0.3 - 0.7 units/mL Monitor platelets by anticoagulation protocol: Yes   Date Time aPTT/HL Rate/Comment 0201    0148      0.25              1400 units/hr, SUBtherapeutic 0201 1057   0.46  1400 units/hr, Therapeutic x 1 0201 1906   0.53  1400 units/hr, Therapeutic x 2    PLAN: Heparin level is therapeutic x 2 Continue heparin  drip at 1,400 units/hr Will switch to daily monitoring of heparin levels Monitor CBC daily while on heparin infusion.  Merryl Hacker, PharmD Clinical Pharmacist   12/16/2023 7:26 PM

## 2023-12-16 NOTE — Progress Notes (Signed)
Pt called spouse Davidmichael Zarazua to inform her of transport to [facility]. Pt denies pain and any discomfort at this time. VSS. Heparing gtt infusing at 14u/hr. Dr. Cristal Deer paged and confirmed she will complete EMTLA form. No acute distress noted or verbally endorsed by patient at this time.

## 2023-12-16 NOTE — Consult Note (Signed)
Pharmacy Consult Note - Anticoagulation  Pharmacy Consult for heparin Indication: unstable angina, in need of CABG  PATIENT MEASUREMENTS: Height: 5\' 9"  (175.3 cm) Weight: 92.8 kg (204 lb 9.6 oz) IBW/kg (Calculated) : 70.7 HEPARIN DW (KG): 89.7  VITAL SIGNS: Temp: 98.3 F (36.8 C) (02/01 0015) Temp Source: Oral (01/31 2042) BP: 144/73 (02/01 0015) Pulse Rate: 71 (02/01 0015)  Recent Labs    12/15/23 2031 12/16/23 0148  HGB 14.4  --   HCT 42.2  --   PLT 174  --   APTT 40*  --   LABPROT 14.5  --   INR 1.1  --   HEPARINUNFRC  --  0.25*    Estimated Creatinine Clearance: 71.3 mL/min (by C-G formula based on SCr of 0.96 mg/dL).  PAST MEDICAL HISTORY: Past Medical History:  Diagnosis Date   Allergy    Arthritis    Phreesia 07/21/2020   Diastolic dysfunction    a. 07/2021 Echo: EF 60-65%.  Mild LVH.  Grade 1 DD.  Normal RV fxn. mildly dil LA. Triv MR/AI. Mild AoV sclerosis.   Heart murmur    Hypertension    Hypothyroidism    PVC's (premature ventricular contractions)     ASSESSMENT: 78 y.o. male with PMH including is presenting with unstable angina. Patient is not on chronic anticoagulation per chart review. He is in need of CABG but  will not be able to transfer to [facility] tonight. Patient is currently stable. Baseline labs have been ordered. In anticipation of that procedure, pharmacy has been consulted to initiate and manage heparin intravenous infusion.  Pertinent medications: No chronic anticoagulation PTA from chart review  Goal(s) of therapy: Heparin level 0.3 - 0.7 units/mL Monitor platelets by anticoagulation protocol: Yes      Date Time aPTT/HL Rate/Comment 0201    0148      0.25              1400 units/hr, SUBtherapeutic     PLAN: 2/1:  HL @ 0148 = 0.25, SUBtherapeutic - Will order heparin 1350 units IV X 1 bolus and increase drip rate to 1400 units/hr - Will recheck HL 8 hrs after rate change   Monitor CBC daily while on heparin  infusion.  Merril Isakson D Clinical Pharmacist 12/16/2023 2:36 AM

## 2023-12-16 NOTE — Plan of Care (Signed)
  Problem: Education: Goal: Understanding of CV disease, CV risk reduction, and recovery process will improve Outcome: Progressing Goal: Individualized Educational Video(s) Outcome: Progressing   Problem: Activity: Goal: Ability to return to baseline activity level will improve Outcome: Progressing   Problem: Cardiovascular: Goal: Ability to achieve and maintain adequate cardiovascular perfusion will improve Outcome: Progressing Goal: Vascular access site(s) Level 0-1 will be maintained Outcome: Progressing   Problem: Health Behavior/Discharge Planning: Goal: Ability to safely manage health-related needs after discharge will improve Outcome: Progressing   Problem: Education: Goal: Understanding of cardiac disease, CV risk reduction, and recovery process will improve Outcome: Progressing Goal: Individualized Educational Video(s) Outcome: Progressing   Problem: Activity: Goal: Ability to tolerate increased activity will improve Outcome: Progressing   Problem: Cardiac: Goal: Ability to achieve and maintain adequate cardiovascular perfusion will improve Outcome: Progressing   Problem: Health Behavior/Discharge Planning: Goal: Ability to safely manage health-related needs after discharge will improve Outcome: Progressing   Problem: Education: Goal: Knowledge of General Education information will improve Description: Including pain rating scale, medication(s)/side effects and non-pharmacologic comfort measures Outcome: Progressing   Problem: Health Behavior/Discharge Planning: Goal: Ability to manage health-related needs will improve Outcome: Progressing   Problem: Clinical Measurements: Goal: Ability to maintain clinical measurements within normal limits will improve Outcome: Progressing Goal: Will remain free from infection Outcome: Progressing Goal: Diagnostic test results will improve Outcome: Progressing Goal: Respiratory complications will improve Outcome:  Progressing Goal: Cardiovascular complication will be avoided Outcome: Progressing   Problem: Activity: Goal: Risk for activity intolerance will decrease Outcome: Progressing   Problem: Nutrition: Goal: Adequate nutrition will be maintained Outcome: Progressing   Problem: Coping: Goal: Level of anxiety will decrease Outcome: Progressing   Problem: Elimination: Goal: Will not experience complications related to bowel motility Outcome: Progressing Goal: Will not experience complications related to urinary retention Outcome: Progressing   Problem: Pain Managment: Goal: General experience of comfort will improve and/or be controlled Outcome: Progressing   Problem: Safety: Goal: Ability to remain free from injury will improve Outcome: Progressing   Problem: Skin Integrity: Goal: Risk for impaired skin integrity will decrease Outcome: Progressing

## 2023-12-17 ENCOUNTER — Inpatient Hospital Stay (HOSPITAL_COMMUNITY): Payer: Medicare HMO

## 2023-12-17 DIAGNOSIS — I200 Unstable angina: Secondary | ICD-10-CM

## 2023-12-17 DIAGNOSIS — I10 Essential (primary) hypertension: Secondary | ICD-10-CM

## 2023-12-17 DIAGNOSIS — I2489 Other forms of acute ischemic heart disease: Secondary | ICD-10-CM | POA: Diagnosis not present

## 2023-12-17 DIAGNOSIS — I25110 Atherosclerotic heart disease of native coronary artery with unstable angina pectoris: Secondary | ICD-10-CM | POA: Diagnosis not present

## 2023-12-17 DIAGNOSIS — I493 Ventricular premature depolarization: Secondary | ICD-10-CM

## 2023-12-17 DIAGNOSIS — I340 Nonrheumatic mitral (valve) insufficiency: Secondary | ICD-10-CM | POA: Diagnosis not present

## 2023-12-17 LAB — BASIC METABOLIC PANEL
Anion gap: 9 (ref 5–15)
BUN: 16 mg/dL (ref 8–23)
CO2: 26 mmol/L (ref 22–32)
Calcium: 9.2 mg/dL (ref 8.9–10.3)
Chloride: 102 mmol/L (ref 98–111)
Creatinine, Ser: 0.85 mg/dL (ref 0.61–1.24)
GFR, Estimated: >60 mL/min (ref >60)
Glucose, Bld: 105 mg/dL — ABNORMAL HIGH (ref 70–99)
Potassium: 3.1 mmol/L — ABNORMAL LOW (ref 3.5–5.1)
Sodium: 137 mmol/L (ref 135–145)

## 2023-12-17 LAB — CBC
HCT: 37.1 % — ABNORMAL LOW (ref 39.0–52.0)
Hemoglobin: 12.7 g/dL — ABNORMAL LOW (ref 13.0–17.0)
MCH: 30.9 pg (ref 26.0–34.0)
MCHC: 34.2 g/dL (ref 30.0–36.0)
MCV: 90.3 fL (ref 80.0–100.0)
Platelets: 149 10*3/uL — ABNORMAL LOW (ref 150–400)
RBC: 4.11 MIL/uL — ABNORMAL LOW (ref 4.22–5.81)
RDW: 12.4 % (ref 11.5–15.5)
WBC: 5.3 10*3/uL (ref 4.0–10.5)
nRBC: 0 % (ref 0.0–0.2)

## 2023-12-17 LAB — ECHOCARDIOGRAM COMPLETE
AR max vel: 2.66 cm2
AV Peak grad: 10.8 mm[Hg]
Ao pk vel: 1.64 m/s
Area-P 1/2: 2.39 cm2
Height: 69 in
P 1/2 time: 678 ms
S' Lateral: 3.1 cm
Weight: 3245.17 [oz_av]

## 2023-12-17 LAB — HEPARIN LEVEL (UNFRACTIONATED): Heparin Unfractionated: 0.4 [IU]/mL (ref 0.30–0.70)

## 2023-12-17 NOTE — H&P (View-Only) (Signed)
[address]       [place] [address]             [phone]        Terry Vega [facility] Medical Record #[id number] Date of Birth: May 09, 1946  Referring: Iran Ouch, MD Primary Care: Shade Flood, MD Primary Cardiologist:Muhammad Kirke Corin, MD  Chief Complaint:   Unstable angina  History of Present Illness:   We are asked to see this 78 year old male with history of hypertension, PVCs, diastolic dysfunction and hypothyroidism in cardiothoracic surgical consultation for consideration of coronary artery surgical revascularization.  He was previously diagnosed with PVCs in 2014.  He underwent cardiology evaluation including echocardiogram which showed at that time with normal left ventricular ejection fraction.  Stress testing showed no evidence of ischemia.  He was initially treated with diltiazem but later developed lower extremity swelling and had been changed to Coreg.  Follow-up echocardiogram in September 2022 showed EF 60 to 65%, mild LVH, grade 1 diastolic dysfunction, mild dilated LA, mild MR/AI, and mild aortic valve sclerosis.  He was recently seen in the cardiology office with complaints of exertional chest pressure and dyspnea.  He was subsequently set up for left heart catheterization which was done on 12/15/2023 and showed moderately calcified arteries with severe three-vessel CAD.  Due to the severity of these findings he was felt to require transfer to [facility] for CT surgical evaluation/possible CABG.  Cardiac risk factors also include hyperlipidemia.  He has no previous history of tobacco abuse.  Repeat echocardiogram is pending.    Current Activity/ Functional Status: Patient was independent with mobility/ambulation, transfers, ADL's, IADL's.   Zubrod Score: At the time of surgery this patient's most appropriate activity status/level should be described as: []     0    Normal activity, no symptoms [x]     1    Restricted in physical  strenuous activity but ambulatory, able to do out light work []     2    Ambulatory and capable of self care, unable to do work activities, up and about                 more than 50%  Of the time                            []     3    Only limited self care, in bed greater than 50% of waking hours []     4    Completely disabled, no self care, confined to bed or chair []     5    Moribund  Past Medical History:  Diagnosis Date   Allergy    Arthritis    Phreesia 07/21/2020   Diastolic dysfunction    a. 07/2021 Echo: EF 60-65%.  Mild LVH.  Grade 1 DD.  Normal RV fxn. mildly dil LA. Triv MR/AI. Mild AoV sclerosis.   Heart murmur    Hypertension    Hypothyroidism    PVC's (premature ventricular contractions)     Past Surgical History:  Procedure Laterality Date   COLONOSCOPY     HEMORRHOID SURGERY     VASECTOMY      Social History   Tobacco Use  Smoking Status Never  Smokeless Tobacco Never    Social History   Substance and Sexual Activity  Alcohol Use Yes   Comment: occasionally     No Known Allergies  Current  Facility-Administered Medications  Medication Dose Route Frequency Provider Last Rate Last Admin   0.9 %  sodium chloride infusion  250 mL Intravenous PRN Furth, Cadence H, PA-C       acetaminophen (TYLENOL) tablet 650 mg  650 mg Oral Q4H PRN Sondra Barges A, MD       aspirin EC tablet 81 mg  81 mg Oral Daily Furth, Cadence H, PA-C       atorvastatin (LIPITOR) tablet 40 mg  40 mg Oral Daily Furth, Cadence H, PA-C       carvedilol (COREG) tablet 6.25 mg  6.25 mg Oral BID Furth, Cadence H, PA-C       heparin ADULT infusion 100 units/mL (25000 units/252mL)  1,400 Units/hr Intravenous Continuous Rollene Fare, RPH 14 mL/hr at 12/17/23 0524 1,400 Units/hr at 12/17/23 0524   isosorbide mononitrate (IMDUR) 24 hr tablet 30 mg  30 mg Oral Daily Furth, Cadence H, PA-C       levothyroxine (SYNTHROID) tablet 100 mcg  100 mcg Oral Daily Furth, Cadence H, PA-C   100 mcg at  12/17/23 0517   nitroGLYCERIN (NITROSTAT) SL tablet 0.4 mg  0.4 mg Sublingual Q5 Min x 3 PRN Furth, Cadence H, PA-C       ondansetron (ZOFRAN) injection 4 mg  4 mg Intravenous Q6H PRN Derrell Lolling, Damarcus A, MD       sodium chloride flush (NS) 0.9 % injection 3 mL  3 mL Intravenous Q12H Furth, Cadence H, PA-C   3 mL at 12/16/23 2109   sodium chloride flush (NS) 0.9 % injection 3 mL  3 mL Intravenous PRN Furth, Cadence H, PA-C        Medications Prior to Admission  Medication Sig Dispense Refill Last Dose/Taking   aspirin 81 MG tablet Take 81 mg by mouth daily.      atorvastatin (LIPITOR) 40 MG tablet Take 1 tablet (40 mg total) by mouth daily. 30 tablet 6    carvedilol (COREG) 6.25 MG tablet Take 1 tablet (6.25 mg total) by mouth 2 (two) times daily. 180 tablet 3    cholecalciferol (VITAMIN D) 1000 UNITS tablet Take 1,000 Units by mouth daily.      isosorbide mononitrate (IMDUR) 30 MG 24 hr tablet Take 1 tablet (30 mg total) by mouth daily. 90 tablet 3    levothyroxine (SYNTHROID) 100 MCG tablet Take 1 tablet (100 mcg total) by mouth daily. 90 tablet 3    losartan-hydrochlorothiazide (HYZAAR) 100-25 MG tablet Take 1 tablet by mouth daily. 90 tablet 3    Multiple Vitamin (MULTIVITAMIN) tablet Take 1 tablet by mouth daily.      nitroGLYCERIN (NITROSTAT) 0.4 MG SL tablet Place 1 tablet (0.4 mg total) under the tongue every 5 (five) minutes as needed for chest pain. 25 tablet 1    PREVIDENT 5000 BOOSTER PLUS 1.1 % PSTE Place 1 application  onto teeth at bedtime.      triamcinolone cream (KENALOG) 0.1 % APPLY 1 APPLICATION TOPICALLY 2 TIMES DAILY. (Patient taking differently: Apply 1 Application topically daily as needed (irritation).) 30 g 3    vitamin E 1000 UNIT capsule Take 1,000 Units by mouth daily.       Family History  Problem Relation Age of Onset   Heart disease Mother    Cancer Father    Diabetes Father    Stroke Maternal Grandmother    Diabetes Maternal Grandmother    Colon cancer  Neg Hx    Esophageal cancer Neg Hx  Rectal cancer Neg Hx    Stomach cancer Neg Hx      Review of Systems:   Review of Systems  Constitutional:  Positive for malaise/fatigue.  Eyes:        Has cataracts  Cardiovascular:  Positive for chest pain, palpitations and leg swelling.  Gastrointestinal:        H/o hemorrhoids- no recent issues  Genitourinary:        Nocturia  Musculoskeletal:  Positive for joint pain.       Left shoulder likely rotator cuff injury with decreased ROM     Physical Exam: BP (!) 140/80 (BP Location: Right Arm)   Pulse 60   Temp 99 F (37.2 C) (Oral)   Resp 18   Ht 5\' 9"  (1.753 m)   Wt 92 kg   SpO2 94%   BMI 29.95 kg/m    General appearance: alert, cooperative, and no distress Head: Normocephalic, without obvious abnormality, atraumatic Neck: no adenopathy, no carotid bruit, no JVD, supple, symmetrical, trachea midline, and thyroid not enlarged, symmetric, no tenderness/mass/nodules Lymph nodes: Cervical, supraclavicular, and axillary nodes normal. Resp: clear to auscultation bilaterally Back: symmetric, no curvature. ROM normal. No CVA tenderness. Cardio: regular rate and rhythm, S1, S2 normal, no murmur, click, rub or gallop GI: soft, non-tender; bowel sounds normal; no masses,  no organomegaly Extremities: previous left anterior tibial (mid) trauma, well healed Neurologic: Grossly normal  Diagnostic Studies & Laboratory data:     Recent Radiology Findings:   No results found.   I have independently reviewed the above radiologic studies and discussed with the patient   Recent Lab Findings: Lab Results  Component Value Date   WBC 5.3 12/17/2023   HGB 12.7 (L) 12/17/2023   HCT 37.1 (L) 12/17/2023   PLT 149 (L) 12/17/2023   GLUCOSE 105 (H) 12/17/2023   CHOL 152 08/04/2023   TRIG 97.0 08/04/2023   HDL 42.00 08/04/2023   LDLCALC 91 08/04/2023   ALT 15 08/04/2023   AST 18 08/04/2023   NA 137 12/17/2023   K 3.1 (L) 12/17/2023   CL  102 12/17/2023   CREATININE 0.85 12/17/2023   BUN 16 12/17/2023   CO2 26 12/17/2023   TSH 4.40 08/04/2023   INR 1.1 12/15/2023   HGBA1C 5.3 07/28/2021      Assessment / Plan: Severe three-vessel coronary artery disease with progressive angina. History of PVCs History of hypothyroidism Hypertension Arthritis  Plan: Patient appears to be a suitable candidate to proceed with CABG.  Surgeon will evaluate the patient and all relevant studies to determine timing and final decision as to candidacy.    I  spent 40 minutes counseling the patient face to face.     Rowe Clack, PA-C  12/17/2023 10:40 AM   Chart reviewed, patient examined, agree with above.  This 78 year old gentleman present with a 3 wk history of exertional chest discomfort radiating into the left shoulder and arm, relieved with rest. Cath showed severe 3V coronary artery disease with moderately calcified vessels and high grade bifurcation stenoses in the LAD and LCX. LVEF normal with no significant valvular disease. I agree that CABG is the best treatment for him. I discussed the operative procedure with the patient and his wife and two daughters including alternatives, benefits and risks; including but not limited to bleeding, blood transfusion, infection, stroke, myocardial infarction, graft failure, heart block requiring a permanent pacemaker, organ dysfunction, and death.  Terry Vega understands and agrees to proceed.  Plan CABG in am tomorrow.

## 2023-12-17 NOTE — Consult Note (Cosign Needed)
[address]       [place] [address]             [phone]        SLADEN PLANCARTE [facility] Medical Record #[id number] Date of Birth: 1945-12-23  Referring: Iran Ouch, MD Primary Care: Shade Flood, MD Primary Cardiologist:Muhammad Kirke Corin, MD  Chief Complaint:   Unstable angina  History of Present Illness:   We are asked to see this 78 year old male with history of hypertension, PVCs, diastolic dysfunction and hypothyroidism in cardiothoracic surgical consultation for consideration of coronary artery surgical revascularization.  He was previously diagnosed with PVCs in 2014.  He underwent cardiology evaluation including echocardiogram which showed at that time with normal left ventricular ejection fraction.  Stress testing showed no evidence of ischemia.  He was initially treated with diltiazem but later developed lower extremity swelling and had been changed to Coreg.  Follow-up echocardiogram in September 2022 showed EF 60 to 65%, mild LVH, grade 1 diastolic dysfunction, mild dilated LA, mild MR/AI, and mild aortic valve sclerosis.  He was recently seen in the cardiology office with complaints of exertional chest pressure and dyspnea.  He was subsequently set up for left heart catheterization which was done on 12/15/2023 and showed moderately calcified arteries with severe three-vessel CAD.  Due to the severity of these findings he was felt to require transfer to [facility] for CT surgical evaluation/possible CABG.  Cardiac risk factors also include hyperlipidemia.  He has no previous history of tobacco abuse.  Repeat echocardiogram is pending.    Current Activity/ Functional Status: Patient was independent with mobility/ambulation, transfers, ADL's, IADL's.   Zubrod Score: At the time of surgery this patient's most appropriate activity status/level should be described as: []     0    Normal activity, no symptoms [x]     1    Restricted in physical  strenuous activity but ambulatory, able to do out light work []     2    Ambulatory and capable of self care, unable to do work activities, up and about                 more than 50%  Of the time                            []     3    Only limited self care, in bed greater than 50% of waking hours []     4    Completely disabled, no self care, confined to bed or chair []     5    Moribund  Past Medical History:  Diagnosis Date   Allergy    Arthritis    Phreesia 07/21/2020   Diastolic dysfunction    a. 07/2021 Echo: EF 60-65%.  Mild LVH.  Grade 1 DD.  Normal RV fxn. mildly dil LA. Triv MR/AI. Mild AoV sclerosis.   Heart murmur    Hypertension    Hypothyroidism    PVC's (premature ventricular contractions)     Past Surgical History:  Procedure Laterality Date   COLONOSCOPY     HEMORRHOID SURGERY     VASECTOMY      Social History   Tobacco Use  Smoking Status Never  Smokeless Tobacco Never    Social History   Substance and Sexual Activity  Alcohol Use Yes   Comment: occasionally     No Known Allergies  Current  Facility-Administered Medications  Medication Dose Route Frequency Provider Last Rate Last Admin   0.9 %  sodium chloride infusion  250 mL Intravenous PRN Furth, Cadence H, PA-C       acetaminophen (TYLENOL) tablet 650 mg  650 mg Oral Q4H PRN Sondra Barges A, MD       aspirin EC tablet 81 mg  81 mg Oral Daily Furth, Cadence H, PA-C       atorvastatin (LIPITOR) tablet 40 mg  40 mg Oral Daily Furth, Cadence H, PA-C       carvedilol (COREG) tablet 6.25 mg  6.25 mg Oral BID Furth, Cadence H, PA-C       heparin ADULT infusion 100 units/mL (25000 units/263mL)  1,400 Units/hr Intravenous Continuous Rollene Fare, RPH 14 mL/hr at 12/17/23 0524 1,400 Units/hr at 12/17/23 0524   isosorbide mononitrate (IMDUR) 24 hr tablet 30 mg  30 mg Oral Daily Furth, Cadence H, PA-C       levothyroxine (SYNTHROID) tablet 100 mcg  100 mcg Oral Daily Furth, Cadence H, PA-C   100 mcg at  12/17/23 0517   nitroGLYCERIN (NITROSTAT) SL tablet 0.4 mg  0.4 mg Sublingual Q5 Min x 3 PRN Furth, Cadence H, PA-C       ondansetron (ZOFRAN) injection 4 mg  4 mg Intravenous Q6H PRN Derrell Lolling, Damarcus A, MD       sodium chloride flush (NS) 0.9 % injection 3 mL  3 mL Intravenous Q12H Furth, Cadence H, PA-C   3 mL at 12/16/23 2109   sodium chloride flush (NS) 0.9 % injection 3 mL  3 mL Intravenous PRN Furth, Cadence H, PA-C        Medications Prior to Admission  Medication Sig Dispense Refill Last Dose/Taking   aspirin 81 MG tablet Take 81 mg by mouth daily.      atorvastatin (LIPITOR) 40 MG tablet Take 1 tablet (40 mg total) by mouth daily. 30 tablet 6    carvedilol (COREG) 6.25 MG tablet Take 1 tablet (6.25 mg total) by mouth 2 (two) times daily. 180 tablet 3    cholecalciferol (VITAMIN D) 1000 UNITS tablet Take 1,000 Units by mouth daily.      isosorbide mononitrate (IMDUR) 30 MG 24 hr tablet Take 1 tablet (30 mg total) by mouth daily. 90 tablet 3    levothyroxine (SYNTHROID) 100 MCG tablet Take 1 tablet (100 mcg total) by mouth daily. 90 tablet 3    losartan-hydrochlorothiazide (HYZAAR) 100-25 MG tablet Take 1 tablet by mouth daily. 90 tablet 3    Multiple Vitamin (MULTIVITAMIN) tablet Take 1 tablet by mouth daily.      nitroGLYCERIN (NITROSTAT) 0.4 MG SL tablet Place 1 tablet (0.4 mg total) under the tongue every 5 (five) minutes as needed for chest pain. 25 tablet 1    PREVIDENT 5000 BOOSTER PLUS 1.1 % PSTE Place 1 application  onto teeth at bedtime.      triamcinolone cream (KENALOG) 0.1 % APPLY 1 APPLICATION TOPICALLY 2 TIMES DAILY. (Patient taking differently: Apply 1 Application topically daily as needed (irritation).) 30 g 3    vitamin E 1000 UNIT capsule Take 1,000 Units by mouth daily.       Family History  Problem Relation Age of Onset   Heart disease Mother    Cancer Father    Diabetes Father    Stroke Maternal Grandmother    Diabetes Maternal Grandmother    Colon cancer  Neg Hx    Esophageal cancer Neg Hx  Rectal cancer Neg Hx    Stomach cancer Neg Hx      Review of Systems:   Review of Systems  Constitutional:  Positive for malaise/fatigue.  Eyes:        Has cataracts  Cardiovascular:  Positive for chest pain, palpitations and leg swelling.  Gastrointestinal:        H/o hemorrhoids- no recent issues  Genitourinary:        Nocturia  Musculoskeletal:  Positive for joint pain.       Left shoulder likely rotator cuff injury with decreased ROM     Physical Exam: BP (!) 140/80 (BP Location: Right Arm)   Pulse 60   Temp 99 F (37.2 C) (Oral)   Resp 18   Ht 5\' 9"  (1.753 m)   Wt 92 kg   SpO2 94%   BMI 29.95 kg/m    General appearance: alert, cooperative, and no distress Head: Normocephalic, without obvious abnormality, atraumatic Neck: no adenopathy, no carotid bruit, no JVD, supple, symmetrical, trachea midline, and thyroid not enlarged, symmetric, no tenderness/mass/nodules Lymph nodes: Cervical, supraclavicular, and axillary nodes normal. Resp: clear to auscultation bilaterally Back: symmetric, no curvature. ROM normal. No CVA tenderness. Cardio: regular rate and rhythm, S1, S2 normal, no murmur, click, rub or gallop GI: soft, non-tender; bowel sounds normal; no masses,  no organomegaly Extremities: previous left anterior tibial (mid) trauma, well healed Neurologic: Grossly normal  Diagnostic Studies & Laboratory data:     Recent Radiology Findings:   No results found.   I have independently reviewed the above radiologic studies and discussed with the patient   Recent Lab Findings: Lab Results  Component Value Date   WBC 5.3 12/17/2023   HGB 12.7 (L) 12/17/2023   HCT 37.1 (L) 12/17/2023   PLT 149 (L) 12/17/2023   GLUCOSE 105 (H) 12/17/2023   CHOL 152 08/04/2023   TRIG 97.0 08/04/2023   HDL 42.00 08/04/2023   LDLCALC 91 08/04/2023   ALT 15 08/04/2023   AST 18 08/04/2023   NA 137 12/17/2023   K 3.1 (L) 12/17/2023   CL  102 12/17/2023   CREATININE 0.85 12/17/2023   BUN 16 12/17/2023   CO2 26 12/17/2023   TSH 4.40 08/04/2023   INR 1.1 12/15/2023   HGBA1C 5.3 07/28/2021      Assessment / Plan: Severe three-vessel coronary artery disease with progressive angina. History of PVCs History of hypothyroidism Hypertension Arthritis  Plan: Patient appears to be a suitable candidate to proceed with CABG.  Surgeon will evaluate the patient and all relevant studies to determine timing and final decision as to candidacy.    I  spent 40 minutes counseling the patient face to face.     Rowe Clack, PA-C  12/17/2023 10:40 AM   Chart reviewed, patient examined, agree with above.  This 78 year old gentleman present with a 3 wk history of exertional chest discomfort radiating into the left shoulder and arm, relieved with rest. Cath showed severe 3V coronary artery disease with moderately calcified vessels and high grade bifurcation stenoses in the LAD and LCX. LVEF normal with no significant valvular disease. I agree that CABG is the best treatment for him. I discussed the operative procedure with the patient and his wife and two daughters including alternatives, benefits and risks; including but not limited to bleeding, blood transfusion, infection, stroke, myocardial infarction, graft failure, heart block requiring a permanent pacemaker, organ dysfunction, and death.  [name] understands and agrees to proceed.  Plan CABG in am tomorrow.

## 2023-12-17 NOTE — Progress Notes (Signed)
   Progress Note  Patient Name: Terry Vega Date of Encounter: 12/17/2023  Primary Cardiologist: Lorine Bears, MD  Subjective   No acute events overnight.  Chest pain-free.  Inpatient Medications    Scheduled Meds:  aspirin EC  81 mg Oral Daily   atorvastatin  40 mg Oral Daily   carvedilol  6.25 mg Oral BID   isosorbide mononitrate  30 mg Oral Daily   levothyroxine  100 mcg Oral Daily   sodium chloride flush  3 mL Intravenous Q12H   Continuous Infusions:  sodium chloride     heparin 1,400 Units/hr (12/17/23 0524)   PRN Meds: sodium chloride, acetaminophen, nitroGLYCERIN, ondansetron (ZOFRAN) IV, sodium chloride flush   Vital Signs    Vitals:   12/16/23 2357 12/17/23 0423 12/17/23 0500 12/17/23 0700  BP: 132/69 130/86  (!) 140/80  Pulse: 68 66  60  Resp: 17 17  18   Temp: 98 F (36.7 C) 98.1 F (36.7 C)  99 F (37.2 C)  TempSrc: Oral Oral  Oral  SpO2: 97% 94%  94%  Weight:   92 kg   Height:       No intake or output data in the 24 hours ending 12/17/23 1025 Filed Weights   12/17/23 0500  Weight: 92 kg    Telemetry     Personally reviewed.  NSR  ECG    Not performed today  Physical Exam   GEN: No acute distress.   Neck: No JVD. Cardiac: RRR, no murmur, rub, or gallop.  Respiratory: Nonlabored. Clear to auscultation bilaterally. GI: Soft, nontender, bowel sounds present. MS: No edema; No deformity. Neuro:  Nonfocal. Psych: Alert and oriented x 3. Normal affect.  Labs    Chemistry Recent Labs  Lab 12/17/23 0241  NA 137  K 3.1*  CL 102  CO2 26  GLUCOSE 105*  BUN 16  CREATININE 0.85  CALCIUM 9.2  GFRNONAA >60  ANIONGAP 9     Hematology Recent Labs  Lab 12/15/23 2031 12/17/23 0241  WBC 7.1 5.3  RBC 4.62 4.11*  HGB 14.4 12.7*  HCT 42.2 37.1*  MCV 91.3 90.3  MCH 31.2 30.9  MCHC 34.1 34.2  RDW 12.6 12.4  PLT 174 149*    Cardiac EnzymesNo results for input(s): "TROPONINIHS" in the last 720 hours.  BNPNo results for  input(s): "BNP", "PROBNP" in the last 168 hours.   DDimerNo results for input(s): "DDIMER" in the last 168 hours.   Radiology    No results found.   Assessment & Plan   CAD manifested by unstable angina s/p multivessel CAD: Transfer to [facility] yesterday for CABG evaluation.  CT surgery was consulted, pending recommendations.  Echocardiogram is pending.  Continue aspirin 81 mg once daily, atorvastatin 40 mg nightly and heparin drip.  HTN, controlled: Continue current antihypertensives, carvedilol 6.25 mg twice daily, Imdur 30 mg once daily.  Frequent PVCs, controlled: Continue carvedilol 6.25 mg twice daily.  Signed, Marjo Bicker, MD  12/17/2023, 10:25 AM

## 2023-12-17 NOTE — Progress Notes (Signed)
Echocardiogram 2D Echocardiogram has been performed.  Terry Vega 12/17/2023, 3:07 PM

## 2023-12-17 NOTE — Progress Notes (Signed)
PHARMACY - ANTICOAGULATION CONSULT NOTE  Pharmacy Consult for heparin infusion Indication: chest pain/ACS  No Known Allergies  Patient Measurements: Height: 5\' 9"  (175.3 cm) Weight: 92 kg (202 lb 13.2 oz) IBW/kg (Calculated) : 70.7 Heparin Dosing Weight: 89.7 kg  Vital Signs: Temp: 99 F (37.2 C) (02/02 0700) Temp Source: Oral (02/02 0700) BP: 140/80 (02/02 0700) Pulse Rate: 60 (02/02 0700)  Labs: Recent Labs    12/15/23 2031 12/16/23 0148 12/16/23 1057 12/16/23 1906 12/17/23 0241  HGB 14.4  --   --   --  12.7*  HCT 42.2  --   --   --  37.1*  PLT 174  --   --   --  149*  APTT 40*  --   --   --   --   LABPROT 14.5  --   --   --   --   INR 1.1  --   --   --   --   HEPARINUNFRC  --    < > 0.46 0.53 0.40  CREATININE  --   --   --   --  0.85   < > = values in this interval not displayed.    Estimated Creatinine Clearance: 80.2 mL/min (by C-G formula based on SCr of 0.85 mg/dL).   Medical History: Past Medical History:  Diagnosis Date   Allergy    Arthritis    Phreesia 07/21/2020   Diastolic dysfunction    a. 07/2021 Echo: EF 60-65%.  Mild LVH.  Grade 1 DD.  Normal RV fxn. mildly dil LA. Triv MR/AI. Mild AoV sclerosis.   Heart murmur    Hypertension    Hypothyroidism    PVC's (premature ventricular contractions)     Medications:  Scheduled:   aspirin EC  81 mg Oral Daily   atorvastatin  40 mg Oral Daily   carvedilol  6.25 mg Oral BID   isosorbide mononitrate  30 mg Oral Daily   levothyroxine  100 mcg Oral Daily   sodium chloride flush  3 mL Intravenous Q12H   Infusions:   sodium chloride     heparin 1,400 Units/hr (12/17/23 0524)    Assessment: 78 y.o. male with is presented to [facility] with unstable angina, with potential need for CABG. Not on anticoagulation PTA. In anticipation of that procedure, pharmacy has been consulted to initiate and manage heparin intravenous infusion.   Heparin level 0.40 is therapeutic with heparin running at 1400 units/hr.  Hgb (12.7) and PLTs (149) are have trended down slightly. Per RN, no report of pauses, issues with the line, or signs of bleeding.   Goal of Therapy:  Heparin level 0.3-0.7 units/ml Monitor platelets by anticoagulation protocol: Yes   Plan:  Continue heparin drip at 1400 units/hr Monitor daily heparin levels and CBC Monitor for signs/symptoms of bleeding F/u TCTS consult/recommendations  Ernestene Kiel, PharmD PGY1 Pharmacy Resident  Please check AMION for all [facility] Pharm[facility] phone numbers After 10:00 PM, call Main Pharmacy [phone] 12/17/2023,8:27 AM

## 2023-12-17 NOTE — Plan of Care (Signed)

## 2023-12-17 NOTE — Progress Notes (Signed)
   Patient Name: Terry Vega Date of Encounter: 12/17/2023 [facility] Cardiologist: Lorine Bears, MD   Interval Summary  .    Transferred overnight from [facility] for CABG eval. Reports doing relatively well. No CP, SOB, or edema currently. No new or acute complaints.   Vital Signs .    Vitals:   12/16/23 2357 12/17/23 0423 12/17/23 0500 12/17/23 0700  BP: 132/69 130/86  (!) 140/80  Pulse: 68 66  60  Resp: 17 17  18   Temp: 98 F (36.7 C) 98.1 F (36.7 C)  99 F (37.2 C)  TempSrc: Oral Oral  Oral  SpO2: 97% 94%  94%  Weight:   92 kg   Height:       No intake or output data in the 24 hours ending 12/17/23 0913    12/17/2023    5:00 AM 12/15/2023    8:00 AM 12/07/2023    2:24 PM  Last 3 Weights  Weight (lbs) 202 lb 13.2 oz 204 lb 9.6 oz 213 lb  Weight (kg) 92 kg 92.806 kg 96.616 kg      Telemetry/ECG    Sinus rhythm. No ventricular arrhythmias. - Personally Reviewed  Physical Exam .   GEN: No acute distress.   Neck: No JVD Cardiac: Normal rate and regular rhythm Respiratory: Clear to auscultation bilaterally. GI: Soft, nontender, non-distended  MS: No edema  Assessment & Plan .     Terry Vega is a 78 y.o. male with h/o HTN, PVCs, diastolic dysfunction, lower extremity edema and hypothyroidism who was seen at [facility] on 1/31 for unstable angina, found to have 3 vessel coronary artery disease. He was Transferred to [facility] on 2/1 for CABG evaluation.  #Unstable angina: Severe 3 vessel CAD. No chest pain currently. -Awaiting CABG evaluation. Will ensure CTS has been notified.  -Echo pending.  -Continue aspirin, atorvastatin. -Continue carvedilol, Imdur. -Continue heparin.   #Hypertension -Holding home hydrochlorothiazide. -Continue carvedilol, imdur, losartan.   #PVCs: Asymptomatic -Continue carvedilol.  For questions or updates, please contact [facility] Please consult [email or web address] for contact info under         Signed, Nobie Putnam, MD

## 2023-12-18 ENCOUNTER — Inpatient Hospital Stay (HOSPITAL_COMMUNITY): Payer: Medicare HMO

## 2023-12-18 ENCOUNTER — Encounter: Payer: Self-pay | Admitting: Cardiovascular Disease

## 2023-12-18 DIAGNOSIS — Z01810 Encounter for preprocedural cardiovascular examination: Secondary | ICD-10-CM

## 2023-12-18 DIAGNOSIS — I200 Unstable angina: Secondary | ICD-10-CM | POA: Diagnosis not present

## 2023-12-18 DIAGNOSIS — I10 Essential (primary) hypertension: Secondary | ICD-10-CM | POA: Diagnosis not present

## 2023-12-18 LAB — BASIC METABOLIC PANEL
Anion gap: 9 (ref 5–15)
BUN: 17 mg/dL (ref 8–23)
CO2: 25 mmol/L (ref 22–32)
Calcium: 9.3 mg/dL (ref 8.9–10.3)
Chloride: 106 mmol/L (ref 98–111)
Creatinine, Ser: 1.02 mg/dL (ref 0.61–1.24)
GFR, Estimated: >60 mL/min (ref >60)
Glucose, Bld: 90 mg/dL (ref 70–99)
Potassium: 3.8 mmol/L (ref 3.5–5.1)
Sodium: 140 mmol/L (ref 135–145)

## 2023-12-18 LAB — BLOOD GAS, ARTERIAL
Acid-Base Excess: 5.2 mmol/L — ABNORMAL HIGH (ref 0.0–2.0)
Bicarbonate: 29.9 mmol/L — ABNORMAL HIGH (ref 20.0–28.0)
O2 Saturation: 97.2 %
Patient temperature: 37
pCO2 arterial: 43 mm[Hg] (ref 32–48)
pH, Arterial: 7.45 (ref 7.35–7.45)
pO2, Arterial: 79 mm[Hg] — ABNORMAL LOW (ref 83–108)

## 2023-12-18 LAB — CBC
HCT: 37 % — ABNORMAL LOW (ref 39.0–52.0)
Hemoglobin: 12.6 g/dL — ABNORMAL LOW (ref 13.0–17.0)
MCH: 31 pg (ref 26.0–34.0)
MCHC: 34.1 g/dL (ref 30.0–36.0)
MCV: 90.9 fL (ref 80.0–100.0)
Platelets: 150 10*3/uL (ref 150–400)
RBC: 4.07 MIL/uL — ABNORMAL LOW (ref 4.22–5.81)
RDW: 12.4 % (ref 11.5–15.5)
WBC: 6.2 10*3/uL (ref 4.0–10.5)
nRBC: 0 % (ref 0.0–0.2)

## 2023-12-18 LAB — TYPE AND SCREEN
ABO/RH(D): O POS
Antibody Screen: NEGATIVE

## 2023-12-18 LAB — COMPREHENSIVE METABOLIC PANEL
ALT: 26 U/L (ref 0–44)
AST: 31 U/L (ref 15–41)
Albumin: 3.4 g/dL — ABNORMAL LOW (ref 3.5–5.0)
Alkaline Phosphatase: 29 U/L — ABNORMAL LOW (ref 38–126)
Anion gap: 10 (ref 5–15)
BUN: 17 mg/dL (ref 8–23)
CO2: 25 mmol/L (ref 22–32)
Calcium: 9 mg/dL (ref 8.9–10.3)
Chloride: 102 mmol/L (ref 98–111)
Creatinine, Ser: 0.93 mg/dL (ref 0.61–1.24)
GFR, Estimated: >60 mL/min (ref >60)
Glucose, Bld: 104 mg/dL — ABNORMAL HIGH (ref 70–99)
Potassium: 3.6 mmol/L (ref 3.5–5.1)
Sodium: 137 mmol/L (ref 135–145)
Total Bilirubin: 0.7 mg/dL (ref 0.0–1.2)
Total Protein: 6 g/dL — ABNORMAL LOW (ref 6.5–8.1)

## 2023-12-18 LAB — HEMOGLOBIN A1C
Hgb A1c MFr Bld: 5.2 % (ref 4.8–5.6)
Mean Plasma Glucose: 102.54 mg/dL

## 2023-12-18 LAB — MAGNESIUM: Magnesium: 2 mg/dL (ref 1.7–2.4)

## 2023-12-18 LAB — VAS US DOPPLER PRE CABG

## 2023-12-18 LAB — LIPOPROTEIN A (LPA)
Lipoprotein (a): 199.8 nmol/L — ABNORMAL HIGH (ref <75.0)
Lipoprotein (a): 175.9 nmol/L — ABNORMAL HIGH (ref <75.0)

## 2023-12-18 LAB — URINALYSIS, ROUTINE W REFLEX MICROSCOPIC
Bilirubin Urine: NEGATIVE
Glucose, UA: NEGATIVE mg/dL
Hgb urine dipstick: NEGATIVE
Ketones, ur: NEGATIVE mg/dL
Leukocytes,Ua: NEGATIVE
Nitrite: NEGATIVE
Protein, ur: NEGATIVE mg/dL
Specific Gravity, Urine: 1.008 (ref 1.005–1.030)
pH: 7 (ref 5.0–8.0)

## 2023-12-18 LAB — SURGICAL PCR SCREEN
MRSA, PCR: NEGATIVE
Staphylococcus aureus: NEGATIVE

## 2023-12-18 LAB — HEPARIN LEVEL (UNFRACTIONATED): Heparin Unfractionated: 0.47 [IU]/mL (ref 0.30–0.70)

## 2023-12-18 LAB — ABO/RH: ABO/RH(D): O POS

## 2023-12-18 MED ORDER — METHADONE HCL IV SYRINGE 10 MG/ML FOR CABG
0.2000 mg/kg | Freq: Once | INTRAMUSCULAR | Status: AC
  Administered 2023-12-19: 14 mg via INTRAVENOUS
  Filled 2023-12-18: qty 1.4

## 2023-12-18 MED ORDER — CHLORHEXIDINE GLUCONATE CLOTH 2 % EX PADS
6.0000 | MEDICATED_PAD | Freq: Once | CUTANEOUS | Status: AC
  Administered 2023-12-19: 6 via TOPICAL

## 2023-12-18 MED ORDER — METOPROLOL TARTRATE 12.5 MG HALF TABLET
12.5000 mg | ORAL_TABLET | Freq: Once | ORAL | Status: AC
  Administered 2023-12-19: 12.5 mg via ORAL
  Filled 2023-12-18: qty 1

## 2023-12-18 MED ORDER — CEFAZOLIN SODIUM-DEXTROSE 2-4 GM/100ML-% IV SOLN
2.0000 g | INTRAVENOUS | Status: DC
  Filled 2023-12-18: qty 100

## 2023-12-18 MED ORDER — CEFAZOLIN SODIUM-DEXTROSE 2-4 GM/100ML-% IV SOLN
2.0000 g | INTRAVENOUS | Status: AC
  Administered 2023-12-19 (×2): 2 g via INTRAVENOUS
  Filled 2023-12-18: qty 100

## 2023-12-18 MED ORDER — SODIUM CHLORIDE 0.9 % IV SOLN
1.5000 mg/kg/h | INTRAVENOUS | Status: AC
  Administered 2023-12-19: 1.5 mg/kg/h via INTRAVENOUS
  Filled 2023-12-18: qty 25

## 2023-12-18 MED ORDER — BISACODYL 5 MG PO TBEC
5.0000 mg | DELAYED_RELEASE_TABLET | Freq: Once | ORAL | Status: AC
  Administered 2023-12-18: 5 mg via ORAL
  Filled 2023-12-18: qty 1

## 2023-12-18 MED ORDER — PLASMA-LYTE A IV SOLN
INTRAVENOUS | Status: DC
  Filled 2023-12-18: qty 2.5

## 2023-12-18 MED ORDER — MAGNESIUM SULFATE 50 % IJ SOLN
40.0000 meq | INTRAMUSCULAR | Status: DC
  Filled 2023-12-18: qty 9.85

## 2023-12-18 MED ORDER — EPINEPHRINE HCL 5 MG/250ML IV SOLN IN NS
0.0000 ug/min | INTRAVENOUS | Status: DC
  Filled 2023-12-18: qty 250

## 2023-12-18 MED ORDER — TEMAZEPAM 7.5 MG PO CAPS
15.0000 mg | ORAL_CAPSULE | Freq: Once | ORAL | Status: AC | PRN
  Administered 2023-12-18: 15 mg via ORAL
  Filled 2023-12-18: qty 2

## 2023-12-18 MED ORDER — NOREPINEPHRINE 4 MG/250ML-% IV SOLN
0.0000 ug/min | INTRAVENOUS | Status: DC
  Filled 2023-12-18: qty 250

## 2023-12-18 MED ORDER — PHENYLEPHRINE HCL-NACL 20-0.9 MG/250ML-% IV SOLN
30.0000 ug/min | INTRAVENOUS | Status: AC
  Administered 2023-12-19: 25 ug/min via INTRAVENOUS
  Filled 2023-12-18: qty 250

## 2023-12-18 MED ORDER — DIAZEPAM 2 MG PO TABS
2.0000 mg | ORAL_TABLET | Freq: Once | ORAL | Status: AC
  Administered 2023-12-19: 2 mg via ORAL
  Filled 2023-12-18: qty 1

## 2023-12-18 MED ORDER — CHLORHEXIDINE GLUCONATE CLOTH 2 % EX PADS
6.0000 | MEDICATED_PAD | Freq: Once | CUTANEOUS | Status: AC
  Administered 2023-12-18: 6 via TOPICAL

## 2023-12-18 MED ORDER — TRANEXAMIC ACID (OHS) PUMP PRIME SOLUTION
2.0000 mg/kg | INTRAVENOUS | Status: DC
  Filled 2023-12-18: qty 1.83

## 2023-12-18 MED ORDER — NITROGLYCERIN IN D5W 200-5 MCG/ML-% IV SOLN
2.0000 ug/min | INTRAVENOUS | Status: DC
  Filled 2023-12-18: qty 250

## 2023-12-18 MED ORDER — DEXMEDETOMIDINE HCL IN NACL 400 MCG/100ML IV SOLN
0.1000 ug/kg/h | INTRAVENOUS | Status: AC
  Administered 2023-12-19: .4 ug/kg/h via INTRAVENOUS
  Filled 2023-12-18: qty 100

## 2023-12-18 MED ORDER — POTASSIUM CHLORIDE 2 MEQ/ML IV SOLN
80.0000 meq | INTRAVENOUS | Status: DC
  Filled 2023-12-18: qty 40

## 2023-12-18 MED ORDER — CHLORHEXIDINE GLUCONATE 0.12 % MT SOLN
15.0000 mL | Freq: Once | OROMUCOSAL | Status: AC
  Administered 2023-12-19: 15 mL via OROMUCOSAL
  Filled 2023-12-18: qty 15

## 2023-12-18 MED ORDER — TRANEXAMIC ACID (OHS) BOLUS VIA INFUSION
15.0000 mg/kg | INTRAVENOUS | Status: AC
  Administered 2023-12-19: 1372.5 mg via INTRAVENOUS
  Filled 2023-12-18: qty 1373

## 2023-12-18 MED ORDER — SODIUM CHLORIDE 0.9 % IV SOLN
1500.0000 mg | INTRAVENOUS | Status: AC
  Administered 2023-12-19: 1500 mg via INTRAVENOUS
  Filled 2023-12-18: qty 30

## 2023-12-18 MED ORDER — MILRINONE LACTATE IN DEXTROSE 20-5 MG/100ML-% IV SOLN
0.3000 ug/kg/min | INTRAVENOUS | Status: DC
  Filled 2023-12-18: qty 100

## 2023-12-18 MED ORDER — INSULIN REGULAR(HUMAN) IN NACL 100-0.9 UT/100ML-% IV SOLN
INTRAVENOUS | Status: AC
  Administered 2023-12-19: 1.5 [IU]/h via INTRAVENOUS
  Filled 2023-12-18: qty 100

## 2023-12-18 MED ORDER — HEPARIN 30,000 UNITS/1000 ML (OHS) CELLSAVER SOLUTION
Status: DC
  Filled 2023-12-18: qty 1000

## 2023-12-18 NOTE — Progress Notes (Signed)
PHARMACY - ANTICOAGULATION CONSULT NOTE  Pharmacy Consult for heparin infusion Indication: chest pain/ACS  No Known Allergies  Patient Measurements: Height: 5\' 9"  (175.3 cm) Weight: 91.5 kg (201 lb 12.8 oz) IBW/kg (Calculated) : 70.7 Heparin Dosing Weight: 89.7 kg  Vital Signs: Temp: 98.2 F (36.8 C) (02/03 0725) Temp Source: Oral (02/03 0725) BP: 144/86 (02/03 0725) Pulse Rate: 64 (02/03 0725)  Labs: Recent Labs    12/15/23 2031 12/16/23 0148 12/16/23 1906 12/17/23 0241 12/18/23 0247  HGB 14.4  --   --  12.7* 12.6*  HCT 42.2  --   --  37.1* 37.0*  PLT 174  --   --  149* 150  APTT 40*  --   --   --   --   LABPROT 14.5  --   --   --   --   INR 1.1  --   --   --   --   HEPARINUNFRC  --    < > 0.53 0.40 0.47  CREATININE  --   --   --  0.85  --    < > = values in this interval not displayed.    Estimated Creatinine Clearance: 80 mL/min (by C-G formula based on SCr of 0.85 mg/dL).   Medical History: Past Medical History:  Diagnosis Date   Allergy    Arthritis    Phreesia 07/21/2020   Diastolic dysfunction    a. 07/2021 Echo: EF 60-65%.  Mild LVH.  Grade 1 DD.  Normal RV fxn. mildly dil LA. Triv MR/AI. Mild AoV sclerosis.   Heart murmur    Hypertension    Hypothyroidism    PVC's (premature ventricular contractions)     Medications:  Scheduled:   aspirin EC  81 mg Oral Daily   atorvastatin  40 mg Oral Daily   carvedilol  6.25 mg Oral BID   isosorbide mononitrate  30 mg Oral Daily   levothyroxine  100 mcg Oral Daily   sodium chloride flush  3 mL Intravenous Q12H   Infusions:   heparin 1,400 Units/hr (12/18/23 0829)    Assessment: 78 y.o. male with is presented to [facility] with unstable angina, with potential need for CABG. Not on anticoagulation PTA. In anticipation of that procedure, pharmacy has been consulted to initiate and manage heparin intravenous infusion.   Heparin level 0.47 is therapeutic with heparin running at 1400 units/hr. Hg 12/6 with slow  trend down  Goal of Therapy:  Heparin level 0.3-0.7 units/ml Monitor platelets by anticoagulation protocol: Yes   Plan:  Continue heparin drip at 1400 units/hr Monitor daily heparin levels and CBC Plans noted for CABG on 2/4  Harland German, PharmD Clinical Pharmacist **Pharmacist phone directory can now be found on [email or web address] ([email or web address]).  Listed under [facility].

## 2023-12-18 NOTE — Progress Notes (Signed)
CARDIAC REHAB PHASE I      Pre-op OHS education including OHS booklet, OHS handout, IS use, mobility importance, home needs at discharge and sternal precautions/move in the tube reviewed. All questions and concerns addressed. Will continue to follow.  [id number] Woodroe Chen, RN BSN 12/18/2023 10:18 AM

## 2023-12-18 NOTE — Progress Notes (Signed)
   Patient Name: Terry Vega Date of Encounter: 12/18/2023 [facility] Cardiologist: Lorine Bears, MD   Interval Summary  .    No chest pain  Vital Signs .    Vitals:   12/17/23 2025 12/18/23 0050 12/18/23 0414 12/18/23 0725  BP: 126/66 123/68 127/66 (!) 144/86  Pulse: (!) 59 67 (!) 58 64  Resp: 18 17 18 20   Temp: 97.9 F (36.6 C) 98 F (36.7 C) 98.3 F (36.8 C) 98.2 F (36.8 C)  TempSrc: Oral Oral Oral Oral  SpO2: 94% 95% 94% 94%  Weight:   91.5 kg   Height:        Intake/Output Summary (Last 24 hours) at 12/18/2023 0957 Last data filed at 12/18/2023 4098 Gross per 24 hour  Intake 1817.42 ml  Output 2025 ml  Net -207.58 ml      12/18/2023    4:14 AM 12/17/2023    5:00 AM 12/15/2023    8:00 AM  Last 3 Weights  Weight (lbs) 201 lb 12.8 oz 202 lb 13.2 oz 204 lb 9.6 oz  Weight (kg) 91.536 kg 92 kg 92.806 kg      Telemetry/ECG    Personally Reviewed Independently interpreted EKG 12/18/2023: Sinus rhythm 58 bpm 1st degree A-V block Minimal voltage criteria for LVH  Telemetry 12/18/2023: No significant arrhythmia noted.  Independently interpreted Echocardiogram 12/17/2023: LVEF 60-65%. Grade 1 DD.  Mild RV enlargement, normal systolic function. Mild AI.  Independently interpreted Cath 12/15/2023: Severe multivessel CAD LAD/diag, Lcx/OM, dRCA   Physical Exam .   Physical Exam Vitals and nursing note reviewed.  Constitutional:      General: He is not in acute distress. Neck:     Vascular: No JVD.  Cardiovascular:     Rate and Rhythm: Normal rate and regular rhythm.     Heart sounds: Normal heart sounds. No murmur heard. Pulmonary:     Effort: Pulmonary effort is normal.     Breath sounds: Normal breath sounds. No wheezing or rales.  Musculoskeletal:     Right lower leg: No edema.     Left lower leg: No edema.      Assessment & Plan .     78 y/o male w/hypertension, unstable agnina  Unstable angina: Currently chest pain free. Severe  multivessel CAD (Cath 12/15/2023). Awaiting CABG 2/4. Continue Aspirin, heparin. Continue Lipitor 40 mg daily, coreg 3.125 mg bid, Imdur 30 mg daily.  Hypertension: Fairly well controlled.   For questions or updates, please contact [facility] Please consult [email or web address] for contact info under        Signed, Elder Negus, MD

## 2023-12-18 NOTE — Progress Notes (Signed)
Patient is from home, had cardiac cath done with abnormal results. Patient expected to have CABG done on Tuesday. No TOC needs noted at this time.

## 2023-12-18 NOTE — Anesthesia Preprocedure Evaluation (Signed)
Anesthesia Evaluation  Patient identified by MRN, date of birth, ID band Patient awake    Reviewed: Allergy & Precautions, NPO status , Patient's Chart, lab work & pertinent test results  Airway Mallampati: II  TM Distance: >3 FB Neck ROM: Full    Dental no notable dental hx.    Pulmonary neg pulmonary ROS   Pulmonary exam normal        Cardiovascular hypertension, Pt. on medications and Pt. on home beta blockers + CAD   Rhythm:Regular Rate:Normal  ECHO:  1. Left ventricular ejection fraction, by estimation, is 60 to 65%. The left ventricle has normal function. The left ventricle has no regional wall motion abnormalities. Left ventricular diastolic parameters are consistent with Grade I diastolic dysfunction (impaired relaxation).  2. Right ventricular systolic function is normal. The right ventricular size is mildly enlarged.  3. The mitral valve is normal in structure. No evidence of mitral valve regurgitation. No evidence of mitral stenosis.  4. The aortic valve is tricuspid. There is mild calcification of the aortic valve. Aortic valve regurgitation is mild. No aortic stenosis is present. Aortic regurgitation PHT measures 678 msec.  5. Aortic dilatation noted. There is borderline dilatation of the aortic root, measuring 36 mm. There is borderline dilatation of the ascending aorta, measuring 38 mm.    Neuro/Psych negative neurological ROS  negative psych ROS   GI/Hepatic negative GI ROS, Neg liver ROS,,,  Endo/Other  Hypothyroidism    Renal/GU   negative genitourinary   Musculoskeletal  (+) Arthritis ,    Abdominal Normal abdominal exam  (+)   Peds  Hematology Lab Results      Component                Value               Date                      WBC                      6.2                 12/18/2023                HGB                      12.6 (L)            12/18/2023                HCT                       37.0 (L)            12/18/2023                MCV                      90.9                12/18/2023                PLT                      150                 12/18/2023  Lab Results      Component                Value               Date                      NA                       137                 12/18/2023                K                        3.6                 12/18/2023                CO2                      25                  12/18/2023                GLUCOSE                  104 (H)             12/18/2023                BUN                      17                  12/18/2023                CREATININE               0.93                12/18/2023                CALCIUM                  9.0                 12/18/2023                GFR                      82.86               08/04/2023                EGFR                     81                  12/07/2023                GFRNONAA                 >60                 12/18/2023              Anesthesia Other Findings   Reproductive/Obstetrics  Anesthesia Physical Anesthesia Plan  ASA: 4  Anesthesia Plan: General   Post-op Pain Management:    Induction: Intravenous  PONV Risk Score and Plan: 2 and Ondansetron and Treatment may vary due to age or medical condition  Airway Management Planned: Mask and Oral ETT  Additional Equipment: Arterial line, CVP, PA Cath, TEE, 3D TEE and Ultrasound Guidance Line Placement  Intra-op Plan:   Post-operative Plan: Post-operative intubation/ventilation  Informed Consent: I have reviewed the patients History and Physical, chart, labs and discussed the procedure including the risks, benefits and alternatives for the proposed anesthesia with the patient or authorized representative who has indicated his/her understanding and acceptance.     Dental advisory given  Plan Discussed with: CRNA  Anesthesia Plan Comments:         Anesthesia Quick Evaluation

## 2023-12-18 NOTE — Progress Notes (Signed)
VASCULAR LAB    Pre CABG Dopplers have been performed.  See CV proc for preliminary results.   Kyriakos Babler, RVT 12/18/2023, 11:39 AM

## 2023-12-19 ENCOUNTER — Encounter (HOSPITAL_COMMUNITY): Payer: Self-pay | Admitting: Cardiology

## 2023-12-19 ENCOUNTER — Inpatient Hospital Stay (HOSPITAL_COMMUNITY): Payer: Self-pay | Admitting: Certified Registered Nurse Anesthetist

## 2023-12-19 ENCOUNTER — Inpatient Hospital Stay (HOSPITAL_COMMUNITY): Payer: Medicare HMO

## 2023-12-19 ENCOUNTER — Other Ambulatory Visit: Payer: Self-pay

## 2023-12-19 ENCOUNTER — Inpatient Hospital Stay (HOSPITAL_COMMUNITY): Admit: 2023-12-19 | Payer: Medicare HMO | Admitting: Surgery

## 2023-12-19 ENCOUNTER — Inpatient Hospital Stay (HOSPITAL_COMMUNITY)
Admission: EM | Disposition: A | Discharge status: Home / Self Care | Payer: Self-pay | Source: Other Acute Inpatient Hospital | Attending: Surgery

## 2023-12-19 DIAGNOSIS — I2510 Atherosclerotic heart disease of native coronary artery without angina pectoris: Secondary | ICD-10-CM | POA: Diagnosis not present

## 2023-12-19 DIAGNOSIS — I510 Cardiac septal defect, acquired: Secondary | ICD-10-CM | POA: Diagnosis not present

## 2023-12-19 DIAGNOSIS — E039 Hypothyroidism, unspecified: Secondary | ICD-10-CM

## 2023-12-19 DIAGNOSIS — I10 Essential (primary) hypertension: Secondary | ICD-10-CM

## 2023-12-19 DIAGNOSIS — Z951 Presence of aortocoronary bypass graft: Secondary | ICD-10-CM

## 2023-12-19 HISTORY — PX: CORONARY ARTERY BYPASS GRAFT: SHX141

## 2023-12-19 HISTORY — PX: TEE WITHOUT CARDIOVERSION: SHX5443

## 2023-12-19 LAB — POCT I-STAT, CHEM 8
BUN: 16 mg/dL (ref 8–23)
BUN: 13 mg/dL (ref 8–23)
BUN: 16 mg/dL (ref 8–23)
BUN: 13 mg/dL (ref 8–23)
BUN: 13 mg/dL (ref 8–23)
Calcium, Ion: 1.26 mmol/L (ref 1.15–1.40)
Calcium, Ion: 1.01 mmol/L — ABNORMAL LOW (ref 1.15–1.40)
Calcium, Ion: 1.22 mmol/L (ref 1.15–1.40)
Calcium, Ion: 1.07 mmol/L — ABNORMAL LOW (ref 1.15–1.40)
Calcium, Ion: 1.04 mmol/L — ABNORMAL LOW (ref 1.15–1.40)
Chloride: 102 mmol/L (ref 98–111)
Chloride: 102 mmol/L (ref 98–111)
Chloride: 104 mmol/L (ref 98–111)
Chloride: 103 mmol/L (ref 98–111)
Chloride: 103 mmol/L (ref 98–111)
Creatinine, Ser: 0.8 mg/dL (ref 0.61–1.24)
Creatinine, Ser: 0.8 mg/dL (ref 0.61–1.24)
Creatinine, Ser: 0.8 mg/dL (ref 0.61–1.24)
Creatinine, Ser: 0.7 mg/dL (ref 0.61–1.24)
Creatinine, Ser: 0.7 mg/dL (ref 0.61–1.24)
Glucose, Bld: 128 mg/dL — ABNORMAL HIGH (ref 70–99)
Glucose, Bld: 92 mg/dL (ref 70–99)
Glucose, Bld: 96 mg/dL (ref 70–99)
Glucose, Bld: 129 mg/dL — ABNORMAL HIGH (ref 70–99)
Glucose, Bld: 152 mg/dL — ABNORMAL HIGH (ref 70–99)
HCT: 38 % — ABNORMAL LOW (ref 39.0–52.0)
HCT: 26 % — ABNORMAL LOW (ref 39.0–52.0)
HCT: 34 % — ABNORMAL LOW (ref 39.0–52.0)
HCT: 28 % — ABNORMAL LOW (ref 39.0–52.0)
HCT: 27 % — ABNORMAL LOW (ref 39.0–52.0)
Hemoglobin: 12.9 g/dL — ABNORMAL LOW (ref 13.0–17.0)
Hemoglobin: 11.6 g/dL — ABNORMAL LOW (ref 13.0–17.0)
Hemoglobin: 8.8 g/dL — ABNORMAL LOW (ref 13.0–17.0)
Hemoglobin: 9.5 g/dL — ABNORMAL LOW (ref 13.0–17.0)
Hemoglobin: 9.2 g/dL — ABNORMAL LOW (ref 13.0–17.0)
Potassium: 3.5 mmol/L (ref 3.5–5.1)
Potassium: 3.5 mmol/L (ref 3.5–5.1)
Potassium: 4.4 mmol/L (ref 3.5–5.1)
Potassium: 4.4 mmol/L (ref 3.5–5.1)
Potassium: 3.7 mmol/L (ref 3.5–5.1)
Sodium: 141 mmol/L (ref 135–145)
Sodium: 139 mmol/L (ref 135–145)
Sodium: 141 mmol/L (ref 135–145)
Sodium: 140 mmol/L (ref 135–145)
Sodium: 140 mmol/L (ref 135–145)
TCO2: 29 mmol/L (ref 22–32)
TCO2: 26 mmol/L (ref 22–32)
TCO2: 27 mmol/L (ref 22–32)
TCO2: 25 mmol/L (ref 22–32)
TCO2: 27 mmol/L (ref 22–32)

## 2023-12-19 LAB — POCT I-STAT 7, (LYTES, BLD GAS, ICA,H+H)
Acid-Base Excess: 1 mmol/L (ref 0.0–2.0)
Acid-Base Excess: 1 mmol/L (ref 0.0–2.0)
Acid-Base Excess: 1 mmol/L (ref 0.0–2.0)
Acid-Base Excess: 1 mmol/L (ref 0.0–2.0)
Acid-Base Excess: 1 mmol/L (ref 0.0–2.0)
Acid-base deficit: 1 mmol/L (ref 0.0–2.0)
Acid-base deficit: 2 mmol/L (ref 0.0–2.0)
Acid-base deficit: 2 mmol/L (ref 0.0–2.0)
Bicarbonate: 26.1 mmol/L (ref 20.0–28.0)
Bicarbonate: 27.1 mmol/L (ref 20.0–28.0)
Bicarbonate: 26.1 mmol/L (ref 20.0–28.0)
Bicarbonate: 25.5 mmol/L (ref 20.0–28.0)
Bicarbonate: 25.1 mmol/L (ref 20.0–28.0)
Bicarbonate: 24.3 mmol/L (ref 20.0–28.0)
Bicarbonate: 24.2 mmol/L (ref 20.0–28.0)
Bicarbonate: 24 mmol/L (ref 20.0–28.0)
Calcium, Ion: 1.25 mmol/L (ref 1.15–1.40)
Calcium, Ion: 1.04 mmol/L — ABNORMAL LOW (ref 1.15–1.40)
Calcium, Ion: 1.07 mmol/L — ABNORMAL LOW (ref 1.15–1.40)
Calcium, Ion: 1.09 mmol/L — ABNORMAL LOW (ref 1.15–1.40)
Calcium, Ion: 1.07 mmol/L — ABNORMAL LOW (ref 1.15–1.40)
Calcium, Ion: 1.16 mmol/L (ref 1.15–1.40)
Calcium, Ion: 1.17 mmol/L (ref 1.15–1.40)
Calcium, Ion: 1.15 mmol/L (ref 1.15–1.40)
HCT: 38 % — ABNORMAL LOW (ref 39.0–52.0)
HCT: 26 % — ABNORMAL LOW (ref 39.0–52.0)
HCT: 26 % — ABNORMAL LOW (ref 39.0–52.0)
HCT: 27 % — ABNORMAL LOW (ref 39.0–52.0)
HCT: 29 % — ABNORMAL LOW (ref 39.0–52.0)
HCT: 29 % — ABNORMAL LOW (ref 39.0–52.0)
HCT: 31 % — ABNORMAL LOW (ref 39.0–52.0)
HCT: 31 % — ABNORMAL LOW (ref 39.0–52.0)
Hemoglobin: 12.9 g/dL — ABNORMAL LOW (ref 13.0–17.0)
Hemoglobin: 8.8 g/dL — ABNORMAL LOW (ref 13.0–17.0)
Hemoglobin: 8.8 g/dL — ABNORMAL LOW (ref 13.0–17.0)
Hemoglobin: 9.2 g/dL — ABNORMAL LOW (ref 13.0–17.0)
Hemoglobin: 9.9 g/dL — ABNORMAL LOW (ref 13.0–17.0)
Hemoglobin: 9.9 g/dL — ABNORMAL LOW (ref 13.0–17.0)
Hemoglobin: 10.5 g/dL — ABNORMAL LOW (ref 13.0–17.0)
Hemoglobin: 10.5 g/dL — ABNORMAL LOW (ref 13.0–17.0)
O2 Saturation: 100 %
O2 Saturation: 100 %
O2 Saturation: 100 %
O2 Saturation: 100 %
O2 Saturation: 100 %
O2 Saturation: 93 %
O2 Saturation: 98 %
O2 Saturation: 98 %
Patient temperature: 35.7
Patient temperature: 37.6
Patient temperature: 37.6
Potassium: 3.5 mmol/L (ref 3.5–5.1)
Potassium: 3.7 mmol/L (ref 3.5–5.1)
Potassium: 4.2 mmol/L (ref 3.5–5.1)
Potassium: 4.2 mmol/L (ref 3.5–5.1)
Potassium: 3.7 mmol/L (ref 3.5–5.1)
Potassium: 3.5 mmol/L (ref 3.5–5.1)
Potassium: 4.3 mmol/L (ref 3.5–5.1)
Potassium: 4.3 mmol/L (ref 3.5–5.1)
Sodium: 141 mmol/L (ref 135–145)
Sodium: 141 mmol/L (ref 135–145)
Sodium: 140 mmol/L (ref 135–145)
Sodium: 140 mmol/L (ref 135–145)
Sodium: 140 mmol/L (ref 135–145)
Sodium: 143 mmol/L (ref 135–145)
Sodium: 141 mmol/L (ref 135–145)
Sodium: 140 mmol/L (ref 135–145)
TCO2: 27 mmol/L (ref 22–32)
TCO2: 29 mmol/L (ref 22–32)
TCO2: 27 mmol/L (ref 22–32)
TCO2: 27 mmol/L (ref 22–32)
TCO2: 26 mmol/L (ref 22–32)
TCO2: 26 mmol/L (ref 22–32)
TCO2: 26 mmol/L (ref 22–32)
TCO2: 25 mmol/L (ref 22–32)
pCO2 arterial: 43.9 mm[Hg] (ref 32–48)
pCO2 arterial: 48.2 mm[Hg] — ABNORMAL HIGH (ref 32–48)
pCO2 arterial: 42 mm[Hg] (ref 32–48)
pCO2 arterial: 40.3 mm[Hg] (ref 32–48)
pCO2 arterial: 37.8 mm[Hg] (ref 32–48)
pCO2 arterial: 38.2 mm[Hg] (ref 32–48)
pCO2 arterial: 45.6 mm[Hg] (ref 32–48)
pCO2 arterial: 44.8 mm[Hg] (ref 32–48)
pH, Arterial: 7.382 (ref 7.35–7.45)
pH, Arterial: 7.358 (ref 7.35–7.45)
pH, Arterial: 7.402 (ref 7.35–7.45)
pH, Arterial: 7.408 (ref 7.35–7.45)
pH, Arterial: 7.431 (ref 7.35–7.45)
pH, Arterial: 7.406 (ref 7.35–7.45)
pH, Arterial: 7.336 — ABNORMAL LOW (ref 7.35–7.45)
pH, Arterial: 7.34 — ABNORMAL LOW (ref 7.35–7.45)
pO2, Arterial: 505 mm[Hg] — ABNORMAL HIGH (ref 83–108)
pO2, Arterial: 381 mm[Hg] — ABNORMAL HIGH (ref 83–108)
pO2, Arterial: 234 mm[Hg] — ABNORMAL HIGH (ref 83–108)
pO2, Arterial: 308 mm[Hg] — ABNORMAL HIGH (ref 83–108)
pO2, Arterial: 361 mm[Hg] — ABNORMAL HIGH (ref 83–108)
pO2, Arterial: 64 mm[Hg] — ABNORMAL LOW (ref 83–108)
pO2, Arterial: 112 mm[Hg] — ABNORMAL HIGH (ref 83–108)
pO2, Arterial: 113 mm[Hg] — ABNORMAL HIGH (ref 83–108)

## 2023-12-19 LAB — HEPARIN LEVEL (UNFRACTIONATED): Heparin Unfractionated: 0.56 [IU]/mL (ref 0.30–0.70)

## 2023-12-19 LAB — CBC
HCT: 44 % (ref 39.0–52.0)
HCT: 31.7 % — ABNORMAL LOW (ref 39.0–52.0)
HCT: 30.7 % — ABNORMAL LOW (ref 39.0–52.0)
Hemoglobin: 15 g/dL (ref 13.0–17.0)
Hemoglobin: 11 g/dL — ABNORMAL LOW (ref 13.0–17.0)
Hemoglobin: 10.4 g/dL — ABNORMAL LOW (ref 13.0–17.0)
MCH: 31 pg (ref 26.0–34.0)
MCH: 31.7 pg (ref 26.0–34.0)
MCH: 31.3 pg (ref 26.0–34.0)
MCHC: 34.1 g/dL (ref 30.0–36.0)
MCHC: 34.7 g/dL (ref 30.0–36.0)
MCHC: 33.9 g/dL (ref 30.0–36.0)
MCV: 90.9 fL (ref 80.0–100.0)
MCV: 91.4 fL (ref 80.0–100.0)
MCV: 92.5 fL (ref 80.0–100.0)
Platelets: 169 10*3/uL (ref 150–400)
Platelets: 95 10*3/uL — ABNORMAL LOW (ref 150–400)
Platelets: 103 10*3/uL — ABNORMAL LOW (ref 150–400)
RBC: 4.84 MIL/uL (ref 4.22–5.81)
RBC: 3.47 MIL/uL — ABNORMAL LOW (ref 4.22–5.81)
RBC: 3.32 MIL/uL — ABNORMAL LOW (ref 4.22–5.81)
RDW: 12.5 % (ref 11.5–15.5)
RDW: 12.5 % (ref 11.5–15.5)
RDW: 12.5 % (ref 11.5–15.5)
WBC: 7.8 10*3/uL (ref 4.0–10.5)
WBC: 7.4 10*3/uL (ref 4.0–10.5)
WBC: 8.7 10*3/uL (ref 4.0–10.5)
nRBC: 0 % (ref 0.0–0.2)
nRBC: 0 % (ref 0.0–0.2)
nRBC: 0 % (ref 0.0–0.2)

## 2023-12-19 LAB — BASIC METABOLIC PANEL
Anion gap: 10 (ref 5–15)
Anion gap: 6 (ref 5–15)
BUN: 15 mg/dL (ref 8–23)
BUN: 11 mg/dL (ref 8–23)
CO2: 28 mmol/L (ref 22–32)
CO2: 24 mmol/L (ref 22–32)
Calcium: 9.7 mg/dL (ref 8.9–10.3)
Calcium: 7.8 mg/dL — ABNORMAL LOW (ref 8.9–10.3)
Chloride: 102 mmol/L (ref 98–111)
Chloride: 109 mmol/L (ref 98–111)
Creatinine, Ser: 0.96 mg/dL (ref 0.61–1.24)
Creatinine, Ser: 0.93 mg/dL (ref 0.61–1.24)
GFR, Estimated: >60 mL/min (ref >60)
GFR, Estimated: >60 mL/min (ref >60)
Glucose, Bld: 98 mg/dL (ref 70–99)
Glucose, Bld: 107 mg/dL — ABNORMAL HIGH (ref 70–99)
Potassium: 3.8 mmol/L (ref 3.5–5.1)
Potassium: 4.2 mmol/L (ref 3.5–5.1)
Sodium: 140 mmol/L (ref 135–145)
Sodium: 139 mmol/L (ref 135–145)

## 2023-12-19 LAB — HEMOGLOBIN AND HEMATOCRIT, BLOOD
HCT: 28.2 % — ABNORMAL LOW (ref 39.0–52.0)
Hemoglobin: 9.6 g/dL — ABNORMAL LOW (ref 13.0–17.0)

## 2023-12-19 LAB — POCT I-STAT EG7
Acid-Base Excess: 1 mmol/L (ref 0.0–2.0)
Bicarbonate: 26.2 mmol/L (ref 20.0–28.0)
Calcium, Ion: 1.07 mmol/L — ABNORMAL LOW (ref 1.15–1.40)
HCT: 27 % — ABNORMAL LOW (ref 39.0–52.0)
Hemoglobin: 9.2 g/dL — ABNORMAL LOW (ref 13.0–17.0)
O2 Saturation: 84 %
Potassium: 3.7 mmol/L (ref 3.5–5.1)
Sodium: 141 mmol/L (ref 135–145)
TCO2: 28 mmol/L (ref 22–32)
pCO2, Ven: 45.9 mm[Hg] (ref 44–60)
pH, Ven: 7.365 (ref 7.25–7.43)
pO2, Ven: 51 mm[Hg] — ABNORMAL HIGH (ref 32–45)

## 2023-12-19 LAB — GLUCOSE, CAPILLARY
Glucose-Capillary: 133 mg/dL — ABNORMAL HIGH (ref 70–99)
Glucose-Capillary: 134 mg/dL — ABNORMAL HIGH (ref 70–99)
Glucose-Capillary: 136 mg/dL — ABNORMAL HIGH (ref 70–99)
Glucose-Capillary: 113 mg/dL — ABNORMAL HIGH (ref 70–99)
Glucose-Capillary: 127 mg/dL — ABNORMAL HIGH (ref 70–99)
Glucose-Capillary: 115 mg/dL — ABNORMAL HIGH (ref 70–99)
Glucose-Capillary: 115 mg/dL — ABNORMAL HIGH (ref 70–99)

## 2023-12-19 LAB — ECHO INTRAOPERATIVE TEE
AR max vel: 2.53 cm2
AV Area VTI: 2.42 cm2
AV Area mean vel: 2.27 cm2
AV Peak grad: 10 mm[Hg]
Ao pk vel: 1.58 m/s
Area-P 1/2: 3.56 cm2
Height: 69 in
Weight: 3206.37 [oz_av]

## 2023-12-19 LAB — PROTIME-INR
INR: 1.6 — ABNORMAL HIGH (ref 0.8–1.2)
Prothrombin Time: 19.1 s — ABNORMAL HIGH (ref 11.4–15.2)

## 2023-12-19 LAB — MAGNESIUM: Magnesium: 2.9 mg/dL — ABNORMAL HIGH (ref 1.7–2.4)

## 2023-12-19 LAB — PLATELET COUNT: Platelets: 100 10*3/uL — ABNORMAL LOW (ref 150–400)

## 2023-12-19 LAB — APTT: aPTT: 28 s (ref 24–36)

## 2023-12-19 SURGERY — CORONARY ARTERY BYPASS GRAFTING (CABG)
Anesthesia: General | Site: Chest

## 2023-12-19 MED ORDER — ROCURONIUM BROMIDE 10 MG/ML (PF) SYRINGE
PREFILLED_SYRINGE | INTRAVENOUS | Status: AC
  Filled 2023-12-19: qty 20

## 2023-12-19 MED ORDER — SUGAMMADEX SODIUM 200 MG/2ML IV SOLN
INTRAVENOUS | Status: DC | PRN
  Administered 2023-12-19: 181.8 mg via INTRAVENOUS

## 2023-12-19 MED ORDER — SODIUM CHLORIDE 0.9 % IV SOLN: INTRAVENOUS | Status: DC | PRN

## 2023-12-19 MED ORDER — THROMBIN (RECOMBINANT) 20000 UNITS EX SOLR
CUTANEOUS | Status: AC
  Filled 2023-12-19: qty 40000

## 2023-12-19 MED ORDER — BISACODYL 5 MG PO TBEC
10.0000 mg | DELAYED_RELEASE_TABLET | Freq: Every day | ORAL | Status: DC
  Administered 2023-12-20: 10 mg via ORAL
  Filled 2023-12-19 (×2): qty 2

## 2023-12-19 MED ORDER — PROPOFOL 10 MG/ML IV BOLUS
INTRAVENOUS | Status: DC | PRN
  Administered 2023-12-19: 90 mg via INTRAVENOUS

## 2023-12-19 MED ORDER — SODIUM CHLORIDE 0.9% FLUSH
3.0000 mL | Freq: Two times a day (BID) | INTRAVENOUS | Status: DC
  Administered 2023-12-19: 10 mL via INTRAVENOUS
  Administered 2023-12-19: 3 mL via INTRAVENOUS
  Administered 2023-12-20 – 2023-12-21 (×2): 10 mL via INTRAVENOUS

## 2023-12-19 MED ORDER — ONDANSETRON HCL 4 MG/2ML IJ SOLN
INTRAMUSCULAR | Status: DC | PRN
  Administered 2023-12-19: 8 mg via INTRAVENOUS

## 2023-12-19 MED ORDER — EPHEDRINE SULFATE-NACL 50-0.9 MG/10ML-% IV SOSY
PREFILLED_SYRINGE | INTRAVENOUS | Status: DC | PRN
  Administered 2023-12-19: 2.5 mg via INTRAVENOUS

## 2023-12-19 MED ORDER — THROMBIN (RECOMBINANT) 20000 UNITS EX SOLR
CUTANEOUS | Status: AC
  Filled 2023-12-19: qty 20000

## 2023-12-19 MED ORDER — TRAMADOL HCL 50 MG PO TABS
50.0000 mg | ORAL_TABLET | ORAL | Status: DC | PRN
  Administered 2023-12-20: 50 mg via ORAL
  Filled 2023-12-19 (×3): qty 1

## 2023-12-19 MED ORDER — MORPHINE SULFATE (PF) 2 MG/ML IV SOLN
1.0000 mg | INTRAVENOUS | Status: DC | PRN
Start: 2023-12-19 — End: 2023-12-21
  Administered 2023-12-19: 1 mg via INTRAVENOUS
  Filled 2023-12-19 (×2): qty 1

## 2023-12-19 MED ORDER — PHENYLEPHRINE HCL-NACL 20-0.9 MG/250ML-% IV SOLN
0.0000 ug/min | INTRAVENOUS | Status: DC
  Administered 2023-12-19: 10 ug/min via INTRAVENOUS

## 2023-12-19 MED ORDER — ALBUMIN HUMAN 5 % IV SOLN: INTRAVENOUS | Status: DC | PRN

## 2023-12-19 MED ORDER — OXYCODONE HCL 5 MG PO TABS
5.0000 mg | ORAL_TABLET | ORAL | Status: DC | PRN
  Administered 2023-12-20: 5 mg via ORAL
  Filled 2023-12-19: qty 2
  Filled 2023-12-19: qty 1

## 2023-12-19 MED ORDER — PROTAMINE SULFATE 10 MG/ML IV SOLN
INTRAVENOUS | Status: DC | PRN
  Administered 2023-12-19: 310 mg via INTRAVENOUS

## 2023-12-19 MED ORDER — THROMBIN 20000 UNITS EX SOLR
OROMUCOSAL | Status: DC | PRN
  Administered 2023-12-19 (×3): 4 mL via TOPICAL

## 2023-12-19 MED ORDER — MIDAZOLAM HCL (PF) 5 MG/ML IJ SOLN
INTRAMUSCULAR | Status: DC | PRN
  Administered 2023-12-19 (×2): 1 mg via INTRAVENOUS

## 2023-12-19 MED ORDER — SODIUM CHLORIDE 0.9% FLUSH: 3.0000 mL | INTRAVENOUS | Status: DC | PRN

## 2023-12-19 MED ORDER — CHLORHEXIDINE GLUCONATE CLOTH 2 % EX PADS
6.0000 | MEDICATED_PAD | Freq: Every day | CUTANEOUS | Status: DC
  Administered 2023-12-19 – 2023-12-21 (×3): 6 via TOPICAL

## 2023-12-19 MED ORDER — ONDANSETRON HCL 4 MG/2ML IJ SOLN: INTRAMUSCULAR | Status: DC | PRN

## 2023-12-19 MED ORDER — MIDAZOLAM HCL 2 MG/2ML IJ SOLN
INTRAMUSCULAR | Status: AC
  Filled 2023-12-19: qty 2

## 2023-12-19 MED ORDER — DOCUSATE SODIUM 100 MG PO CAPS
200.0000 mg | ORAL_CAPSULE | Freq: Every day | ORAL | Status: DC
  Administered 2023-12-20: 200 mg via ORAL
  Filled 2023-12-19 (×2): qty 2

## 2023-12-19 MED ORDER — HEMOSTATIC AGENTS (NO CHARGE) OPTIME
TOPICAL | Status: DC | PRN
  Administered 2023-12-19 (×2): 1 via TOPICAL

## 2023-12-19 MED ORDER — MAGNESIUM SULFATE 4 GM/100ML IV SOLN
4.0000 g | Freq: Once | INTRAVENOUS | Status: AC
  Administered 2023-12-19: 4 g via INTRAVENOUS
  Filled 2023-12-19: qty 100

## 2023-12-19 MED ORDER — ONDANSETRON HCL 4 MG/2ML IJ SOLN
INTRAMUSCULAR | Status: AC
  Filled 2023-12-19: qty 2

## 2023-12-19 MED ORDER — FENTANYL CITRATE (PF) 250 MCG/5ML IJ SOLN
INTRAMUSCULAR | Status: DC | PRN
  Administered 2023-12-19 (×2): 50 ug via INTRAVENOUS
  Administered 2023-12-19: 150 ug via INTRAVENOUS
  Administered 2023-12-19 (×2): 100 ug via INTRAVENOUS

## 2023-12-19 MED ORDER — SODIUM CHLORIDE 0.9 % IV SOLN
20.0000 ug | Freq: Once | INTRAVENOUS | Status: AC
  Administered 2023-12-19: 20 ug via INTRAVENOUS
  Filled 2023-12-19: qty 5

## 2023-12-19 MED ORDER — MIDAZOLAM HCL 2 MG/2ML IJ SOLN
INTRAMUSCULAR | Status: AC
Start: 2023-12-19 — End: ?
  Filled 2023-12-19: qty 2

## 2023-12-19 MED ORDER — SODIUM CHLORIDE 0.9% FLUSH
3.0000 mL | Freq: Two times a day (BID) | INTRAVENOUS | Status: DC
  Administered 2023-12-20 – 2023-12-21 (×3): 3 mL via INTRAVENOUS

## 2023-12-19 MED ORDER — ASPIRIN 81 MG PO CHEW
324.0000 mg | CHEWABLE_TABLET | Freq: Once | ORAL | Status: AC
  Administered 2023-12-19: 324 mg via ORAL

## 2023-12-19 MED ORDER — METOPROLOL TARTRATE 5 MG/5ML IV SOLN
2.5000 mg | INTRAVENOUS | Status: DC | PRN
Start: 2023-12-19 — End: 2023-12-21

## 2023-12-19 MED ORDER — DEXMEDETOMIDINE HCL IN NACL 400 MCG/100ML IV SOLN: 0.0000 ug/kg/h | INTRAVENOUS | Status: DC

## 2023-12-19 MED ORDER — ASPIRIN 325 MG PO TBEC
325.0000 mg | DELAYED_RELEASE_TABLET | Freq: Every day | ORAL | Status: DC
  Administered 2023-12-20 – 2023-12-21 (×2): 325 mg via ORAL
  Filled 2023-12-19 (×2): qty 1

## 2023-12-19 MED ORDER — BISACODYL 10 MG RE SUPP: 10.0000 mg | Freq: Every day | RECTAL | Status: DC

## 2023-12-19 MED ORDER — DEXMEDETOMIDINE HCL IN NACL 400 MCG/100ML IV SOLN
INTRAVENOUS | Status: AC
  Filled 2023-12-19: qty 100

## 2023-12-19 MED ORDER — PANTOPRAZOLE SODIUM 40 MG IV SOLR
40.0000 mg | Freq: Every day | INTRAVENOUS | Status: AC
  Administered 2023-12-19 – 2023-12-20 (×2): 40 mg via INTRAVENOUS
  Filled 2023-12-19 (×2): qty 10

## 2023-12-19 MED ORDER — MIDAZOLAM HCL 2 MG/2ML IJ SOLN: 2.0000 mg | INTRAMUSCULAR | Status: DC | PRN

## 2023-12-19 MED ORDER — 0.9 % SODIUM CHLORIDE (POUR BTL) OPTIME
TOPICAL | Status: DC | PRN
  Administered 2023-12-19: 5000 mL

## 2023-12-19 MED ORDER — CALCIUM CHLORIDE 10 % IV SOLN
INTRAVENOUS | Status: AC
  Filled 2023-12-19: qty 10

## 2023-12-19 MED ORDER — HEPARIN SODIUM (PORCINE) 1000 UNIT/ML IJ SOLN
INTRAMUSCULAR | Status: DC | PRN
  Administered 2023-12-19: 31000 [IU] via INTRAVENOUS

## 2023-12-19 MED ORDER — ACETAMINOPHEN 500 MG PO TABS
1000.0000 mg | ORAL_TABLET | Freq: Four times a day (QID) | ORAL | Status: DC
  Administered 2023-12-19 – 2023-12-24 (×16): 1000 mg via ORAL
  Filled 2023-12-19 (×16): qty 2

## 2023-12-19 MED ORDER — THROMBIN 20000 UNITS EX SOLR: CUTANEOUS | Status: DC | PRN

## 2023-12-19 MED ORDER — PROPOFOL 10 MG/ML IV BOLUS
INTRAVENOUS | Status: AC
  Filled 2023-12-19: qty 20

## 2023-12-19 MED ORDER — METOCLOPRAMIDE HCL 5 MG/ML IJ SOLN
10.0000 mg | Freq: Four times a day (QID) | INTRAMUSCULAR | Status: AC
  Administered 2023-12-19 – 2023-12-20 (×4): 10 mg via INTRAVENOUS
  Filled 2023-12-19 (×4): qty 2

## 2023-12-19 MED ORDER — PLASMA-LYTE A IV SOLN
INTRAVENOUS | Status: DC | PRN
  Administered 2023-12-19: 500 mL

## 2023-12-19 MED ORDER — LACTATED RINGERS IV SOLN: INTRAVENOUS | Status: DC | PRN

## 2023-12-19 MED ORDER — CALCIUM CHLORIDE 10 % IV SOLN
INTRAVENOUS | Status: DC | PRN
  Administered 2023-12-19 (×2): 100 mg via INTRAVENOUS

## 2023-12-19 MED ORDER — NITROGLYCERIN 0.2 MG/ML ON CALL CATH LAB
INTRAVENOUS | Status: DC | PRN
  Administered 2023-12-19: 80 ug via INTRAVENOUS
  Administered 2023-12-19: 20 ug via INTRAVENOUS
  Administered 2023-12-19: 50 ug via INTRAVENOUS
  Administered 2023-12-19: 30 ug via INTRAVENOUS
  Administered 2023-12-19: 50 ug via INTRAVENOUS

## 2023-12-19 MED ORDER — ROCURONIUM BROMIDE 10 MG/ML (PF) SYRINGE
PREFILLED_SYRINGE | INTRAVENOUS | Status: DC | PRN
  Administered 2023-12-19: 70 mg via INTRAVENOUS
  Administered 2023-12-19: 30 mg via INTRAVENOUS
  Administered 2023-12-19: 100 mg via INTRAVENOUS
  Administered 2023-12-19: 50 mg via INTRAVENOUS

## 2023-12-19 MED ORDER — CHLORHEXIDINE GLUCONATE 0.12 % MT SOLN
15.0000 mL | OROMUCOSAL | Status: AC
  Administered 2023-12-19: 15 mL via OROMUCOSAL
  Filled 2023-12-19: qty 15

## 2023-12-19 MED ORDER — ACETAMINOPHEN 160 MG/5ML PO SOLN
650.0000 mg | Freq: Once | ORAL | Status: AC
  Administered 2023-12-19: 650 mg
  Filled 2023-12-19: qty 20.3

## 2023-12-19 MED ORDER — LACTATED RINGERS IV SOLN: INTRAVENOUS | Status: AC

## 2023-12-19 MED ORDER — SODIUM CHLORIDE 0.9 % IV SOLN: INTRAVENOUS | Status: AC

## 2023-12-19 MED ORDER — PROTAMINE SULFATE 10 MG/ML IV SOLN
INTRAVENOUS | Status: AC
  Filled 2023-12-19: qty 25

## 2023-12-19 MED ORDER — POTASSIUM CHLORIDE 10 MEQ/50ML IV SOLN
10.0000 meq | INTRAVENOUS | Status: AC
  Administered 2023-12-19 (×3): 10 meq via INTRAVENOUS

## 2023-12-19 MED ORDER — ALBUMIN HUMAN 5 % IV SOLN
250.0000 mL | INTRAVENOUS | Status: DC | PRN
  Administered 2023-12-19 (×3): 12.5 g via INTRAVENOUS
  Filled 2023-12-19: qty 250

## 2023-12-19 MED ORDER — METOPROLOL TARTRATE 12.5 MG HALF TABLET
12.5000 mg | ORAL_TABLET | Freq: Two times a day (BID) | ORAL | Status: DC
  Administered 2023-12-20 (×2): 12.5 mg via ORAL
  Filled 2023-12-19 (×2): qty 1

## 2023-12-19 MED ORDER — SODIUM CHLORIDE 0.45 % IV SOLN: INTRAVENOUS | Status: AC | PRN

## 2023-12-19 MED ORDER — ASPIRIN 81 MG PO CHEW
324.0000 mg | CHEWABLE_TABLET | Freq: Every day | ORAL | Status: DC
  Filled 2023-12-19 (×2): qty 4

## 2023-12-19 MED ORDER — INSULIN REGULAR(HUMAN) IN NACL 100-0.9 UT/100ML-% IV SOLN: INTRAVENOUS | Status: DC

## 2023-12-19 MED ORDER — VANCOMYCIN HCL IN DEXTROSE 1-5 GM/200ML-% IV SOLN
1000.0000 mg | Freq: Once | INTRAVENOUS | Status: AC
  Administered 2023-12-19: 1000 mg via INTRAVENOUS
  Filled 2023-12-19: qty 200

## 2023-12-19 MED ORDER — METOPROLOL TARTRATE 25 MG/10 ML ORAL SUSPENSION: 12.5000 mg | Freq: Two times a day (BID) | ORAL | Status: DC

## 2023-12-19 MED ORDER — ACETAMINOPHEN 160 MG/5ML PO SOLN: 1000.0000 mg | Freq: Four times a day (QID) | ORAL | Status: DC

## 2023-12-19 MED ORDER — PHENYLEPHRINE 80 MCG/ML (10ML) SYRINGE FOR IV PUSH (FOR BLOOD PRESSURE SUPPORT)
PREFILLED_SYRINGE | INTRAVENOUS | Status: DC | PRN
  Administered 2023-12-19 (×2): 40 ug via INTRAVENOUS

## 2023-12-19 MED ORDER — FENTANYL CITRATE (PF) 250 MCG/5ML IJ SOLN
INTRAMUSCULAR | Status: AC
  Filled 2023-12-19: qty 5

## 2023-12-19 MED ORDER — DEXTROSE 50 % IV SOLN: 0.0000 mL | INTRAVENOUS | Status: DC | PRN

## 2023-12-19 MED ORDER — ONDANSETRON HCL 4 MG/2ML IJ SOLN: 4.0000 mg | Freq: Four times a day (QID) | INTRAMUSCULAR | Status: DC | PRN

## 2023-12-19 MED ORDER — CEFAZOLIN SODIUM-DEXTROSE 2-4 GM/100ML-% IV SOLN
2.0000 g | Freq: Three times a day (TID) | INTRAVENOUS | Status: AC
  Administered 2023-12-19 – 2023-12-21 (×6): 2 g via INTRAVENOUS
  Filled 2023-12-19 (×6): qty 100

## 2023-12-19 MED ORDER — PANTOPRAZOLE SODIUM 40 MG PO TBEC
40.0000 mg | DELAYED_RELEASE_TABLET | Freq: Every day | ORAL | Status: DC
  Administered 2023-12-21 – 2023-12-24 (×4): 40 mg via ORAL
  Filled 2023-12-19 (×4): qty 1

## 2023-12-19 MED ORDER — ROCURONIUM BROMIDE 10 MG/ML (PF) SYRINGE
PREFILLED_SYRINGE | INTRAVENOUS | Status: AC
  Filled 2023-12-19: qty 10

## 2023-12-19 MED ORDER — SODIUM CHLORIDE 0.9% FLUSH
3.0000 mL | Freq: Two times a day (BID) | INTRAVENOUS | Status: DC
  Administered 2023-12-19: 3 mL via INTRAVENOUS
  Administered 2023-12-19 – 2023-12-21 (×3): 10 mL via INTRAVENOUS

## 2023-12-19 SURGICAL SUPPLY — 81 items
APPLICATOR TIP COSEAL (VASCULAR PRODUCTS) IMPLANT
BAG DECANTER FOR FLEXI CONT (MISCELLANEOUS) ×2 IMPLANT
BLADE CLIPPER SURG (BLADE) ×2 IMPLANT
BLADE STERNUM SYSTEM 6 (BLADE) ×2 IMPLANT
BNDG ELASTIC 4INX 5YD STR LF (GAUZE/BANDAGES/DRESSINGS) IMPLANT
BNDG ELASTIC 6INX 5YD STR LF (GAUZE/BANDAGES/DRESSINGS) IMPLANT
BNDG GAUZE DERMACEA FLUFF 4 (GAUZE/BANDAGES/DRESSINGS) ×2 IMPLANT
CANISTER SUCT 3000ML PPV (MISCELLANEOUS) ×2 IMPLANT
CANNULA ARTERIAL VENT 3/8 20FR (CANNULA) IMPLANT
CANNULA MC2 2 STG 36/46 CONN (CANNULA) IMPLANT
CANNULA VESSEL 3MM BLUNT TIP (CANNULA) IMPLANT
CATH ROBINSON RED A/P 18FR (CATHETERS) ×4 IMPLANT
CATH THORACIC 28FR (CATHETERS) ×2 IMPLANT
CATH THORACIC 36FR (CATHETERS) ×2 IMPLANT
CATH THORACIC 36FR RT ANG (CATHETERS) ×2 IMPLANT
CLIP FOGARTY SPRING 6M (CLIP) IMPLANT
CLIP RETRACTION 3.0MM CORONARY (MISCELLANEOUS) IMPLANT
CLIP TI WIDE RED SMALL 24 (CLIP) IMPLANT
CONTAINER PROTECT SURGISLUSH (MISCELLANEOUS) ×4 IMPLANT
DERMABOND ADVANCED .7 DNX12 (GAUZE/BANDAGES/DRESSINGS) IMPLANT
DRAPE SRG 135X102X78XABS (DRAPES) ×2 IMPLANT
DRAPE WARM FLUID 44X44 (DRAPES) ×2 IMPLANT
DRSG COVADERM 4X14 (GAUZE/BANDAGES/DRESSINGS) ×2 IMPLANT
ELECT BLADE 6.5 EXT (BLADE) IMPLANT
ELECT CAUTERY BLADE 6.4 (BLADE) ×2 IMPLANT
ELECT REM PT RETURN 9FT ADLT (ELECTROSURGICAL) ×2
ELECTRODE REM PT RTRN 9FT ADLT (ELECTROSURGICAL) ×4 IMPLANT
FELT TEFLON 1X6 (MISCELLANEOUS) ×2 IMPLANT
GAUZE SPONGE 4X4 12PLY STRL (GAUZE/BANDAGES/DRESSINGS) ×4 IMPLANT
GLOVE ECLIPSE 7.0 STRL STRAW (GLOVE) ×4 IMPLANT
GOWN STRL REUS W/ TWL LRG LVL3 (GOWN DISPOSABLE) ×8 IMPLANT
GOWN STRL REUS W/ TWL XL LVL3 (GOWN DISPOSABLE) ×2 IMPLANT
HEMOSTAT POWDER SURGIFOAM 1G (HEMOSTASIS) ×6 IMPLANT
HEMOSTAT SURGICEL 2X14 (HEMOSTASIS) ×2 IMPLANT
KIT BASIN OR (CUSTOM PROCEDURE TRAY) ×2 IMPLANT
KIT SUCTION CATH 14FR (SUCTIONS) ×2 IMPLANT
KIT TURNOVER KIT B (KITS) ×2 IMPLANT
KIT VASOVIEW ACCESSORY VH 2004 (KITS) IMPLANT
KIT VASOVIEW HEMOPRO 2 VH 4000 (KITS) ×2 IMPLANT
KNIFE MICRO-UNI 3.5 30 DEG (BLADE) ×2 IMPLANT
NS IRRIG 1000ML POUR BTL (IV SOLUTION) ×10 IMPLANT
PACK E OPEN HEART (SUTURE) ×2 IMPLANT
PACK OPEN HEART (CUSTOM PROCEDURE TRAY) ×2 IMPLANT
PAD ARMBOARD 7.5X6 YLW CONV (MISCELLANEOUS) ×4 IMPLANT
PAD ELECT DEFIB RADIOL ZOLL (MISCELLANEOUS) ×2 IMPLANT
PENCIL BUTTON HOLSTER BLD 10FT (ELECTRODE) ×2 IMPLANT
POSITIONER HEAD DONUT 9IN (MISCELLANEOUS) ×2 IMPLANT
PUNCH AORTIC ROTATE 4.5MM 8IN (MISCELLANEOUS) ×2 IMPLANT
SEALANT SURG COSEAL 8ML (VASCULAR PRODUCTS) IMPLANT
SET MPS 3-ND DEL (MISCELLANEOUS) IMPLANT
SPONGE T-LAP 18X18 ~~LOC~~+RFID (SPONGE) IMPLANT
STOPCOCK 4 WAY LG BORE MALE ST (IV SETS) IMPLANT
SUPPORT HEART JANKE-BARRON (MISCELLANEOUS) ×2 IMPLANT
SUT BONE WAX W31G (SUTURE) ×2 IMPLANT
SUT MNCRL AB 4-0 PS2 18 (SUTURE) IMPLANT
SUT PROLENE 3 0 SH1 36 (SUTURE) ×2 IMPLANT
SUT PROLENE 4-0 RB1 .5 CRCL 36 (SUTURE) IMPLANT
SUT PROLENE 5 0 C 1 36 (SUTURE) IMPLANT
SUT PROLENE 6 0 C 1 30 (SUTURE) IMPLANT
SUT PROLENE 7 0 BV1 MDA (SUTURE) ×2 IMPLANT
SUT PROLENE 8 0 BV175 6 (SUTURE) IMPLANT
SUT SILK 1 MH (SUTURE) IMPLANT
SUT SILK 2 0 SH (SUTURE) IMPLANT
SUT SILK 2 0 SH CR/8 (SUTURE) IMPLANT
SUT STEEL 6MS V (SUTURE) IMPLANT
SUT STEEL STERNAL CCS#1 18IN (SUTURE) IMPLANT
SUT STEEL SZ 6 DBL 3X14 BALL (SUTURE) IMPLANT
SUT VIC AB 1 CTX36XBRD ANBCTR (SUTURE) ×4 IMPLANT
SUT VIC AB 2-0 CT1 TAPERPNT 27 (SUTURE) IMPLANT
SUT VIC AB 2-0 CTX 27 (SUTURE) IMPLANT
SUT VIC AB 3-0 SH 27X BRD (SUTURE) IMPLANT
SUT VIC AB 3-0 X1 27 (SUTURE) IMPLANT
SYSTEM SAHARA CHEST DRAIN ATS (WOUND CARE) ×2 IMPLANT
TAPE CLOTH SURG 4X10 WHT LF (GAUZE/BANDAGES/DRESSINGS) IMPLANT
TAPE PAPER 2X10 WHT MICROPORE (GAUZE/BANDAGES/DRESSINGS) IMPLANT
TOWEL GREEN STERILE (TOWEL DISPOSABLE) ×2 IMPLANT
TOWEL GREEN STERILE FF (TOWEL DISPOSABLE) ×2 IMPLANT
TRAY FOLEY SLVR 16FR TEMP STAT (SET/KITS/TRAYS/PACK) ×2 IMPLANT
TUBING LAP HI FLOW INSUFFLATIO (TUBING) ×2 IMPLANT
UNDERPAD 30X36 HEAVY ABSORB (UNDERPADS AND DIAPERS) ×2 IMPLANT
WATER STERILE IRR 1000ML POUR (IV SOLUTION) ×4 IMPLANT

## 2023-12-19 NOTE — Anesthesia Procedure Notes (Signed)
 Procedure Name: Intubation Date/Time: 12/19/2023 7:36 AM  Performed by: Zelphia Norleen HERO, CRNAPre-anesthesia Checklist: Patient identified, Emergency Drugs available, Suction available and Patient being monitored Patient Re-evaluated:Patient Re-evaluated prior to induction Oxygen Delivery Method: Circle system utilized Preoxygenation: Pre-oxygenation with 100% oxygen Induction Type: IV induction Ventilation: Mask ventilation without difficulty Laryngoscope Size: Mac and 3 Grade View: Grade II Tube type: Oral Tube size: 8.0 mm Number of attempts: 1 Airway Equipment and Method: Stylet and Oral airway Placement Confirmation: ETT inserted through vocal cords under direct vision, positive ETCO2 and breath sounds checked- equal and bilateral Secured at: 23 cm Tube secured with: Tape Dental Injury: Teeth and Oropharynx as per pre-operative assessment

## 2023-12-19 NOTE — Anesthesia Postprocedure Evaluation (Signed)
 Anesthesia Post Note  Patient: Terry Vega  Procedure(s) Performed: CORONARY ARTERY BYPASS GRAFTING (CABG) TIMES FIVE USING LEFT INTERNAL MAMMARY ARTERY AND BILATERAL ENDOSCOPICALLY HARVESTED GREATER SAPHENOUS VEIN (Chest) TRANSESOPHAGEAL ECHOCARDIOGRAM (TEE) (Chest)     Patient location during evaluation: SICU Anesthesia Type: General Level of consciousness: sedated Pain management: pain level controlled Vital Signs Assessment: post-procedure vital signs reviewed and stable Respiratory status: patient remains intubated per anesthesia plan Cardiovascular status: stable Postop Assessment: no apparent nausea or vomiting Anesthetic complications: no   No notable events documented.  Last Vitals:  Vitals:   12/19/23 1648 12/19/23 1700  BP:  105/61  Pulse: 67 66  Resp: 17 16  Temp: 37.3 C 37.5 C  SpO2: 100% 98%    Last Pain:  Vitals:   12/19/23 0639  TempSrc:   PainSc: 0-No pain                 Cordella P Simranjit Thayer

## 2023-12-19 NOTE — Brief Op Note (Signed)
 12/16/2023 - 12/19/2023  12:38 PM  PATIENT:  Terry Vega  78 y.o. male  PRE-OPERATIVE DIAGNOSIS:  Coronary Artery Disease  POST-OPERATIVE DIAGNOSIS:  Coronary Artery Disease  PROCEDURE:  CORONARY ARTERY BYPASS GRAFTING (CABG) TIMES FIVE USING LEFT INTERNAL MAMMARY ARTERY AND BILATERAL GREATER SAPHENOUS VEIN   LIMA-LAD SVG-D1 SVG-D1 SVG-OM SVG-Distal RCA  ENDOSCOPICALLY HARVESTED GREATER SAPHENOUS VEIN  TRANSESOPHAGEAL ECHOCARDIOGRAM  Vein harvest time: Vein prep time:  SURGEON:  Lucas Dorise POUR, MD   PHYSICIAN ASSISTANT: Delaney Perona  ASSISTANTS: Dyann Iha, RN, Scrub Person         Price, Evalene BIRCH, RN, RN First Assistant   ANESTHESIA:   general  EBL:   BLOOD ADMINISTERED:none  DRAINS: mediastinal drains  LOCAL MEDICATIONS USED:  NONE  COUNTS: Correct  DICTATION: .Dragon Dictation  PLAN OF CARE: Admit to inpatient   PATIENT DISPOSITION:  ICU - intubated and hemodynamically stable.   Delay start of Pharmacological VTE agent (>24hrs) due to surgical blood loss or risk of bleeding: yes

## 2023-12-19 NOTE — Interval H&P Note (Signed)
 History and Physical Interval Note:  12/19/2023 6:17 AM  Terry Vega  has presented today for surgery, with the diagnosis of CAD.  The various methods of treatment have been discussed with the patient and family. After consideration of risks, benefits and other options for treatment, the patient has consented to  Procedure(s): CORONARY ARTERY BYPASS GRAFTING (CABG) (N/A) TRANSESOPHAGEAL ECHOCARDIOGRAM (TEE) (N/A) as a surgical intervention.  The patient's history has been reviewed, patient examined, no change in status, stable for surgery.  I have reviewed the patient's chart and labs.  Questions were answered to the patient's satisfaction.     Nakenya Theall K Frankye Schwegel

## 2023-12-19 NOTE — Hospital Course (Addendum)
 Referring: Darron Deatrice LABOR, MD Primary Care: Levora Reyes SAUNDERS, MD Primary Cardiologist:Muhammad Darron, MD  History of Present Illness:   We are asked to see this 78 year old male with history of hypertension, PVCs, diastolic dysfunction and hypothyroidism in cardiothoracic surgical consultation for consideration of coronary artery surgical revascularization.  He was previously diagnosed with PVCs in 2014.  He underwent cardiology evaluation including echocardiogram which showed at that time with normal left ventricular ejection fraction.  Stress testing showed no evidence of ischemia.  He was initially treated with diltiazem  but later developed lower extremity swelling and had been changed to Coreg .  Follow-up echocardiogram in September 2022 showed EF 60 to 65%, mild LVH, grade 1 diastolic dysfunction, mild dilated LA, mild MR/AI, and mild aortic valve sclerosis.  He was recently seen in the cardiology office with complaints of exertional chest pressure and dyspnea.  He was subsequently set up for left heart catheterization which was done on 12/15/2023 and showed moderately calcified arteries with severe three-vessel CAD.  Due to the severity of these findings he was felt to require transfer to [facility] for CT surgical evaluation/possible CABG.  Cardiac risk factors also include hyperlipidemia.  He has no previous history of tobacco abuse.  The echocardiogram showed preserved left ventricular function and no significant valvular disease.  Mr. Bolger was evaluated by Dr. Lucas and coronary bypass grafting was offered to the patient.  Mr. Hu decided to proceed with surgery.  Hospital Course: Mr. Taddei was taken to the operative room on 12/19/2023 with 5 vessel coronary bypass grafting was carried out with a left internal mammary artery graft into the left anterior descending coronary artery.  Separate saphenous vein grafts were placed to the first and second diagonal, obtuse marginal, and distal right  coronary arteries.  Following the procedure, he separated from cardiopulmonary bypass without any difficulty.  He was transferred to the surgical ICU in stable condition.  Vital signs and hemodynamics were stable. He was extubated by 5pm on the day of surgery.  The monitoring lines and pacer wires were removed routinely on post-op day 1 and he was mobilized and quickly progressed to independent ambulation. By post-op day 2, he was ready for transfer to [facility]. He was diuresed with appropriate response.  Diet and activity were advanced and well-tolerated.  He had return of bowel function.   All wounds are clean, dry, healing without signs of infection. Per Dr. Lucas, at discharge, stop Lopressor  and restart Coreg  at discharge. His BP will not allow for restart Hyzaar so will do when able as an outpatient. He is stable for discharge today.

## 2023-12-19 NOTE — Procedures (Signed)
 Extubation Procedure Note  Patient Details:   Name: [name] DOB: 05/07/1946 MRN: [id number]   Airway Documentation:    Vent end date: 12/19/23 Vent end time: 1723   Evaluation  O2 sats: stable throughout Complications: No apparent complications Patient did tolerate procedure well. Bilateral Breath Sounds: Clear   Yes Pt extubated to 3L Mark. FVC 1.1L NIF: -40 cmH2O Positive cuff leak noted [name] V 12/19/2023, 5:24 PM

## 2023-12-19 NOTE — Anesthesia Procedure Notes (Signed)
 Arterial Line Insertion Start/End2/02/2024 7:00 AM, 12/19/2023 7:05 AM Performed by: Zelphia Norleen HERO, CRNA, CRNA  Patient location: Pre-op. Preanesthetic checklist: patient identified, IV checked, site marked, risks and benefits discussed, surgical consent, monitors and equipment checked, pre-op evaluation, timeout performed and anesthesia consent Lidocaine  1% used for infiltration Left, radial was placed Catheter size: 20 G Hand hygiene performed  and maximum sterile barriers used   Attempts: 1 Procedure performed without using ultrasound guided technique. Following insertion, dressing applied and Biopatch. Post procedure assessment: normal and unchanged  Patient tolerated the procedure well with no immediate complications.

## 2023-12-19 NOTE — Progress Notes (Signed)
      [address]       [place] [address]             [phone]      S/p CABG x 5  Being extubated now  BP 105/61   Pulse 66   Temp 99.5 F (37.5 C)   Resp 16   Ht 5' 9 (1.753 m)   Wt 90.9 kg   SpO2 98%   BMI 29.59 kg/m  34/16 CI 2.03   Intake/Output Summary (Last 24 hours) at 12/19/2023 1722 Last data filed at 12/19/2023 1700 Gross per 24 hour  Intake 5269.91 ml  Output 3858 ml  Net 1411.91 ml   Minimal CT output  Doing well early postop  Elspeth C. Kerrin, MD [facility] [phone]

## 2023-12-19 NOTE — Transfer of Care (Signed)
 Immediate Anesthesia Transfer of Care Note  Patient: Terry Vega  Procedure(s) Performed: CORONARY ARTERY BYPASS GRAFTING (CABG) TIMES FIVE USING LEFT INTERNAL MAMMARY ARTERY AND BILATERAL ENDOSCOPICALLY HARVESTED GREATER SAPHENOUS VEIN (Chest) TRANSESOPHAGEAL ECHOCARDIOGRAM (TEE) (Chest)  Patient Location: SICU  Anesthesia Type:General  Level of Consciousness: Patient remains intubated per anesthesia plan  Airway & Oxygen Therapy: Patient placed on Ventilator (see vital sign flow sheet for setting)  Post-op Assessment: Report given to RN and Post -op Vital signs reviewed and stable  Post vital signs: Reviewed and stable  Last Vitals:  Vitals Value Taken Time  BP 91/51 12/19/23 1430  Temp 35.7 C 12/19/23 1433  Pulse 80 12/19/23 1433  Resp 14 12/19/23 1433  SpO2 94 % 12/19/23 1433  Vitals shown include unfiled device data.  Last Pain:  Vitals:   12/19/23 0639  TempSrc:   PainSc: 0-No pain         Complications: No notable events documented.

## 2023-12-19 NOTE — Anesthesia Procedure Notes (Signed)
 Central Venous Catheter Insertion Performed by: Dorethea Cordella SQUIBB, DO, anesthesiologist Start/End2/02/2024 6:53 AM, 12/19/2023 7:00 AM Patient location: Pre-op. Preanesthetic checklist: patient identified, IV checked, site marked, risks and benefits discussed, surgical consent, monitors and equipment checked, pre-op evaluation, timeout performed and anesthesia consent Position: Trendelenburg Lidocaine  1% used for infiltration and patient sedated Hand hygiene performed  and maximum sterile barriers used  Catheter size: 8.5 Fr Sheath introducer Procedure performed using ultrasound guided technique. Ultrasound Notes:anatomy identified, needle tip was noted to be adjacent to the nerve/plexus identified, no ultrasound evidence of intravascular and/or intraneural injection and image(s) printed for medical record Attempts: 1 Following insertion, line sutured, dressing applied and Biopatch. Post procedure assessment: blood return through all ports, free fluid flow and no air  Patient tolerated the procedure well with no immediate complications.

## 2023-12-19 NOTE — Anesthesia Procedure Notes (Signed)
 Central Venous Catheter Insertion Performed by: Dorethea Cordella SQUIBB, DO, anesthesiologist Start/End2/02/2024 7:01 AM, 12/19/2023 7:03 AM Patient location: Pre-op. Preanesthetic checklist: patient identified, IV checked, site marked, risks and benefits discussed, surgical consent, monitors and equipment checked, pre-op evaluation, timeout performed and anesthesia consent Position: supine Hand hygiene performed  and maximum sterile barriers used  PA cath was placed.Swan type:thermodilution PA Cath depth:45 Procedure performed without using ultrasound guided technique. Attempts: 1 Patient tolerated the procedure well with no immediate complications.

## 2023-12-20 ENCOUNTER — Inpatient Hospital Stay (HOSPITAL_COMMUNITY): Payer: Medicare HMO

## 2023-12-20 ENCOUNTER — Encounter (HOSPITAL_COMMUNITY): Payer: Self-pay | Admitting: Surgery

## 2023-12-20 DIAGNOSIS — I200 Unstable angina: Secondary | ICD-10-CM | POA: Diagnosis not present

## 2023-12-20 LAB — BASIC METABOLIC PANEL
Anion gap: 8 (ref 5–15)
Anion gap: 8 (ref 5–15)
BUN: 10 mg/dL (ref 8–23)
BUN: 12 mg/dL (ref 8–23)
CO2: 23 mmol/L (ref 22–32)
CO2: 24 mmol/L (ref 22–32)
Calcium: 7.9 mg/dL — ABNORMAL LOW (ref 8.9–10.3)
Calcium: 8.2 mg/dL — ABNORMAL LOW (ref 8.9–10.3)
Chloride: 108 mmol/L (ref 98–111)
Chloride: 103 mmol/L (ref 98–111)
Creatinine, Ser: 0.74 mg/dL (ref 0.61–1.24)
Creatinine, Ser: 0.79 mg/dL (ref 0.61–1.24)
GFR, Estimated: >60 mL/min (ref >60)
GFR, Estimated: >60 mL/min (ref >60)
Glucose, Bld: 91 mg/dL (ref 70–99)
Glucose, Bld: 105 mg/dL — ABNORMAL HIGH (ref 70–99)
Potassium: 4.1 mmol/L (ref 3.5–5.1)
Potassium: 3.8 mmol/L (ref 3.5–5.1)
Sodium: 139 mmol/L (ref 135–145)
Sodium: 135 mmol/L (ref 135–145)

## 2023-12-20 LAB — CBC
HCT: 31.9 % — ABNORMAL LOW (ref 39.0–52.0)
HCT: 34.7 % — ABNORMAL LOW (ref 39.0–52.0)
Hemoglobin: 10.7 g/dL — ABNORMAL LOW (ref 13.0–17.0)
Hemoglobin: 11.6 g/dL — ABNORMAL LOW (ref 13.0–17.0)
MCH: 31.3 pg (ref 26.0–34.0)
MCH: 31.7 pg (ref 26.0–34.0)
MCHC: 33.5 g/dL (ref 30.0–36.0)
MCHC: 33.4 g/dL (ref 30.0–36.0)
MCV: 93.3 fL (ref 80.0–100.0)
MCV: 94.8 fL (ref 80.0–100.0)
Platelets: 107 10*3/uL — ABNORMAL LOW (ref 150–400)
Platelets: 115 10*3/uL — ABNORMAL LOW (ref 150–400)
RBC: 3.42 MIL/uL — ABNORMAL LOW (ref 4.22–5.81)
RBC: 3.66 MIL/uL — ABNORMAL LOW (ref 4.22–5.81)
RDW: 12.9 % (ref 11.5–15.5)
RDW: 13.1 % (ref 11.5–15.5)
WBC: 8 10*3/uL (ref 4.0–10.5)
WBC: 9.2 10*3/uL (ref 4.0–10.5)
nRBC: 0 % (ref 0.0–0.2)
nRBC: 0 % (ref 0.0–0.2)

## 2023-12-20 LAB — GLUCOSE, CAPILLARY
Glucose-Capillary: 115 mg/dL — ABNORMAL HIGH (ref 70–99)
Glucose-Capillary: 115 mg/dL — ABNORMAL HIGH (ref 70–99)
Glucose-Capillary: 120 mg/dL — ABNORMAL HIGH (ref 70–99)
Glucose-Capillary: 123 mg/dL — ABNORMAL HIGH (ref 70–99)
Glucose-Capillary: 142 mg/dL — ABNORMAL HIGH (ref 70–99)
Glucose-Capillary: 87 mg/dL (ref 70–99)
Glucose-Capillary: 88 mg/dL (ref 70–99)
Glucose-Capillary: 90 mg/dL (ref 70–99)

## 2023-12-20 LAB — MAGNESIUM
Magnesium: 2.6 mg/dL — ABNORMAL HIGH (ref 1.7–2.4)
Magnesium: 2.5 mg/dL — ABNORMAL HIGH (ref 1.7–2.4)

## 2023-12-20 MED ORDER — INSULIN ASPART 100 UNIT/ML IJ SOLN: 0.0000 [IU] | INTRAMUSCULAR | Status: DC

## 2023-12-20 MED ORDER — INSULIN ASPART 100 UNIT/ML IJ SOLN
0.0000 [IU] | Freq: Three times a day (TID) | INTRAMUSCULAR | Status: DC
  Administered 2023-12-20: 2 [IU] via SUBCUTANEOUS

## 2023-12-20 MED ORDER — FUROSEMIDE 10 MG/ML IJ SOLN
40.0000 mg | Freq: Once | INTRAMUSCULAR | Status: AC
  Administered 2023-12-20: 40 mg via INTRAVENOUS
  Filled 2023-12-20: qty 4

## 2023-12-20 MED ORDER — LEVOTHYROXINE SODIUM 100 MCG PO TABS
100.0000 ug | ORAL_TABLET | Freq: Every day | ORAL | Status: DC
  Administered 2023-12-21 – 2023-12-24 (×4): 100 ug via ORAL
  Filled 2023-12-20 (×4): qty 1

## 2023-12-20 MED FILL — Thrombin (Recombinant) For Soln 20000 Unit: CUTANEOUS | Qty: 1 | Status: AC

## 2023-12-20 NOTE — Progress Notes (Signed)
   Patient Name: Terry Vega Date of Encounter: 12/20/2023 [facility] Cardiologist: Deatrice Cage, MD   Interval Summary  .    Feels well  Vital Signs .    Vitals:   12/20/23 0800 12/20/23 0815 12/20/23 0830 12/20/23 0845  BP: 123/86   120/62  Pulse: 76 72 71 71  Resp: 18 20 19 17   Temp: 99.1 F (37.3 C) 99.3 F (37.4 C) 99.3 F (37.4 C) 99.3 F (37.4 C)  TempSrc:      SpO2: 98% 97% 98% 97%  Weight:      Height:        Intake/Output Summary (Last 24 hours) at 12/20/2023 0924 Last data filed at 12/20/2023 0800 Gross per 24 hour  Intake 5697.85 ml  Output 3667 ml  Net 2030.85 ml      12/20/2023    5:00 AM 12/19/2023    3:48 AM 12/18/2023    4:14 AM  Last 3 Weights  Weight (lbs) 213 lb 3 oz 200 lb 6.4 oz 201 lb 12.8 oz  Weight (kg) 96.7 kg 90.9 kg 91.536 kg      Telemetry/ECG    Personally Reviewed EKG 12/20/2023: Sinus rhythm 73 bpm Normal EKG  Physical Exam .   Physical Exam Vitals and nursing note reviewed.  Constitutional:      General: He is not in acute distress. Neck:     Vascular: No JVD.  Cardiovascular:     Rate and Rhythm: Normal rate and regular rhythm.     Heart sounds: Normal heart sounds. No murmur heard.    Comments: Pacing wire, Swan, chest tube in place Pulmonary:     Effort: Pulmonary effort is normal.     Breath sounds: Normal breath sounds. No wheezing or rales.      Assessment & Plan .     78 y/o male w/hypertension, unstable agnina   Unstable angina: Now s/p CABGX5 (LIMA-LAD, SVG-D1, SVG -?, SVG-OM, SVG-dRCA) POD#1. Doing well. Extubated. No pressors. Continue Aspirin , Lipitor 40 mg daily, metoprolol  12.5 mg bid. Diuresing well on IV lasix  40 mg.   For questions or updates, please contact [facility] Please consult [email or web address] for contact info under        Signed, Newman JINNY Lawrence, MD

## 2023-12-20 NOTE — Op Note (Signed)
 CARDIOVASCULAR SURGERY OPERATIVE NOTE  12/19/2023  Surgeon:  Dorise LOIS Fellers, MD  First Assistant: Laurel Becket,  PA-C:   An experienced assistant was required given the complexity of this surgery and the standard of surgical care. The assistant was needed for endoscopic vein harvest, exposure, dissection, suctioning, retraction of delicate tissues and sutures, instrument exchange and for overall help during this procedure.   Preoperative Diagnosis:  Severe multi-vessel coronary artery disease   Postoperative Diagnosis:  Same   Procedure:  Median Sternotomy Extracorporeal circulation 3.   Coronary artery bypass grafting x 5  Left internal mammary artery graft to the LAD SVG to diagonal 1 SVG to diagonal 2 SVG to OM SVG to distal RCA 4.   Endoscopic vein harvest from the right and left legs   Anesthesia:  General Endotracheal   Clinical History/Surgical Indication:  This 78 year old gentleman present with a 3 wk history of exertional chest discomfort radiating into the left shoulder and arm, relieved with rest. Cath showed severe 3V coronary artery disease with moderately calcified vessels and high grade bifurcation stenoses in the LAD and LCX. LVEF normal with no significant valvular disease. I agree that CABG is the best treatment for him. I discussed the operative procedure with the patient and his wife and two daughters including alternatives, benefits and risks; including but not limited to bleeding, blood transfusion, infection, stroke, myocardial infarction, graft failure, heart block requiring a permanent pacemaker, organ dysfunction, and death.  Nelwyn LITTIE Finder understands and agrees to proceed.   Preparation:  The patient was seen in the preoperative holding area and the correct patient, correct operation were confirmed with the patient after reviewing the medical record and  catheterization. The consent was signed by me. Preoperative antibiotics were given. A pulmonary arterial line and radial arterial line were placed by the anesthesia team. The patient was taken back to the operating room and positioned supine on the operating room table. After being placed under general endotracheal anesthesia by the anesthesia team a foley catheter was placed. The neck, chest, abdomen, and both legs were prepped with betadine soap and solution and draped in the usual sterile manner. A surgical time-out was taken and the correct patient and operative procedure were confirmed with the nursing and anesthesia staff.   Cardiopulmonary Bypass:  A median sternotomy was performed. The pericardium was opened in the midline. Right ventricular function appeared normal. The ascending aorta was of normal size and had no palpable plaque. There were no contraindications to aortic cannulation or cross-clamping. The patient was fully systemically heparinized and the ACT was maintained > 400 sec. The proximal aortic arch was cannulated with a 20 F aortic cannula for arterial inflow. Venous cannulation was performed via the right atrial appendage using a two-staged venous cannula. An antegrade cardioplegia/vent cannula was inserted into the mid-ascending aorta. Aortic occlusion was performed with a single cross-clamp. Systemic cooling to 32 degrees Centigrade and topical cooling of the heart with iced saline were used. Hyperkalemic antegrade cold blood cardioplegia was used to induce diastolic arrest and was then given at about 20 minute intervals throughout the period of arrest to maintain myocardial temperature at or below 10 degrees centigrade. A temperature probe was inserted into the interventricular septum and an insulating pad was placed in the pericardium.   Left internal mammary artery harvest:  The left side of the sternum was retracted using the Rultract retractor. The left internal mammary artery  was harvested as a pedicle graft. All side branches  were clipped. It was a large-sized vessel of good quality with excellent blood flow. It was ligated distally and divided. It was sprayed with topical papaverine  solution to prevent vasospasm.   Endoscopic vein harvest: Performed by Laurel Becket, PA-C  The right greater saphenous vein was harvested endoscopically through a 2 cm incision medial to the right knee. It was harvested from the upper thigh to below the knee.  Part of this vein was small to medium caliber and of good quality.  There are also parts that were small and not ideal.  Therefore a section of saphenous vein was harvested from the left thigh endoscopically and this vein was a medium size vessel of good quality. The side branches were all ligated with 4-0 silk ties.    Coronary arteries:  The coronary arteries were examined.  LAD:  diffusely disease extending out to the apex.There was a distal lesion in the LAD seen on catheterization but the LAD beyond that was small and too diseased open.  The 2 diagonal branches were both intramyocardial and were medium caliber vessels. LCX: The obtuse marginal was a large vessel with no distal disease.  The medial subbranch that came off at its bifurcation was small. RCA: The RCA was diffusely diseased and this extended out into the distal vessel. The distal branches themselves were relatively small.    Grafts:  LIMA to the LAD: 1.6 mm distally. It was sewn end to side using 8-0 prolene continuous suture. SVG to D1:  1.6 mm. It was sewn end to side using 7-0 prolene continuous suture. SVG to D2:  1.6 mm. It was sewn end to side using 7-0 prolene continuous suture. SVG to OM:  1.75 mm. It was sewn end to side using 7-0 prolene continuous suture. SVG to distal RCA:  1.75 mm. It was sewn end to side using 7-0 prolene continuous suture.  The proximal vein graft anastomoses were performed to the mid-ascending aorta using continuous 6-0  prolene suture. Graft markers were placed around the proximal anastomoses.   Completion:  The patient was rewarmed to 37 degrees Centigrade. The clamp was removed from the LIMA pedicle and there was rapid warming of the septum and return of ventricular fibrillation. The crossclamp was removed with a time of 124 minutes. There was spontaneous return of sinus rhythm. The distal and proximal anastomoses were checked for hemostasis. The position of the grafts was satisfactory. Two temporary epicardial pacing wires were placed on the right atrium and two on the right ventricle. The patient was weaned from CPB without difficulty on no inotropes. CPB time was 143 minutes. Cardiac output was 4.5 LPM. TEE showed normal LV systolic function. Heparin  was fully reversed with protamine  and the aortic and venous cannulas removed. Hemostasis was achieved. Mediastinal and left pleural drainage tubes were placed. The sternum was closed with  #6 stainless steel wires. The fascia was closed with continuous # 1 vicryl suture. The subcutaneous tissue was closed with 2-0 vicryl continuous suture. The skin was closed with 3-0 vicryl subcuticular suture. All sponge, needle, and instrument counts were reported correct at the end of the case. Dry sterile dressings were placed over the incisions and around the chest tubes which were connected to pleurevac suction. The patient was then transported to the surgical intensive care unit in stable condition.

## 2023-12-20 NOTE — Progress Notes (Signed)
 Epicardial pacing wires removed per MD order. Wires intact upon removal. No resistance met or complications during wire removal. Patient on bedrest for one hour beginning 0805. VSS. Care ongoing.

## 2023-12-20 NOTE — Plan of Care (Signed)
 Problem: Education: Goal: Knowledge of General Education information will improve Description: Including pain rating scale, medication(s)/side effects and non-pharmacologic comfort measures Outcome: Progressing   Problem: Health Behavior/Discharge Planning: Goal: Ability to manage health-related needs will improve Outcome: Progressing   Problem: Clinical Measurements: Goal: Ability to maintain clinical measurements within normal limits will improve Outcome: Progressing Goal: Will remain free from infection Outcome: Progressing Goal: Diagnostic test results will improve Outcome: Progressing Goal: Respiratory complications will improve Outcome: Progressing Goal: Cardiovascular complication will be avoided Outcome: Progressing   Problem: Activity: Goal: Risk for activity intolerance will decrease Outcome: Progressing   Problem: Nutrition: Goal: Adequate nutrition will be maintained Outcome: Progressing   Problem: Coping: Goal: Level of anxiety will decrease Outcome: Progressing   Problem: Elimination: Goal: Will not experience complications related to bowel motility Outcome: Progressing Goal: Will not experience complications related to urinary retention Outcome: Progressing   Problem: Pain Managment: Goal: General experience of comfort will improve and/or be controlled Outcome: Progressing   Problem: Safety: Goal: Ability to remain free from injury will improve Outcome: Progressing   Problem: Skin Integrity: Goal: Risk for impaired skin integrity will decrease Outcome: Progressing   Problem: Education: Goal: Understanding of cardiac disease, CV risk reduction, and recovery process will improve Outcome: Progressing Goal: Individualized Educational Video(s) Outcome: Progressing   Problem: Activity: Goal: Ability to tolerate increased activity will improve Outcome: Progressing   Problem: Cardiac: Goal: Ability to achieve and maintain adequate cardiovascular  perfusion will improve Outcome: Progressing   Problem: Health Behavior/Discharge Planning: Goal: Ability to safely manage health-related needs after discharge will improve Outcome: Progressing   Problem: Education: Goal: Knowledge of General Education information will improve Description: Including pain rating scale, medication(s)/side effects and non-pharmacologic comfort measures Outcome: Progressing   Problem: Health Behavior/Discharge Planning: Goal: Ability to manage health-related needs will improve Outcome: Progressing   Problem: Clinical Measurements: Goal: Ability to maintain clinical measurements within normal limits will improve Outcome: Progressing Goal: Will remain free from infection Outcome: Progressing Goal: Diagnostic test results will improve Outcome: Progressing Goal: Respiratory complications will improve Outcome: Progressing Goal: Cardiovascular complication will be avoided Outcome: Progressing   Problem: Activity: Goal: Risk for activity intolerance will decrease Outcome: Progressing   Problem: Nutrition: Goal: Adequate nutrition will be maintained Outcome: Progressing   Problem: Coping: Goal: Level of anxiety will decrease Outcome: Progressing   Problem: Elimination: Goal: Will not experience complications related to bowel motility Outcome: Progressing Goal: Will not experience complications related to urinary retention Outcome: Progressing   Problem: Pain Managment: Goal: General experience of comfort will improve and/or be controlled Outcome: Progressing   Problem: Safety: Goal: Ability to remain free from injury will improve Outcome: Progressing   Problem: Skin Integrity: Goal: Risk for impaired skin integrity will decrease Outcome: Progressing   Problem: Education: Goal: Will demonstrate proper wound care and an understanding of methods to prevent future damage Outcome: Progressing Goal: Knowledge of disease or condition will  improve Outcome: Progressing Goal: Knowledge of the prescribed therapeutic regimen will improve Outcome: Progressing Goal: Individualized Educational Video(s) Outcome: Progressing   Problem: Activity: Goal: Risk for activity intolerance will decrease Outcome: Progressing   Problem: Cardiac: Goal: Will achieve and/or maintain hemodynamic stability Outcome: Progressing   Problem: Clinical Measurements: Goal: Postoperative complications will be avoided or minimized Outcome: Progressing   Problem: Respiratory: Goal: Respiratory status will improve Outcome: Progressing   Problem: Skin Integrity: Goal: Wound healing without signs and symptoms of infection Outcome: Progressing Goal: Risk for impaired skin integrity will decrease Outcome:  Progressing

## 2023-12-20 NOTE — Progress Notes (Signed)
 1 Day Post-Op Procedure(s) (LRB): CORONARY ARTERY BYPASS GRAFTING (CABG) TIMES FIVE USING LEFT INTERNAL MAMMARY ARTERY AND BILATERAL ENDOSCOPICALLY HARVESTED GREATER SAPHENOUS VEIN (N/A) TRANSESOPHAGEAL ECHOCARDIOGRAM (TEE) (N/A) Subjective: No complaints. Stood up this am without difficulty  Objective: Vital signs in last 24 hours: Temp:  [96.3 F (35.7 C)-99.7 F (37.6 C)] 99.3 F (37.4 C) (02/05 0600) Pulse Rate:  [52-80] 75 (02/05 0600) Cardiac Rhythm: Normal sinus rhythm (02/05 0425) Resp:  [10-19] 16 (02/05 0600) BP: (86-117)/(43-78) 117/60 (02/05 0600) SpO2:  [94 %-100 %] 96 % (02/05 0600) Arterial Line BP: (87-139)/(35-64) 136/44 (02/05 0600) FiO2 (%):  [40 %-50 %] 40 % (02/04 1648) Weight:  [96.7 kg] 96.7 kg (02/05 0500)  Hemodynamic parameters for last 24 hours: PAP: (19-34)/(4-18) 27/8 CO:  [3.5 L/min-7.2 L/min] 6.6 L/min CI:  [1.69 L/min/m2-3.48 L/min/m2] 3.18 L/min/m2  Intake/Output from previous day: 02/04 0701 - 02/05 0700 In: 5697.9 [I.V.:3742.2; Blood:285; IV Piggyback:1670.7] Out: 3832 [Urine:2740; Blood:610; Chest Tube:482] Intake/Output this shift: No intake/output data recorded.  General appearance: alert and cooperative Neurologic: intact Heart: regular rate and rhythm Lungs: clear to auscultation bilaterally Extremities: edema mild Wound: dressings dry  Lab Results: Recent Labs    12/19/23 2050 12/20/23 0431  WBC 8.7 8.0  HGB 10.4* 10.7*  HCT 30.7* 31.9*  PLT 103* 107*   BMET:  Recent Labs    12/19/23 2050 12/20/23 0431  NA 139 139  K 4.2 4.1  CL 109 108  CO2 24 23  GLUCOSE 107* 91  BUN 11 10  CREATININE 0.93 0.74  CALCIUM  7.8* 7.9*    PT/INR:  Recent Labs    12/19/23 1436  LABPROT 19.1*  INR 1.6*   ABG    Component Value Date/Time   PHART 7.340 (L) 12/19/2023 1836   HCO3 24.0 12/19/2023 1836   TCO2 25 12/19/2023 1836   ACIDBASEDEF 2.0 12/19/2023 1836   O2SAT 98 12/19/2023 1836   CBG (last 3)  Recent Labs     12/20/23 0024 12/20/23 0221 12/20/23 0430  GLUCAP 120* 87 88   CXR clear  ECG: sinus 70's, no acute changes  Assessment/Plan: S/P Procedure(s) (LRB): CORONARY ARTERY BYPASS GRAFTING (CABG) TIMES FIVE USING LEFT INTERNAL MAMMARY ARTERY AND BILATERAL ENDOSCOPICALLY HARVESTED GREATER SAPHENOUS VEIN (N/A) TRANSESOPHAGEAL ECHOCARDIOGRAM (TEE) (N/A)  POD 1 Hemodynamically stable in sinus rhythm. Continue low dose Lopressor .  DC pacing wires and if stable afterwards remove both chest tubes two hours later.  DC swan, arterial line after pacing wires out.  Wt is 13 lbs over preop. Start diuresis later today.  Glucose under good control with no hx of DM and normal Hgb A1c. Will continue SSI today and probably stop tomorrow.  IS, OOB, mobilize.    LOS: 4 days    Terry Vega 12/20/2023

## 2023-12-21 ENCOUNTER — Inpatient Hospital Stay (HOSPITAL_COMMUNITY): Payer: Medicare HMO

## 2023-12-21 DIAGNOSIS — I200 Unstable angina: Secondary | ICD-10-CM | POA: Diagnosis not present

## 2023-12-21 LAB — GLUCOSE, CAPILLARY
Glucose-Capillary: 96 mg/dL (ref 70–99)
Glucose-Capillary: 141 mg/dL — ABNORMAL HIGH (ref 70–99)

## 2023-12-21 LAB — BASIC METABOLIC PANEL
Anion gap: 11 (ref 5–15)
BUN: 13 mg/dL (ref 8–23)
CO2: 24 mmol/L (ref 22–32)
Calcium: 8.4 mg/dL — ABNORMAL LOW (ref 8.9–10.3)
Chloride: 103 mmol/L (ref 98–111)
Creatinine, Ser: 0.83 mg/dL (ref 0.61–1.24)
GFR, Estimated: >60 mL/min (ref >60)
Glucose, Bld: 109 mg/dL — ABNORMAL HIGH (ref 70–99)
Potassium: 3.8 mmol/L (ref 3.5–5.1)
Sodium: 138 mmol/L (ref 135–145)

## 2023-12-21 LAB — CBC
HCT: 33.3 % — ABNORMAL LOW (ref 39.0–52.0)
Hemoglobin: 11.3 g/dL — ABNORMAL LOW (ref 13.0–17.0)
MCH: 31.6 pg (ref 26.0–34.0)
MCHC: 33.9 g/dL (ref 30.0–36.0)
MCV: 93 fL (ref 80.0–100.0)
Platelets: 108 10*3/uL — ABNORMAL LOW (ref 150–400)
RBC: 3.58 MIL/uL — ABNORMAL LOW (ref 4.22–5.81)
RDW: 13 % (ref 11.5–15.5)
WBC: 9 10*3/uL (ref 4.0–10.5)
nRBC: 0 % (ref 0.0–0.2)

## 2023-12-21 MED ORDER — METOPROLOL TARTRATE 25 MG PO TABS
25.0000 mg | ORAL_TABLET | Freq: Two times a day (BID) | ORAL | Status: DC
  Administered 2023-12-21 – 2023-12-24 (×6): 25 mg via ORAL
  Filled 2023-12-21 (×6): qty 1

## 2023-12-21 MED ORDER — METOPROLOL TARTRATE 25 MG PO TABS
25.0000 mg | ORAL_TABLET | Freq: Two times a day (BID) | ORAL | Status: DC
  Administered 2023-12-21: 25 mg via ORAL
  Filled 2023-12-21: qty 1

## 2023-12-21 MED ORDER — SODIUM CHLORIDE 0.9% FLUSH
3.0000 mL | Freq: Two times a day (BID) | INTRAVENOUS | Status: DC
  Administered 2023-12-21 – 2023-12-23 (×6): 3 mL via INTRAVENOUS

## 2023-12-21 MED ORDER — FUROSEMIDE 40 MG PO TABS
40.0000 mg | ORAL_TABLET | Freq: Every day | ORAL | Status: AC
  Administered 2023-12-21 – 2023-12-23 (×3): 40 mg via ORAL
  Filled 2023-12-21 (×3): qty 1

## 2023-12-21 MED ORDER — ~~LOC~~ CARDIAC SURGERY, PATIENT & FAMILY EDUCATION: Freq: Once | Status: AC

## 2023-12-21 MED ORDER — POTASSIUM CHLORIDE CRYS ER 20 MEQ PO TBCR
20.0000 meq | EXTENDED_RELEASE_TABLET | Freq: Two times a day (BID) | ORAL | Status: AC
  Administered 2023-12-21 – 2023-12-23 (×6): 20 meq via ORAL
  Filled 2023-12-21 (×6): qty 1

## 2023-12-21 MED ORDER — SODIUM CHLORIDE 0.9 % IV SOLN: 250.0000 mL | INTRAVENOUS | Status: AC | PRN

## 2023-12-21 MED ORDER — ONDANSETRON HCL 4 MG/2ML IJ SOLN: 4.0000 mg | Freq: Four times a day (QID) | INTRAMUSCULAR | Status: DC | PRN

## 2023-12-21 MED ORDER — ONDANSETRON HCL 4 MG PO TABS: 4.0000 mg | ORAL_TABLET | Freq: Four times a day (QID) | ORAL | Status: DC | PRN

## 2023-12-21 MED ORDER — METOPROLOL TARTRATE 25 MG/10 ML ORAL SUSPENSION
25.0000 mg | Freq: Two times a day (BID) | ORAL | Status: DC
  Filled 2023-12-21: qty 10

## 2023-12-21 MED ORDER — ASPIRIN 325 MG PO TBEC
325.0000 mg | DELAYED_RELEASE_TABLET | Freq: Every day | ORAL | Status: DC
  Administered 2023-12-22 – 2023-12-24 (×3): 325 mg via ORAL
  Filled 2023-12-21 (×3): qty 1

## 2023-12-21 MED ORDER — DOCUSATE SODIUM 100 MG PO CAPS
200.0000 mg | ORAL_CAPSULE | Freq: Every day | ORAL | Status: DC
  Administered 2023-12-21 – 2023-12-24 (×4): 200 mg via ORAL
  Filled 2023-12-21 (×4): qty 2

## 2023-12-21 MED ORDER — ORAL CARE MOUTH RINSE: 15.0000 mL | OROMUCOSAL | Status: DC | PRN

## 2023-12-21 MED ORDER — LACTULOSE 10 GM/15ML PO SOLN
20.0000 g | Freq: Every day | ORAL | Status: DC | PRN
  Administered 2023-12-22: 20 g via ORAL
  Filled 2023-12-21: qty 30

## 2023-12-21 MED ORDER — SODIUM CHLORIDE 0.9% FLUSH: 3.0000 mL | INTRAVENOUS | Status: DC | PRN

## 2023-12-21 MED FILL — Heparin Sodium (Porcine) Inj 1000 Unit/ML: Qty: 1000 | Status: AC

## 2023-12-21 MED FILL — Magnesium Sulfate Inj 50%: INTRAMUSCULAR | Qty: 10 | Status: AC

## 2023-12-21 MED FILL — Potassium Chloride Inj 2 mEq/ML: INTRAVENOUS | Qty: 40 | Status: AC

## 2023-12-21 NOTE — Discharge Summary (Addendum)
 Physician Discharge Summary  Patient ID: Terry Vega MRN: [id number] DOB/AGE: 78-Oct-1947 78 y.o.  Admit date: 12/16/2023 Discharge date: 12/24/2023  Admission Diagnoses:  Coronary artery disease Unstable angina pectoris Hypertension Hypothyroidism Arthritis  Discharge Diagnoses:  Coronary artery disease Unstable angina pectoris Hypertension Hypothyroidism Arthritis Frequent PVCs S/P CABG x 5 Expected acute blood loss anemia   Discharged Condition: Stable  Referring: Darron Deatrice LABOR, MD Primary Care: Levora Reyes SAUNDERS, MD Primary Cardiologist:Muhammad Darron, MD  History of Present Illness:   We are asked to see this 78 year old male with history of hypertension, PVCs, diastolic dysfunction and hypothyroidism in cardiothoracic surgical consultation for consideration of coronary artery surgical revascularization.  He was previously diagnosed with PVCs in 2014.  He underwent cardiology evaluation including echocardiogram which showed at that time with normal left ventricular ejection fraction.  Stress testing showed no evidence of ischemia.  He was initially treated with diltiazem  but later developed lower extremity swelling and had been changed to Coreg .  Follow-up echocardiogram in September 2022 showed EF 60 to 65%, mild LVH, grade 1 diastolic dysfunction, mild dilated LA, mild MR/AI, and mild aortic valve sclerosis.  He was recently seen in the cardiology office with complaints of exertional chest pressure and dyspnea.  He was subsequently set up for left heart catheterization which was done on 12/15/2023 and showed moderately calcified arteries with severe three-vessel CAD.  Due to the severity of these findings he was felt to require transfer to [facility] for CT surgical evaluation/possible CABG.  Cardiac risk factors also include hyperlipidemia.  He has no previous history of tobacco abuse.  The echocardiogram showed preserved left ventricular function and no significant  valvular disease.  Terry Vega was evaluated by Dr. Lucas and coronary bypass grafting was offered to the patient.  Terry Vega decided to proceed with surgery.  Hospital Course: Terry Vega was taken to the operative room on 12/19/2023 with 5 vessel coronary bypass grafting was carried out with a left internal mammary artery graft into the left anterior descending coronary artery.  Separate saphenous vein grafts were placed to the first and second diagonal, obtuse marginal, and distal right coronary arteries.  Following the procedure, he separated from cardiopulmonary bypass without any difficulty.  He was transferred to the surgical ICU in stable condition.  Vital signs and hemodynamics were stable. He was extubated by 5pm on the day of surgery.  The monitoring lines and pacer wires were removed routinely on post-op day 1 and he was mobilized and quickly progressed to independent ambulation. By post-op day 2, he was ready for transfer to 4E Progressive Care. He was diuresed with appropriate response.  Diet and activity were advanced and well-tolerated.  He had return of bowel function.   All wounds are clean, dry, healing without signs of infection. Per Dr. Lucas, at discharge, stop Lopressor  and restart Coreg  at discharge. His BP will not allow for restart Hyzaar so will do when able as an outpatient. He is stable for discharge today.  Consults: None  Significant Diagnostic Studies:   Treatments: Surgery  Narrative & Impression  CLINICAL DATA:  History of CABG.   EXAM: PORTABLE  Narrative & Impression  CLINICAL DATA:  Pleural effusion.  Recent CABG.   EXAM: CHEST - 2 VIEW   COMPARISON:  Chest x-ray dated December 21, 2023.   FINDINGS: Stable cardiomediastinal silhouette status post CABG. Normal pulmonary vascularity. More conspicuous small left pleural effusion. Trace right pleural effusion. No consolidation or pneumothorax. No acute osseous abnormality.  IMPRESSION: 1. Small left and  trace right pleural effusions.     Electronically Signed   By: Elsie ONEIDA Shoulder M.D.   On: 12/23/2023 15:37    CHEST 1 VIEW   COMPARISON:  Chest x-ray from yesterday.   FINDINGS: Interval removal of the Swan-Ganz catheter. Right internal jugular sheath remains in place. Stable cardiomediastinal silhouette status post CABG. Normal pulmonary vascularity. No focal consolidation, pleural effusion, or pneumothorax. No acute osseous abnormality.   IMPRESSION: 1. Interval removal of the Swan-Ganz catheter. No acute cardiopulmonary disease.     Electronically Signed   By: Elsie ONEIDA Shoulder M.D.   On: 12/21/2023 10:10   CARDIOVASCULAR SURGERY OPERATIVE NOTE   12/19/2023   Surgeon:  Dorise LOIS Fellers, MD   First Assistant: Laurel Becket,  PA-C:   An experienced assistant was required given the complexity of this surgery and the standard of surgical care. The assistant was needed for endoscopic vein harvest, exposure, dissection, suctioning, retraction of delicate tissues and sutures, instrument exchange and for overall help during this procedure.    Preoperative Diagnosis:  Severe multi-vessel coronary artery disease     Postoperative Diagnosis:  Same    Procedure:   Median Sternotomy Extracorporeal circulation 3.   Coronary artery bypass grafting x 5   Left internal mammary artery graft to the LAD SVG to diagonal 1 SVG to diagonal 2 SVG to OM SVG to distal RCA 4.   Endoscopic vein harvest from the right and left legs by Dr. Fellers on 12/19/2023.     Anesthesia:  General Endotracheal     Clinical History/Surgical Indication:   This 78 year old gentleman present with a 3 wk history of exertional chest discomfort radiating into the left shoulder and arm, relieved with rest. Cath showed severe 3V coronary artery disease with moderately calcified vessels and high grade bifurcation stenoses in the LAD and LCX. LVEF normal with no significant valvular disease. I agree that CABG  is the best treatment for him. I discussed the operative procedure with the patient and his wife and two daughters including alternatives, benefits and risks; including but not limited to bleeding, blood transfusion, infection, stroke, myocardial infarction, graft failure, heart block requiring a permanent pacemaker, organ dysfunction, and death.  Terry Vega understands and agrees to proceed.   Discharge Exam: Blood pressure 112/66, pulse 79, temperature 98.3 F (36.8 C), temperature source Oral, resp. rate 16, height 5' 9 (1.753 m), weight 92.8 kg, SpO2 97%. Cardiovascular: RRR Pulmonary: Clear to auscultation on the right and slightly diminished at left base Abdomen: Soft, non tender, bowel sounds present. Extremities: Mild bilateral lower extremity edema. Wounds: Clean and dry.  No erythema or signs of infection.  Disposition:  Discharge disposition: 01-Home or Self Care       Discharge Instructions     Amb Referral to Cardiac Rehabilitation   Complete by: As directed    Diagnosis: CABG   CABG X ___: 5   After initial evaluation and assessments completed: Virtual Based Care may be provided alone or in conjunction with Phase 2 Cardiac Rehab based on patient barriers.: Yes   Intensive Cardiac Rehabilitation (ICR) [facility] location only OR Traditional Cardiac Rehabilitation (TCR) *If criteria for ICR are not met will enroll in TCR ([facility] only): Yes      Allergies as of 12/24/2023   No Known Allergies      Medication List     STOP taking these medications    aspirin  81 MG tablet Replaced by: aspirin   EC 325 MG tablet   isosorbide  mononitrate 30 MG 24 hr tablet Commonly known as: IMDUR    losartan -hydrochlorothiazide 100-25 MG tablet Commonly known as: HYZAAR   nitroGLYCERIN  0.4 MG SL tablet Commonly known as: NITROSTAT        TAKE these medications    aspirin  EC 325 MG tablet Take 1 tablet (325 mg total) by mouth daily. Replaces: aspirin  81 MG tablet    atorvastatin  40 MG tablet Commonly known as: Lipitor Take 1 tablet (40 mg total) by mouth daily.   carvedilol  6.25 MG tablet Commonly known as: COREG  Take 1 tablet (6.25 mg total) by mouth 2 (two) times daily.   cholecalciferol 1000 units tablet Commonly known as: VITAMIN D  Take 1,000 Units by mouth daily.   furosemide  40 MG tablet Commonly known as: LASIX  Take 1 tablet (40 mg total) by mouth daily. For 5 days then stop   levothyroxine  100 MCG tablet Commonly known as: SYNTHROID  Take 1 tablet (100 mcg total) by mouth daily.   multivitamin tablet Take 1 tablet by mouth daily.   potassium chloride  SA 20 MEQ tablet Commonly known as: KLOR-CON  M Take 1 tablet (20 mEq total) by mouth daily. For 5 days then stop.   PreviDent 5000 Booster Plus 1.1 % Pste Generic drug: Sodium Fluoride Place 1 application  onto teeth at bedtime.   traMADol  50 MG tablet Commonly known as: ULTRAM  Take 1 tablet (50 mg total) by mouth every 6 (six) hours as needed for moderate pain (pain score 4-6).   triamcinolone  cream 0.1 % Commonly known as: KENALOG  Apply 1 Application topically daily as needed (irritation).   vitamin E 1000 UNIT capsule Take 1,000 Units by mouth daily.        Follow-up Information     [facility]. Go on 01/02/2024.   Specialty: Cardiothoracic Surgery Why: Your appointment for suture removal is at 10:30am. Contact information: [address] [employer], [address] [place]  [address] [phone]        Vivienne Lonni Ingle, NP. Go on 01/02/2024.   Specialties: Cardiology, Radiology Why: Your cardiology follow up appointment is at 2:20pm. Contact information: [address] [place] [address] [phone]         Lucas Dorise POUR, MD. Go on 01/18/2024.   Specialty: Cardiothoracic Surgery Why: Your appointment is at 3:30pm. Please obtain a chest x-ray at 2:30pm by [facility] located at  [address] Contact information: [address] [employer] [address] [place] [address] [phone]         [facility]. Go on 01/18/2024.   Why: Chest x-ray at 2:30pm. Contact information: [address] [place]  [phone]                The patient has been discharged on:   1.Beta Blocker:  Yes [ x ]                              No   [   ]                              If No, reason:  2.Ace Inhibitor/ARB: Yes [x]   No  [    ]                                     If No, reason:  3.Statin:   Yes [x ]                  No  [   ]                  If No, reason:  4.Ecasa:  Yes  [ x ]                  No   [   ]                  If No, reason:  5. ACS on Admission? No  P2Y12 Inhibitor:  Yes  [   ]                                No  [x ]    Signed: Kyla CHRISTELLA Donald PA-C 12/24/2023, 8:09 AM

## 2023-12-21 NOTE — Progress Notes (Signed)
   Patient Name: ARVLE GRABE Date of Encounter: 12/21/2023 [facility] Cardiologist: Deatrice Cage, MD   Interval Summary  .    Feels well No complaints  Vital Signs .    Vitals:   12/21/23 0736 12/21/23 0745 12/21/23 0800 12/21/23 0809  BP:   122/64   Pulse: 79 75 69   Resp: (!) 8 (!) 23 18   Temp:    98.4 F (36.9 C)  TempSrc:    Oral  SpO2: 98% 93% 91%   Weight:      Height:        Intake/Output Summary (Last 24 hours) at 12/21/2023 0828 Last data filed at 12/21/2023 0600 Gross per 24 hour  Intake 535.58 ml  Output 2195 ml  Net -1659.42 ml      12/21/2023    5:00 AM 12/20/2023    5:00 AM 12/19/2023    3:48 AM  Last 3 Weights  Weight (lbs) 207 lb 3.7 oz 213 lb 3 oz 200 lb 6.4 oz  Weight (kg) 94 kg 96.7 kg 90.9 kg      Telemetry/ECG    Personally Reviewed Telemetry 12/21/2023: No significant arrhythmia  Physical Exam .   Physical Exam Vitals and nursing note reviewed.  Constitutional:      General: He is not in acute distress. Neck:     Vascular: No JVD.  Cardiovascular:     Rate and Rhythm: Normal rate and regular rhythm.     Heart sounds: Normal heart sounds. No murmur heard.    Comments: Pacing wire, Swan, chest tube in place Pulmonary:     Effort: Pulmonary effort is normal.     Breath sounds: Normal breath sounds. No wheezing or rales.  Musculoskeletal:     Right lower leg: Edema (1+) present.     Left lower leg: Edema (1+) present.      Assessment & Plan .     78 y/o male w/hypertension, unstable agnina   Unstable angina: Now s/p CABGX5 (LIMA-LAD, SVG-D1, SVG -D2, SVG-OM, SVG-dRCA) POD#1. Doing well.  Continue Aspirin , Lipitor 40 mg daily, metoprolol  12.5 mg bid. Plan to transition to home meds Coreg  and Hyzar prior to discharge. Still has peripheral edema. Continue IV lasix .  Transfer to 4E today.   For questions or updates, please contact [facility] Please consult [email or web address] for contact info under         Signed, Newman JINNY Lawrence, MD

## 2023-12-21 NOTE — Discharge Instructions (Addendum)

## 2023-12-21 NOTE — Plan of Care (Signed)
 Problem: Education: Goal: Knowledge of General Education information will improve Description: Including pain rating scale, medication(s)/side effects and non-pharmacologic comfort measures Outcome: Progressing   Problem: Health Behavior/Discharge Planning: Goal: Ability to manage health-related needs will improve Outcome: Progressing   Problem: Clinical Measurements: Goal: Ability to maintain clinical measurements within normal limits will improve Outcome: Progressing Goal: Will remain free from infection Outcome: Progressing Goal: Diagnostic test results will improve Outcome: Progressing Goal: Respiratory complications will improve Outcome: Progressing Goal: Cardiovascular complication will be avoided Outcome: Progressing   Problem: Activity: Goal: Risk for activity intolerance will decrease Outcome: Progressing   Problem: Nutrition: Goal: Adequate nutrition will be maintained Outcome: Progressing   Problem: Coping: Goal: Level of anxiety will decrease Outcome: Progressing   Problem: Elimination: Goal: Will not experience complications related to bowel motility Outcome: Progressing Goal: Will not experience complications related to urinary retention Outcome: Progressing   Problem: Pain Managment: Goal: General experience of comfort will improve and/or be controlled Outcome: Progressing   Problem: Safety: Goal: Ability to remain free from injury will improve Outcome: Progressing   Problem: Skin Integrity: Goal: Risk for impaired skin integrity will decrease Outcome: Progressing   Problem: Education: Goal: Understanding of cardiac disease, CV risk reduction, and recovery process will improve Outcome: Progressing Goal: Individualized Educational Video(s) Outcome: Progressing   Problem: Activity: Goal: Ability to tolerate increased activity will improve Outcome: Progressing   Problem: Cardiac: Goal: Ability to achieve and maintain adequate cardiovascular  perfusion will improve Outcome: Progressing   Problem: Health Behavior/Discharge Planning: Goal: Ability to safely manage health-related needs after discharge will improve Outcome: Progressing   Problem: Education: Goal: Knowledge of General Education information will improve Description: Including pain rating scale, medication(s)/side effects and non-pharmacologic comfort measures Outcome: Progressing   Problem: Health Behavior/Discharge Planning: Goal: Ability to manage health-related needs will improve Outcome: Progressing   Problem: Clinical Measurements: Goal: Ability to maintain clinical measurements within normal limits will improve Outcome: Progressing Goal: Will remain free from infection Outcome: Progressing Goal: Diagnostic test results will improve Outcome: Progressing Goal: Respiratory complications will improve Outcome: Progressing Goal: Cardiovascular complication will be avoided Outcome: Progressing   Problem: Activity: Goal: Risk for activity intolerance will decrease Outcome: Progressing   Problem: Nutrition: Goal: Adequate nutrition will be maintained Outcome: Progressing   Problem: Coping: Goal: Level of anxiety will decrease Outcome: Progressing   Problem: Elimination: Goal: Will not experience complications related to bowel motility Outcome: Progressing Goal: Will not experience complications related to urinary retention Outcome: Progressing   Problem: Pain Managment: Goal: General experience of comfort will improve and/or be controlled Outcome: Progressing   Problem: Safety: Goal: Ability to remain free from injury will improve Outcome: Progressing   Problem: Skin Integrity: Goal: Risk for impaired skin integrity will decrease Outcome: Progressing   Problem: Education: Goal: Will demonstrate proper wound care and an understanding of methods to prevent future damage Outcome: Progressing Goal: Knowledge of disease or condition will  improve Outcome: Progressing Goal: Knowledge of the prescribed therapeutic regimen will improve Outcome: Progressing Goal: Individualized Educational Video(s) Outcome: Progressing   Problem: Activity: Goal: Risk for activity intolerance will decrease Outcome: Progressing   Problem: Cardiac: Goal: Will achieve and/or maintain hemodynamic stability Outcome: Progressing   Problem: Clinical Measurements: Goal: Postoperative complications will be avoided or minimized Outcome: Progressing   Problem: Respiratory: Goal: Respiratory status will improve Outcome: Progressing   Problem: Skin Integrity: Goal: Wound healing without signs and symptoms of infection Outcome: Progressing Goal: Risk for impaired skin integrity will decrease Outcome:  Progressing

## 2023-12-21 NOTE — Progress Notes (Signed)
 2 Days Post-Op Procedure(s) (LRB): CORONARY ARTERY BYPASS GRAFTING (CABG) TIMES FIVE USING LEFT INTERNAL MAMMARY ARTERY AND BILATERAL ENDOSCOPICALLY HARVESTED GREATER SAPHENOUS VEIN (N/A) TRANSESOPHAGEAL ECHOCARDIOGRAM (TEE) (N/A) Subjective: No complaints, feels well. Ambulated this am, eating breakfast. Doing 1500 on IS.  Objective: Vital signs in last 24 hours: Temp:  [98.5 F (36.9 C)-99.7 F (37.6 C)] 98.8 F (37.1 C) (02/06 0400) Pulse Rate:  [61-78] 66 (02/06 0300) Cardiac Rhythm: Normal sinus rhythm (02/06 0400) Resp:  [10-23] 12 (02/06 0300) BP: (104-159)/(59-95) 115/61 (02/06 0300) SpO2:  [90 %-99 %] 91 % (02/06 0300) Arterial Line BP: (142-164)/(50-62) 164/57 (02/05 1032) Weight:  [94 kg] 94 kg (02/06 0500)  Hemodynamic parameters for last 24 hours: PAP: (33-40)/(13-18) 37/17  Intake/Output from previous day: 02/05 0701 - 02/06 0700 In: 535.6 [I.V.:139.7; IV Piggyback:395.8] Out: 2280 [Urine:2130; Chest Tube:150] Intake/Output this shift: No intake/output data recorded.  General appearance: alert and cooperative Neurologic: intact Heart: regular rate and rhythm Lungs: clear to auscultation bilaterally Extremities: edema minimal Wound: dressings dry  Lab Results: Recent Labs    12/20/23 1659 12/21/23 0557  WBC 9.2 9.0  HGB 11.6* 11.3*  HCT 34.7* 33.3*  PLT 115* 108*   BMET:  Recent Labs    12/20/23 0431 12/20/23 1659  NA 139 135  K 4.1 3.8  CL 108 103  CO2 23 24  GLUCOSE 91 105*  BUN 10 12  CREATININE 0.74 0.79  CALCIUM  7.9* 8.2*    PT/INR:  Recent Labs    12/19/23 1436  LABPROT 19.1*  INR 1.6*   ABG    Component Value Date/Time   PHART 7.340 (L) 12/19/2023 1836   HCO3 24.0 12/19/2023 1836   TCO2 25 12/19/2023 1836   ACIDBASEDEF 2.0 12/19/2023 1836   O2SAT 98 12/19/2023 1836   CBG (last 3)  Recent Labs    12/20/23 1954 12/20/23 2340 12/21/23 0425  GLUCAP 142* 115* 96   CXR: ok  Assessment/Plan: S/P Procedure(s)  (LRB): CORONARY ARTERY BYPASS GRAFTING (CABG) TIMES FIVE USING LEFT INTERNAL MAMMARY ARTERY AND BILATERAL ENDOSCOPICALLY HARVESTED GREATER SAPHENOUS VEIN (N/A) TRANSESOPHAGEAL ECHOCARDIOGRAM (TEE) (N/A)  POD 2  Hemodynamically stable in sinus rhythm. Continue Lopressor . Prior to discharge will probably transition back to Coreg  and Hyzaar that he was on previously.  -1700 cc yesterday. Wt now 5 lbs over preop. Will continue po lasix  for a few days.  DC sleeve and foley.  Transfer to 4E and continue IS, ambulation.   LOS: 5 days    Terry Vega 12/21/2023

## 2023-12-22 DIAGNOSIS — I200 Unstable angina: Secondary | ICD-10-CM | POA: Diagnosis not present

## 2023-12-22 DIAGNOSIS — I10 Essential (primary) hypertension: Secondary | ICD-10-CM | POA: Diagnosis not present

## 2023-12-22 MED FILL — Sodium Chloride IV Soln 0.9%: INTRAVENOUS | Qty: 2000 | Status: AC

## 2023-12-22 MED FILL — Electrolyte-R (PH 7.4) Solution: INTRAVENOUS | Qty: 5000 | Status: AC

## 2023-12-22 MED FILL — Heparin Sodium (Porcine) Inj 1000 Unit/ML: INTRAMUSCULAR | Qty: 10 | Status: AC

## 2023-12-22 MED FILL — Mannitol IV Soln 20%: INTRAVENOUS | Qty: 500 | Status: AC

## 2023-12-22 MED FILL — Sodium Bicarbonate IV Soln 8.4%: INTRAVENOUS | Qty: 50 | Status: AC

## 2023-12-22 MED FILL — Lidocaine HCl Local Soln Prefilled Syringe 100 MG/5ML (2%): INTRAMUSCULAR | Qty: 5 | Status: AC

## 2023-12-22 NOTE — Care Management Important Message (Signed)
 Important Message  Patient Details  Name: Terry Vega MRN: [id number] Date of Birth: Nov 20, 1945   Important Message Given:  Yes - Medicare IM     Janith Melnick 12/22/2023, 10:19 AM

## 2023-12-22 NOTE — TOC Initial Note (Signed)
 Transition of Care [facility]) - Initial/Assessment Note    Patient Details  Name: Terry Vega MRN: [id number] Date of Birth: 1946-01-30  Transition of Care [facility] CM/SW Contact:    Justina Delcia Czar, RN Phone Number: [phone] 12/22/2023, 3:57 PM  Clinical Narrative:                 TOC CM spoke to pt and wife at bedside. Pt reports he was independent pta.  Pt states he has access to DME if needed. Does not feel HH is needed. Wife at home to assist with care.  Pt states he will schedule hospital follow up appt with his PCP.  Will continue to follow for dc needs.   Expected Discharge Plan: Home/Self Care Barriers to Discharge: Continued Medical Work up   Patient Goals and CMS Choice Patient states their goals for this hospitalization and ongoing recovery are:: wants to recover          Expected Discharge Plan and Services   Discharge Planning Services: CM Consult   Living arrangements for the past 2 months: Single Family Home                                      Prior Living Arrangements/Services Living arrangements for the past 2 months: Single Family Home Lives with:: Spouse Patient language and need for interpreter reviewed:: Yes Do you feel safe going back to the place where you live?: Yes      Need for Family Participation in Patient Care: No (Comment) Care giver support system in place?: Yes (comment) Current home services: DME (has access to DME if needed) Criminal Activity/Legal Involvement Pertinent to Current Situation/Hospitalization: No - Comment as needed  Activities of Daily Living   ADL Screening (condition at time of admission) Independently performs ADLs?: Yes (appropriate for developmental age) Is the patient deaf or have difficulty hearing?: No Does the patient have difficulty seeing, even when wearing glasses/contacts?: No Does the patient have difficulty concentrating, remembering, or making decisions?: No  Permission  Sought/Granted Permission sought to share information with : Case Manager, Family Supports, PCP Permission granted to share information with : Yes, Verbal Permission Granted  Share Information with NAME: Sallie Maker     Permission granted to share info w Relationship: wife  Permission granted to share info w Contact Information: [phone]  Emotional Assessment Appearance:: Appears stated age Attitude/Demeanor/Rapport: Engaged Affect (typically observed): Accepting Orientation: : Oriented to Self, Oriented to Place, Oriented to  Time, Oriented to Situation   Psych Involvement: No (comment)  Admission diagnosis:  CAD (coronary artery disease) [I25.10] Unstable angina (HCC) [I20.0] S/P CABG x 5 [Z95.1] Patient Active Problem List   Diagnosis Date Noted   S/P CABG x 5 12/19/2023   Unstable angina (HCC) 12/15/2023   Leg edema 05/06/2014   Frequent PVCs 10/15/2013   Hypertension 10/08/2012   Hypothyroid 10/08/2012   PCP:  Levora Reyes SAUNDERS, MD Pharmacy:   [facility] [address], [place] - [address] [place] [place] [address] Phone: [phone] Fax: [phone]  [facility] - [place], [place] - [address] [place] [address] Phone: [phone] Fax: [phone]     Social Drivers of Health (SDOH) Social History: SDOH Screenings   Food Insecurity: No Food Insecurity (12/16/2023)  Housing: Low Risk  (12/16/2023)  Transportation Needs: No Transportation Needs (  12/16/2023)  Utilities: Not At Risk (12/16/2023)  Alcohol Screen: Low Risk  (05/31/2023)  Depression (PHQ2-9): Low Risk  (08/04/2023)  Financial Resource Strain: Low Risk  (05/31/2023)  Physical Activity: Inactive (05/31/2023)  Social Connections: Socially Integrated (12/16/2023)  Stress: No Stress Concern Present (05/31/2023)  Tobacco Use: Low Risk  (12/19/2023)  Health Literacy: Adequate Health Literacy (05/31/2023)   SDOH Interventions:      Readmission Risk Interventions     No data to display

## 2023-12-22 NOTE — Progress Notes (Signed)
 CARDIAC REHAB PHASE I   Pt returning to chair from bathroom upon arrival to room. Pt mobilizing well, maintaining proper sternal precautions. Pt walked in hallway with mobility team, tolerating well.  Post [facility] education including site care, restrictions, heart healthy diet, sternal precautions, IS use at home, home needs at discharge, exercise guidelines and CRP2 reviewed. All questions and concerns addressed. Will refer to [facility] for CRP2. Will continue to follow.   [id number] Vaughn Asberry Hacking, RN BSN 12/22/2023 12:12 PM

## 2023-12-22 NOTE — Progress Notes (Signed)
 3 Days Post-Op Procedure(s) (LRB): CORONARY ARTERY BYPASS GRAFTING (CABG) TIMES FIVE USING LEFT INTERNAL MAMMARY ARTERY AND BILATERAL ENDOSCOPICALLY HARVESTED GREATER SAPHENOUS VEIN (N/A) TRANSESOPHAGEAL ECHOCARDIOGRAM (TEE) (N/A) Subjective: No complaints. Slept well, ate breakfast. Pain well-controlled. Bowels have moved.  Objective: Vital signs in last 24 hours: Temp:  [98.1 F (36.7 C)-99.4 F (37.4 C)] 98.2 F (36.8 C) (02/07 0314) Pulse Rate:  [63-83] 68 (02/07 0314) Cardiac Rhythm: Normal sinus rhythm (02/06 1905) Resp:  [8-26] 22 (02/07 0633) BP: (114-138)/(62-82) 135/69 (02/07 0314) SpO2:  [90 %-98 %] 97 % (02/07 0314) Weight:  [92.4 kg] 92.4 kg (02/07 9366)  Hemodynamic parameters for last 24 hours:    Intake/Output from previous day: 02/06 0701 - 02/07 0700 In: 240 [P.O.:240] Out: 1300 [Urine:1300] Intake/Output this shift: No intake/output data recorded.  General appearance: alert and cooperative Neurologic: intact Heart: regular rate and rhythm Lungs: clear to auscultation bilaterally Extremities: no edema Wound: incision healing well  Lab Results: Recent Labs    12/20/23 1659 12/21/23 0557  WBC 9.2 9.0  HGB 11.6* 11.3*  HCT 34.7* 33.3*  PLT 115* 108*   BMET:  Recent Labs    12/20/23 1659 12/21/23 0557  NA 135 138  K 3.8 3.8  CL 103 103  CO2 24 24  GLUCOSE 105* 109*  BUN 12 13  CREATININE 0.79 0.83  CALCIUM  8.2* 8.4*    PT/INR:  Recent Labs    12/19/23 1436  LABPROT 19.1*  INR 1.6*   ABG    Component Value Date/Time   PHART 7.340 (L) 12/19/2023 1836   HCO3 24.0 12/19/2023 1836   TCO2 25 12/19/2023 1836   ACIDBASEDEF 2.0 12/19/2023 1836   O2SAT 98 12/19/2023 1836   CBG (last 3)  Recent Labs    12/20/23 2340 12/21/23 0425 12/21/23 0806  GLUCAP 115* 96 141*    Assessment/Plan: S/P Procedure(s) (LRB): CORONARY ARTERY BYPASS GRAFTING (CABG) TIMES FIVE USING LEFT INTERNAL MAMMARY ARTERY AND BILATERAL ENDOSCOPICALLY  HARVESTED GREATER SAPHENOUS VEIN (N/A) TRANSESOPHAGEAL ECHOCARDIOGRAM (TEE) (N/A)  POD 3 Hemodynamically stable in sinus rhythm. Continue Lopressor . He can go home on the Coreg  he was on before.  Wt down 3+ lbs from yesterday. Still about 3 lbs over preop. Will continue lasix  and KCL till discharge and resume Hyzaar at home.  Continue IS, ambulation.  Plan home Sunday if no changes.   LOS: 6 days    Terry Vega 12/22/2023

## 2023-12-22 NOTE — Progress Notes (Signed)
 Mobility Specialist Progress Note:   12/22/23 1034  Mobility  Activity Ambulated with assistance in hallway  Level of Assistance Standby assist, set-up cues, supervision of patient - no hands on  Assistive Device Front wheel [place]  Distance Ambulated (ft) 1000 ft  RUE Weight Bearing Per Provider Order NWB  LUE Weight Bearing Per Provider Order NWB  Activity Response Tolerated well  Mobility Referral Yes  Mobility visit 1 Mobility  Mobility Specialist Start Time (ACUTE ONLY) 1020  Mobility Specialist Stop Time (ACUTE ONLY) 1034  Mobility Specialist Time Calculation (min) (ACUTE ONLY) 14 min   Pt received in chair, agreeable to mobility session. Ambulated in hallway with SV via RW, accompanied by wife. Tolerated well, asx throughout. Returned pt back to room with all needs met.   Pre Mobility: 69 bpm During Mobility: 83 bpm  Post Mobility: 71 bpm  Terry Vega Mobility Specialist Please contact via Special Educational Needs Teacher or  Rehab office at [phone]

## 2023-12-22 NOTE — Progress Notes (Signed)
 Follow up arranged with cardiology on 01/02/24, see AVS

## 2023-12-22 NOTE — Plan of Care (Signed)

## 2023-12-22 NOTE — Progress Notes (Signed)
   Patient Name: SIMONE TUCKEY Date of Encounter: 12/22/2023 [facility] Cardiologist: Deatrice Cage, MD   Interval Summary  .    Feels well Ambulated without any complaints today.  Vital Signs .    Vitals:   12/21/23 2303 12/22/23 0314 12/22/23 0633 12/22/23 0758  BP: 138/68 135/69  120/66  Pulse: 81 68    Resp: 19 18 (!) 22 20  Temp: 98.1 F (36.7 C) 98.2 F (36.8 C)  98.3 F (36.8 C)  TempSrc: Oral Oral  Oral  SpO2: 95% 97%    Weight:   92.4 kg   Height:        Intake/Output Summary (Last 24 hours) at 12/22/2023 1138 Last data filed at 12/21/2023 2307 Gross per 24 hour  Intake 240 ml  Output --  Net 240 ml      12/22/2023    6:33 AM 12/21/2023    5:00 AM 12/20/2023    5:00 AM  Last 3 Weights  Weight (lbs) 203 lb 11.2 oz 207 lb 3.7 oz 213 lb 3 oz  Weight (kg) 92.398 kg 94 kg 96.7 kg      Telemetry/ECG    Personally Reviewed Telemetry 12/21/2023: No significant arrhythmia  Physical Exam .   Physical Exam Vitals and nursing note reviewed.  Constitutional:      General: He is not in acute distress. Neck:     Vascular: No JVD.  Cardiovascular:     Rate and Rhythm: Normal rate and regular rhythm.     Heart sounds: Normal heart sounds. No murmur heard.    Comments: Pacing wire, Swan, chest tube in place Pulmonary:     Effort: Pulmonary effort is normal.     Breath sounds: Normal breath sounds. No wheezing or rales.  Musculoskeletal:     Right lower leg: Edema (Trace) present.     Left lower leg: Edema (Trace) present.      Assessment & Plan .     78 y/o male w/hypertension, unstable agnina   Unstable angina: Now s/p CABGX5 (LIMA-LAD, SVG-D1, SVG -D2, SVG-OM, SVG-dRCA) POD#1. Doing well.  Continue Aspirin , Lipitor 40 mg daily, metoprolol  12.5 mg bid. Okay to transition to Coreg  3.125 mg twice daily before discharge. He does not need home medication Imdur  on discharge, but can resume losartan  but hydrochlorothiazide at lower than home dose of  100-25 mg daily, as tolerated. Continue IV lasix .  Cardiology will sign off.  Will arrange outpatient follow-up in few weeks.  For questions or updates, please contact [facility] Please consult [email or web address] for contact info under        Signed, Newman JINNY Lawrence, MD

## 2023-12-23 ENCOUNTER — Inpatient Hospital Stay (HOSPITAL_COMMUNITY): Payer: Medicare HMO

## 2023-12-23 NOTE — Progress Notes (Signed)
 Mobility Specialist Progress Note:    12/23/23 1205  Mobility  Activity Ambulated with assistance in hallway  Level of Assistance Standby assist, set-up cues, supervision of patient - no hands on  Assistive Device Front wheel Drotar  Distance Ambulated (ft) 1000 ft  RUE Weight Bearing Per Provider Order NWB  LUE Weight Bearing Per Provider Order NWB  Activity Response Tolerated well  Mobility Referral Yes  Mobility visit 1 Mobility  Mobility Specialist Start Time (ACUTE ONLY) 1158  Mobility Specialist Stop Time (ACUTE ONLY) 1205  Mobility Specialist Time Calculation (min) (ACUTE ONLY) 7 min   Pt received in WC from transport, agreeable to ambulate in hallways. SV with RW for safety. Tolerated well, asx throughout. Accompanied with wife. Returned pt to room, left with all needs met, PA at bedside, eager for d/c.    Tarvis Blossom Mobility Specialist Please contact via SecureChat or  Rehab office at [phone]

## 2023-12-23 NOTE — Progress Notes (Signed)
 Pt seen this AM for ambulation. Pt voices that he has already walked in the hallway and will again once his wife arrives. Reviewed move-in-the tube restrictions and activity progression at home. Pt pulled 1750 mL on I/S; encouraged 10 x/hr. Pt had no other questions at his time.  Alm Parkins MS, ACSM-CEP, CCRP  8:10 - 8:25

## 2023-12-23 NOTE — Progress Notes (Addendum)
      [address]       [place] [address]             [phone]        4 Days Post-Op Procedure(s) (LRB): CORONARY ARTERY BYPASS GRAFTING (CABG) TIMES FIVE USING LEFT INTERNAL MAMMARY ARTERY AND BILATERAL ENDOSCOPICALLY HARVESTED GREATER SAPHENOUS VEIN (N/A) TRANSESOPHAGEAL ECHOCARDIOGRAM (TEE) (N/A)  Subjective: Patient sitting in chair. He has no complaints this am and wants to walk.  Objective: Vital signs in last 24 hours: Temp:  [97.9 F (36.6 C)-98.3 F (36.8 C)] 98.3 F (36.8 C) (02/08 0350) Pulse Rate:  [72-86] 72 (02/08 0350) Cardiac Rhythm: Normal sinus rhythm (02/07 1900) Resp:  [15-20] 17 (02/08 0350) BP: (120-140)/(66-73) 120/70 (02/08 0350) SpO2:  [96 %-100 %] 100 % (02/08 0350) Weight:  [92.4 kg] 92.4 kg (02/08 0350)  Pre op weight 90.9 kg Current Weight  12/23/23 92.4 kg      Intake/Output from previous day: 02/07 0701 - 02/08 0700 In: 240 [P.O.:240] Out: -    Physical Exam:  Cardiovascular: RRR Pulmonary: Clear to auscultation bilaterally Abdomen: Soft, non tender, bowel sounds present. Extremities: Mild bilateral lower extremity edema. Wounds: Clean and dry.  No erythema or signs of infection.  Lab Results: CBC: Recent Labs    12/20/23 1659 12/21/23 0557  WBC 9.2 9.0  HGB 11.6* 11.3*  HCT 34.7* 33.3*  PLT 115* 108*   BMET:  Recent Labs    12/20/23 1659 12/21/23 0557  NA 135 138  K 3.8 3.8  CL 103 103  CO2 24 24  GLUCOSE 105* 109*  BUN 12 13  CREATININE 0.79 0.83  CALCIUM  8.2* 8.4*    PT/INR:  Lab Results  Component Value Date   INR 1.6 (H) 12/19/2023   INR 1.1 12/15/2023   ABG:  INR: Will add last result for INR, ABG once components are confirmed Will add last 4 CBG results once components are confirmed  Assessment/Plan:  1. CV - SR. On Lopressor  25 mg bid. Per Dr. Lucas, will stop Lopressor  at discharge and resume Coreg  as taken prior to surgery. Will also try to restart Hyzaar low dose at  discharge. 2.  Pulmonary - On room air. Encourage incentive spirometer. 3. Above pre op weight, requires further diuresis-on Lasix  40 mg daily 4.  Expected post op acute blood loss anemia - Last H and H stable at 11.3 and 33.3 5. Thrombocytopenia-Last platelets 108,000 6. Discharge in am  Donielle M ZimmermanPA-C 7:24 AM   Chart reviewed, patient examined, agree with above.  He looks great. Ambulating well. Plan home in am if no changes.

## 2023-12-24 MED ORDER — FUROSEMIDE 40 MG PO TABS: 40.0000 mg | ORAL_TABLET | Freq: Every day | ORAL | 0 refills | Status: DC

## 2023-12-24 MED ORDER — TRAMADOL HCL 50 MG PO TABS: 50.0000 mg | ORAL_TABLET | Freq: Four times a day (QID) | ORAL | 0 refills | Status: DC | PRN

## 2023-12-24 MED ORDER — FUROSEMIDE 40 MG PO TABS
40.0000 mg | ORAL_TABLET | Freq: Every day | ORAL | Status: DC
  Administered 2023-12-24: 40 mg via ORAL
  Filled 2023-12-24: qty 1

## 2023-12-24 MED ORDER — POTASSIUM CHLORIDE CRYS ER 20 MEQ PO TBCR
20.0000 meq | EXTENDED_RELEASE_TABLET | Freq: Every day | ORAL | Status: DC
  Administered 2023-12-24: 20 meq via ORAL
  Filled 2023-12-24: qty 1

## 2023-12-24 MED ORDER — TRIAMCINOLONE ACETONIDE 0.1 % EX CREA: 1.0000 | TOPICAL_CREAM | Freq: Every day | CUTANEOUS | Status: AC | PRN

## 2023-12-24 MED ORDER — ASPIRIN 325 MG PO TBEC: 325.0000 mg | DELAYED_RELEASE_TABLET | Freq: Every day | ORAL | Status: DC

## 2023-12-24 MED ORDER — POTASSIUM CHLORIDE CRYS ER 20 MEQ PO TBCR: 20.0000 meq | EXTENDED_RELEASE_TABLET | Freq: Every day | ORAL | 0 refills | Status: DC

## 2023-12-24 NOTE — Progress Notes (Signed)
 Discharge instructions provided to the patient, PIV removed, CCMD notified, belongings taken by patient. Patient had no any concerns.

## 2023-12-24 NOTE — Progress Notes (Addendum)
      [address]       [place] [address]             [phone]        5 Days Post-Op Procedure(s) (LRB): CORONARY ARTERY BYPASS GRAFTING (CABG) TIMES FIVE USING LEFT INTERNAL MAMMARY ARTERY AND BILATERAL ENDOSCOPICALLY HARVESTED GREATER SAPHENOUS VEIN (N/A) TRANSESOPHAGEAL ECHOCARDIOGRAM (TEE) (N/A)  Subjective: Patient sitting in chair, eating cereal. He has no complaint and is looking forward to going home.   Objective: Vital signs in last 24 hours: Temp:  [97.9 F (36.6 C)-98.4 F (36.9 C)] 98.3 F (36.8 C) (02/09 0733) Pulse Rate:  [66-88] 79 (02/09 0733) Cardiac Rhythm: Normal sinus rhythm (02/08 2100) Resp:  [14-20] 16 (02/09 0341) BP: (103-142)/(66-85) 112/66 (02/09 0733) SpO2:  [94 %-100 %] 97 % (02/09 0341) Weight:  [92.8 kg] 92.8 kg (02/09 0341)  Pre op weight 90.9 kg Current Weight  12/24/23 92.8 kg      Intake/Output from previous day: 02/08 0701 - 02/09 0700 In: 120 [P.O.:120] Out: -    Physical Exam:  Cardiovascular: RRR Pulmonary: Clear to auscultation on right and slightly diminished at left base Abdomen: Soft, non tender, bowel sounds present. Extremities: Mild bilateral lower extremity edema. Wounds: Clean and dry.  No erythema or signs of infection.  Lab Results: CBC: No results for input(s): WBC, HGB, HCT, PLT in the last 72 hours.  BMET:  No results for input(s): NA, K, CL, CO2, GLUCOSE, BUN, CREATININE, CALCIUM  in the last 72 hours.   PT/INR:  Lab Results  Component Value Date   INR 1.6 (H) 12/19/2023   INR 1.1 12/15/2023   ABG:  INR: Will add last result for INR, ABG once components are confirmed Will add last 4 CBG results once components are confirmed  Assessment/Plan:  1. CV - SR. On Lopressor  25 mg bid. Per Dr. Lucas, will stop Lopressor  at discharge and resume Coreg  as taken prior to surgery. Will also try to restart Hyzaar low dose at discharge. 2.  Pulmonary - On room  air. Encourage incentive spirometer. 3. Above pre op weight, requires further diuresis-on Lasix  40 mg daily 4.  Expected post op acute blood loss anemia - Last H and H stable at 11.3 and 33.3 5. Thrombocytopenia-Last platelets 108,000 6. Chest tube sutures to remain and will remove in the office after discharge;Discharge  Donielle M ZimmermanPA-C 7:45 AM   Chart reviewed, patient examined, agree with above.  He looks and feels great. Plan home today.

## 2023-12-26 ENCOUNTER — Telehealth: Payer: Self-pay | Admitting: *Deleted

## 2023-12-26 ENCOUNTER — Telehealth: Payer: Self-pay | Admitting: Cardiovascular Disease

## 2023-12-26 NOTE — Telephone Encounter (Signed)
Spoke w/ pt.   He reports that he is doing well since his CABG and will keep his appt on 2/18 for suture removal in [place] and then here in [place] to see Ward Givens.  He is appreciative of the call.

## 2023-12-26 NOTE — Transitions of Care (Post Inpatient/ED Visit) (Signed)
12/26/2023  Name: Terry Vega MRN: [id number] DOB: [birth year]  Today's TOC FU Call Status: Today's TOC FU Call Status:: Successful TOC FU Call Completed TOC FU Call Complete Date: 12/26/23 Patient's Name and Date of Birth confirmed.  Transition Care Management Follow-up Telephone Call Date of Discharge: 12/24/23 Discharge Facility: [facility] Type of Discharge: Inpatient Admission Primary Inpatient Discharge Diagnosis:: [place]- surgical CABG x 5 How have you been since you were released from the hospital?: Better ("I am doing fine, not having any problems-- I am independent and the pain is not bad at all.  My wife is here helping me with anything I might need") Any questions or concerns?: No  Items Reviewed: Did you receive and understand the discharge instructions provided?: Yes (thoroughly reviewed with patient who verbalizes good understanding of same) Medications obtained,verified, and reconciled?: Yes (Medications Reviewed) (Full medication reconciliation/ review completed; no concerns or discrepancies identified; confirmed patient obtained/ is taking all newly Rx'd medications as instructed; self-manages medications and denies questions/ concerns around medications today) Any new allergies since your discharge?: No Dietary orders reviewed?: Yes Type of Diet Ordered:: Heart Healthy Low salt Do you have support at home?: Yes People in Home: spouse Name of Support/Comfort Primary Source: Reports independent in self-care activities; supportive spouse assists as/ if needed/ indicated  Medications Reviewed Today: Medications Reviewed Today     Reviewed by Michaela Corner, RN (Registered Nurse) on 12/26/23 at 1142  Med List Status: <None>   Medication Order Taking? Sig Documenting Provider Last Dose Status Informant  aspirin EC 325 MG tablet [id number] Yes Take 1 tablet (325 mg total) by mouth daily. Ardelle Balls, PA-C Taking Active   atorvastatin (LIPITOR) 40 MG  tablet [id number] Yes Take 1 tablet (40 mg total) by mouth daily. [place] Ouch, MD Taking Active   carvedilol (COREG) 6.25 MG tablet [id number] Yes Take 1 tablet (6.25 mg total) by mouth 2 (two) times daily. [place] Ouch, MD Taking Active Self  cholecalciferol (VITAMIN D) 1000 UNITS tablet [id number] Yes Take 1,000 Units by mouth daily. [provider] Taking Active Self  furosemide (LASIX) 40 MG tablet [id number] Yes Take 1 tablet (40 mg total) by mouth daily. For 5 days then stop Doree Fudge M, PA-C Taking Active   levothyroxine (SYNTHROID) 100 MCG tablet [id number] Yes Take 1 tablet (100 mcg total) by mouth daily. Shade Flood, MD Taking Active Self  Multiple Vitamin (MULTIVITAMIN) tablet [id number] Yes Take 1 tablet by mouth daily. [provider] Taking Active Self  potassium chloride SA (KLOR-CON M) 20 MEQ tablet [id number] Yes Take 1 tablet (20 mEq total) by mouth daily. For 5 days then stop. Ardelle Balls, PA-C Taking Active   PREVIDENT 5000 BOOSTER PLUS 1.1 % PSTE [id number] Yes Place 1 application  onto teeth at bedtime. [provider] Taking Active Self  traMADol (ULTRAM) 50 MG tablet [id number] Yes Take 1 tablet (50 mg total) by mouth every 6 (six) hours as needed for moderate pain (pain score 4-6). Doree Fudge M, PA-C Taking Active   triamcinolone cream (KENALOG) 0.1 % [id number] Yes Apply 1 Application topically daily as needed (irritation). Ardelle Balls, PA-C Taking Active   vitamin E 1000 UNIT capsule [id number] Yes Take 1,000 Units by mouth daily. [provider] Taking Active Self           Home Care and Equipment/Supplies: Were Home Health Services Ordered?: No Any new equipment or medical supplies ordered?: No  Functional Questionnaire: Do  you need assistance with bathing/showering or dressing?: No Do you need assistance with meal preparation?: No Do you need assistance with eating?: No Do you have  difficulty maintaining continence: No Do you need assistance with getting out of bed/getting out of a chair/moving?: No Do you have difficulty managing or taking your medications?: Yes (spouse assists/ supervises as indicated)  Follow up appointments reviewed: PCP Follow-up appointment confirmed?: NA (verified not indicated per hospital discharging provider discharge notes) Specialist Hospital Follow-up appointment confirmed?: Yes Date of Specialist follow-up appointment?: 01/02/24 Follow-Up Specialty Provider:: [facility] and cardiology provider office visits on 01/02/24 Do you need transportation to your follow-up appointment?: No Do you understand care options if your condition(s) worsen?: Yes-patient verbalized understanding  SDOH Interventions Today    Flowsheet Row Most Recent Value  SDOH Interventions   Food Insecurity Interventions Intervention Not Indicated  Housing Interventions Intervention Not Indicated  Transportation Interventions Intervention Not Indicated  [normally drives self,  spouse assists as indicated post-recent surgery]  Utilities Interventions Intervention Not Indicated      Interventions Today    Flowsheet Row Most Recent Value  Chronic Disease   Chronic disease during today's visit Other  [[place]- recent CABG x 5]  General Interventions   General Interventions Discussed/Reviewed General Interventions Discussed, Durable Medical Equipment (DME), Doctor Visits  Doctor Visits Discussed/Reviewed Specialist, Doctor Visits Discussed  Durable Medical Equipment (DME) Other  [confirmed not currently requiring/ using assistive devices for ambulation]  PCP/Specialist Visits Compliance with follow-up visit  Nutrition Interventions   Nutrition Discussed/Reviewed Nutrition Discussed  Pharmacy Interventions   Pharmacy Dicussed/Reviewed Pharmacy Topics Discussed  [Full medication review with updating medication list in EHR per patient report]      TOC Interventions Today     Flowsheet Row Most Recent Value  TOC Interventions   TOC Interventions Discussed/Reviewed TOC Interventions Discussed, Post op wound/incision care  [Patient declines need for ongoing/ further care management outreach,  declines enrollment in 30-day TOC program,  provided my direct contact information should questions/ concerns/ needs arise post-TOC call]      Total time spent from review to signing of note/ including any care coordination interventions:  47 minutes  Caryl Pina, RN, BSN, Media planner  Transitions of Care  [facility]  [place] [phone]: direct office

## 2023-12-26 NOTE — Telephone Encounter (Signed)
Patient stated he recently had bypass surgery and wants to know if he still needs to keep his 2/18 appointment.

## 2023-12-27 ENCOUNTER — Encounter: Payer: Self-pay | Admitting: Family Medicine

## 2023-12-27 NOTE — Telephone Encounter (Signed)
Patient is requesting prescription for Lactulose noting it worked well in the hospital and he would like to continue PRN use.   Also asking if he needs post Op appointment with you

## 2023-12-28 NOTE — Telephone Encounter (Signed)
Looks like a hospital follow-up is not indicated for me since he will be followed up by cardiovascular.  Discharged on tramadol but I do not see any stool regimen.  Colace over-the-counter once per day is reasonable as stool softener, MiraLAX if no bowel movement by second day.  If prescription meds needed please schedule appointment at any acute visit or available opening.  Thanks

## 2023-12-29 ENCOUNTER — Other Ambulatory Visit: Payer: Self-pay

## 2023-12-29 DIAGNOSIS — E7841 Elevated Lipoprotein(a): Secondary | ICD-10-CM

## 2024-01-01 ENCOUNTER — Telehealth (HOSPITAL_COMMUNITY): Payer: Self-pay

## 2024-01-01 NOTE — Telephone Encounter (Signed)
 Called patient to see if he is interested in the Cardiac Rehab Program. Patient expressed interest. Explained scheduling process and went over insurance, patient verbalized understanding. Will contact patient for scheduling once f/u has been completed.

## 2024-01-01 NOTE — Telephone Encounter (Signed)
Pt insurance is active and benefits verified through [facility]. Co-pay $25.00, DED $0.00/$0.00 met, out of pocket $6,720.00/$15.00 met, co-insurance 0%. No pre-authorization required. Passport, 01/01/24 @ 12:20PM, REF#[id number]   How many CR sessions are covered? (36 visits for TCR, 72 visits for ICR)72 Is this a lifetime maximum or an annual maximum? Annual Has the member used any of these services to date? No Is there a time limit (weeks/months) on start of program and/or program completion? No     Will contact patient to see if he is interested in the Cardiac Rehab Program. If interested, patient will need to complete follow up appt. Once completed, patient will be contacted for scheduling upon review by the RN Navigator.

## 2024-01-02 ENCOUNTER — Encounter: Payer: Self-pay | Admitting: Nurse Practitioner

## 2024-01-02 ENCOUNTER — Ambulatory Visit: Payer: Self-pay

## 2024-01-02 ENCOUNTER — Ambulatory Visit: Payer: Medicare HMO | Attending: Nurse Practitioner | Admitting: Nurse Practitioner

## 2024-01-02 VITALS — BP 120/78 | HR 73 | Ht 69.0 in | Wt 202.0 lb

## 2024-01-02 DIAGNOSIS — I493 Ventricular premature depolarization: Secondary | ICD-10-CM | POA: Diagnosis not present

## 2024-01-02 DIAGNOSIS — Z4802 Encounter for removal of sutures: Secondary | ICD-10-CM

## 2024-01-02 DIAGNOSIS — I10 Essential (primary) hypertension: Secondary | ICD-10-CM | POA: Diagnosis not present

## 2024-01-02 DIAGNOSIS — I5032 Chronic diastolic (congestive) heart failure: Secondary | ICD-10-CM | POA: Diagnosis not present

## 2024-01-02 DIAGNOSIS — E785 Hyperlipidemia, unspecified: Secondary | ICD-10-CM

## 2024-01-02 DIAGNOSIS — I2510 Atherosclerotic heart disease of native coronary artery without angina pectoris: Secondary | ICD-10-CM | POA: Diagnosis not present

## 2024-01-02 NOTE — Patient Instructions (Signed)
Medication Instructions:  No changes at this time.   *If you need a refill on your cardiac medications before your next appointment, please call your pharmacy*   Lab Work: CBC & BMET today  If you have labs (blood work) drawn today and your tests are completely normal, you will receive your results only by: MyChart Message (if you have MyChart) OR A paper copy in the mail If you have any lab test that is abnormal or we need to change your treatment, we will call you to review the results.   Testing/Procedures: None   Follow-Up: At [facility], you and your health needs are our priority.  As part of our continuing mission to provide you with exceptional heart care, we have created designated Provider Care Teams.  These Care Teams include your primary Cardiologist (physician) and Advanced Practice Providers (APPs -  Physician Assistants and Nurse Practitioners) who all work together to provide you with the care you need, when you need it.    Your next appointment:   4-6 week(s)  Provider:   Lorine Bears, MD or Nicolasa Ducking, NP

## 2024-01-02 NOTE — Progress Notes (Signed)
Patient arrived for nurse visit to remove sutures post-CABG 2/4 by Dr. Laneta Simmers.  Two sutures removed with no signs or symptoms of infection noted.  Incisions well approximated.  Patient tolerated suture removal well.  Patient and family instructed to keep the incision site clean and dry. Patient and family acknowledged instructions given.  All questions answered.

## 2024-01-02 NOTE — Progress Notes (Signed)
Office Visit    Patient Name: Terry Vega Date of Encounter: 01/02/2024  Primary Care Provider:  Shade Flood, MD Primary Cardiologist:  Lorine Bears, MD  Chief Complaint    78 y.o. male with a history of CAD, hypertension, PVCs, diastolic dysfunction, lower extremity edema, and hypothyroidism, who presents for follow-up after recent CABG x 5.  Past Medical History  Subjective   Past Medical History:  Diagnosis Date   Allergy    Arthritis    [employer] 07/21/2020   CAD (coronary artery disease)    a. 11/2023 Cath: LM 26m/d, LAD 70p/m, D1 99, D2 80, LCX 70ost, 22m/d, OM2 90, RCA 40p/m, 90d, EF 55-65%; b. 12/2023 CABG x 5: LIMA->LAD, VG->D1, VG->D2, VG->OM, VG->dRCA.   Diastolic dysfunction    a. 07/2021 Echo: EF 60-65%.  Mild LVH.  Grade 1 DD.  Normal RV fxn. mildly dil LA. Triv MR/AI. Mild AoV sclerosis.   Heart murmur    Hypertension    Hypothyroidism    PVC's (premature ventricular contractions)    Past Surgical History:  Procedure Laterality Date   COLONOSCOPY     CORONARY ARTERY BYPASS GRAFT N/A 12/19/2023   Procedure: CORONARY ARTERY BYPASS GRAFTING (CABG) TIMES FIVE USING LEFT INTERNAL MAMMARY ARTERY AND BILATERAL ENDOSCOPICALLY HARVESTED GREATER SAPHENOUS VEIN;  Surgeon: Alleen Borne, MD;  Location: [facility] OR;  Service: Open Heart Surgery;  Laterality: N/A;   HEMORRHOID SURGERY     LEFT HEART CATH AND CORONARY ANGIOGRAPHY Left 12/15/2023   Procedure: LEFT HEART CATH AND CORONARY ANGIOGRAPHY;  Surgeon: Iran Ouch, MD;  Location: [facility] INVASIVE CV LAB;  Service: Cardiovascular;  Laterality: Left;   TEE WITHOUT CARDIOVERSION N/A 12/19/2023   Procedure: TRANSESOPHAGEAL ECHOCARDIOGRAM (TEE);  Surgeon: Alleen Borne, MD;  Location: [facility] OR;  Service: Open Heart Surgery;  Laterality: N/A;   VASECTOMY      Allergies  No Known Allergies    History of Present Illness      78 y.o. y/o male with the above past medical history including CAD, hypertension, PVCs,  diastolic dysfunction, lower extremity edema, and hypothyroidism.  He was previously diagnosed with PVCs in 2014.  Echocardiogram at that time showed normal LV function.  Stress testing showed no evidence of ischemia.  He was initially managed with diltiazem however, this resulted in lower extremity swelling and he was switched to carvedilol, which she has tolerated well.  A follow-up echocardiogram in September 2022 showed an EF of 60 to 65% with mild LVH, grade 1 diastolic dysfunction, mildly dilated LA, mild MR/AI, and mild aortic valve sclerosis.   In January 2025, patient was seen with progressive unstable angina and dyspnea.  Diagnostic catheterization revealed severe, multivessel CAD with normal LV function.  He was transferred to [facility] and underwent CABG x 5 with placement of a LIMA to the LAD, vein graft to the first diagonal, vein graft to the second diagonal, vein graft to the obtuse marginal, and vein graft to the distal RCA.  Postoperative course was relatively uncomplicated   Since his hospitalization, he has done quite well.  He has weaned himself down on tramadol to just every 12 hours and even thinks at this point, he is ready to transition to Tylenol as needed.  He has been walking some around his home and has not been experiencing angina or dyspnea.  Though he initially had lower extremity swelling at discharge, he feels that this is more or less resolved and he is off of  furosemide.  His surgical incisions have been healing well.  He denies palpitations, PND, orthopnea, dizziness, syncope, edema, or early satiety. Objective  Home Medications    Current Outpatient Medications  Medication Sig Dispense Refill   aspirin EC 325 MG tablet Take 1 tablet (325 mg total) by mouth daily.     atorvastatin (LIPITOR) 40 MG tablet Take 1 tablet (40 mg total) by mouth daily. 30 tablet 6   carvedilol (COREG) 6.25 MG tablet Take 1 tablet (6.25 mg total) by mouth 2 (two) times daily. 180 tablet 3    cholecalciferol (VITAMIN D) 1000 UNITS tablet Take 1,000 Units by mouth daily.     levothyroxine (SYNTHROID) 100 MCG tablet Take 1 tablet (100 mcg total) by mouth daily. 90 tablet 3   Multiple Vitamin (MULTIVITAMIN) tablet Take 1 tablet by mouth daily.     PREVIDENT 5000 BOOSTER PLUS 1.1 % PSTE Place 1 application  onto teeth at bedtime.     traMADol (ULTRAM) 50 MG tablet Take 1 tablet (50 mg total) by mouth every 6 (six) hours as needed for moderate pain (pain score 4-6). 28 tablet 0   triamcinolone cream (KENALOG) 0.1 % Apply 1 Application topically daily as needed (irritation).     vitamin E 1000 UNIT capsule Take 1,000 Units by mouth daily.     No current facility-administered medications for this visit.     Physical Exam    VS:  BP 120/78 (BP Location: Left Arm, Patient Position: Sitting, Cuff Size: Normal)   Pulse 73   Ht 5\' 9"  (1.753 m)   Wt 202 lb (91.6 kg)   SpO2 95%   BMI 29.83 kg/m  , BMI Body mass index is 29.83 kg/m.       Cardiac Rehabilitation Eligibility Assessment  The patient is ready to start cardiac rehabilitation pending clearance from the cardiac surgeon.   GEN: Well nourished, well developed, in no acute distress. HEENT: normal. Neck: Supple, no JVD, carotid bruits, or masses. Cardiac: RRR, no murmurs, rubs, or gallops. No clubbing, cyanosis, trace bilateral ankle edema.  Radials 2+/PT 2+ and equal bilaterally.  Sternal and bilateral lower extremity incisions (R medial calf, L medial lower thigh) are healing well without erythema or drainage. Respiratory:  Respirations regular and unlabored, clear to auscultation bilaterally. GI: Soft, nontender, nondistended, BS + x 4.  Incisions are healing well without erythema or drainage. MS: no deformity or atrophy. Skin: warm and dry, no rash. Neuro:  Strength and sensation are intact. Psych: Normal affect.  Accessory Clinical Findings    ECG personally reviewed by me today - EKG  Interpretation Date/Time:  Tuesday January 02 2024 14:27:18 EST Ventricular Rate:  73 PR Interval:  190 QRS Duration:  106 QT Interval:  358 QTC Calculation: 394 R Axis:   -22  Text Interpretation: Normal sinus rhythm Normal ECG Confirmed by Nicolasa Ducking [phone]) on 01/02/2024 2:42:01 PM  - no acute changes.  Lab Results  Component Value Date   WBC 9.0 12/21/2023   HGB 11.3 (L) 12/21/2023   HCT 33.3 (L) 12/21/2023   MCV 93.0 12/21/2023   PLT 108 (L) 12/21/2023   Lab Results  Component Value Date   CREATININE 0.83 12/21/2023   BUN 13 12/21/2023   NA 138 12/21/2023   K 3.8 12/21/2023   CL 103 12/21/2023   CO2 24 12/21/2023   Lab Results  Component Value Date   ALT 26 12/18/2023   AST 31 12/18/2023   ALKPHOS 29 (L) 12/18/2023  BILITOT 0.7 12/18/2023   Lab Results  Component Value Date   CHOL 152 08/04/2023   HDL 42.00 08/04/2023   LDLCALC 91 08/04/2023   TRIG 97.0 08/04/2023   CHOLHDL 4 08/04/2023    Lab Results  Component Value Date   HGBA1C 5.2 12/18/2023   Lab Results  Component Value Date   TSH 4.40 08/04/2023       Assessment & Plan    1.  Coronary artery disease: Seen in January with unstable angina with subsequent diagnostic catheterization revealing severe, native, multivessel disease.  He is now status post CABG x 5.  He is doing well without dyspnea or angina.  Incisions are healing well.  He has follow-up with CT surgery in early March and does plan to enroll in cardiac rehabilitation thereafter.  He remains on aspirin, statin, beta-blocker.  Follow-up CBC and basic metabolic panel today.  He was previously statin nave and we will plan to follow-up lipids and LFTs at follow-up in 4 to 6 weeks.  2.  Primary hypertension: Blood pressure stable at 120/78 on beta-blocker therapy.  3.  Hyperlipidemia: Previously statin nave with an LDL of 91 in September 2024.  Now on atorvastatin 40 mg daily.  Also noted to have elevated LP(a) of 199.8.  Referral  previously placed for lipid clinic in [place].  4.  PVCs: Quiescent on beta-blocker therapy.  5.  Disposition: Follow-up CBC and basic metabolic panel.  Follow-up in clinic in 4 to 6 weeks.  Plan for lipids and LFTs at that time.  Nicolasa Ducking, NP 01/02/2024, 4:33 PM

## 2024-01-03 LAB — BASIC METABOLIC PANEL
BUN/Creatinine Ratio: 21 (ref 10–24)
BUN: 17 mg/dL (ref 8–27)
CO2: 28 mmol/L (ref 20–29)
Calcium: 9.5 mg/dL (ref 8.6–10.2)
Chloride: 101 mmol/L (ref 96–106)
Creatinine, Ser: 0.82 mg/dL (ref 0.76–1.27)
Glucose: 108 mg/dL — ABNORMAL HIGH (ref 70–99)
Potassium: 4.4 mmol/L (ref 3.5–5.2)
Sodium: 142 mmol/L (ref 134–144)
eGFR: 90 mL/min/{1.73_m2} (ref >59)

## 2024-01-03 LAB — CBC
Hematocrit: 33.4 % — ABNORMAL LOW (ref 37.5–51.0)
Hemoglobin: 11.2 g/dL — ABNORMAL LOW (ref 13.0–17.7)
MCH: 30.7 pg (ref 26.6–33.0)
MCHC: 33.5 g/dL (ref 31.5–35.7)
MCV: 92 fL (ref 79–97)
Platelets: 381 10*3/uL (ref 150–450)
RBC: 3.65 x10E6/uL — ABNORMAL LOW (ref 4.14–5.80)
RDW: 11.8 % (ref 11.6–15.4)
WBC: 7.3 10*3/uL (ref 3.4–10.8)

## 2024-01-17 ENCOUNTER — Other Ambulatory Visit: Payer: Self-pay | Admitting: Surgery

## 2024-01-17 DIAGNOSIS — Z951 Presence of aortocoronary bypass graft: Secondary | ICD-10-CM

## 2024-01-18 ENCOUNTER — Ambulatory Visit
Admission: RE | Admit: 2024-01-18 | Discharge: 2024-01-18 | Disposition: A | Discharge status: Home / Self Care | Source: Ambulatory Visit | Attending: Surgery | Admitting: Surgery

## 2024-01-18 ENCOUNTER — Encounter: Payer: Self-pay | Admitting: Surgery

## 2024-01-18 ENCOUNTER — Ambulatory Visit: Payer: Self-pay | Admitting: Surgery

## 2024-01-18 VITALS — BP 152/81 | HR 69 | Temp 97.9°F | Resp 18 | Ht 69.0 in | Wt 200.0 lb

## 2024-01-18 DIAGNOSIS — Z951 Presence of aortocoronary bypass graft: Secondary | ICD-10-CM | POA: Diagnosis not present

## 2024-01-18 NOTE — Progress Notes (Signed)
 HPI: Patient returns for routine postoperative follow-up having undergone coronary bypass graft surgery x 5 on 12/20/2023. The patient's early postoperative recovery while in the hospital was notable for an uncomplicated postoperative course. Since hospital discharge the patient reports that he has been feeling well.  He is walking without chest pain or shortness of breath.  He has noted mild swelling in his ankles which has been a chronic problem for him.  He was not sent home on diuretic since he was at his preop weight.   Current Outpatient Medications  Medication Sig Dispense Refill   aspirin EC 325 MG tablet Take 1 tablet (325 mg total) by mouth daily.     atorvastatin (LIPITOR) 40 MG tablet Take 1 tablet (40 mg total) by mouth daily. 30 tablet 6   carvedilol (COREG) 6.25 MG tablet Take 1 tablet (6.25 mg total) by mouth 2 (two) times daily. 180 tablet 3   cholecalciferol (VITAMIN D) 1000 UNITS tablet Take 1,000 Units by mouth daily.     levothyroxine (SYNTHROID) 100 MCG tablet Take 1 tablet (100 mcg total) by mouth daily. 90 tablet 3   Multiple Vitamin (MULTIVITAMIN) tablet Take 1 tablet by mouth daily.     PREVIDENT 5000 BOOSTER PLUS 1.1 % PSTE Place 1 application  onto teeth at bedtime.     triamcinolone cream (KENALOG) 0.1 % Apply 1 Application topically daily as needed (irritation).     vitamin E 1000 UNIT capsule Take 1,000 Units by mouth daily.     traMADol (ULTRAM) 50 MG tablet Take 1 tablet (50 mg total) by mouth every 6 (six) hours as needed for moderate pain (pain score 4-6). 28 tablet 0   No current facility-administered medications for this visit.    Physical Exam: BP (!) 152/81 (BP Location: Left Arm, Cuff Size: Normal)   Pulse 69   Temp 97.9 F (36.6 C)   Resp 18   Ht 5\' 9"  (1.753 m)   Wt 200 lb (90.7 kg)   SpO2 96%   BMI 29.53 kg/m  He looks well. Cardiac exam shows regular rate and rhythm with normal heart sounds.  There is no murmur. Lungs are clear. Chest  incision is healing well and the sternum is stable. His leg incisions are healing well.  There is mild bilateral ankle edema.  Diagnostic Tests:  Narrative & Impression  CLINICAL DATA:  Postop CABG.   EXAM: CHEST - 2 VIEW   COMPARISON:  Radiographs 12/23/2023 and 12/21/2023.   FINDINGS: The heart size and mediastinal contours are stable status post median sternotomy and CABG. The bilateral pleural effusions have resolved. No pneumothorax or pulmonary edema. The bones appear unchanged.   IMPRESSION: Interval resolution of bilateral pleural effusions. No acute cardiopulmonary process.     Electronically Signed   By: Carey Bullocks M.D.   On: 01/18/2024 17:12      Impression:  Overall he is doing very well following coronary bypass surgery.  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  I think he can start outpatient cardiac rehab at any time.  I told him to resume his previous losartan/HCTZ 100/25 daily which should help with his peripheral edema.  Plan:  He will continue to follow-up with his PCP and cardiology and will return to see me if he has any problems with his incisions.   Alleen Borne, MD [facility] [phone]

## 2024-01-19 ENCOUNTER — Other Ambulatory Visit: Payer: Self-pay

## 2024-01-19 ENCOUNTER — Telehealth (HOSPITAL_COMMUNITY): Payer: Self-pay

## 2024-01-19 NOTE — Telephone Encounter (Signed)
 Called patient to see if he was interested in participating in the Cardiac Rehab Program. Patient will come in for orientation on 01/22/24 @ 1:30PM and will attend the 1:45PM exercise class.   Pensions consultant.

## 2024-01-19 NOTE — Telephone Encounter (Signed)
  Called pt to confirm appt for 01/19/24 at 1330. Gave pt instructions for appt, what to wear, office address, eating/taking meds before, and if sick to call and reschedule. Pt voiced understanding, all questions answered.   Health history completed? Yes   Jonna Coup, MS, ACSM-CEP 01/19/2024 4:40 PM

## 2024-01-22 ENCOUNTER — Encounter (HOSPITAL_COMMUNITY)
Admission: RE | Admit: 2024-01-22 | Discharge: 2024-01-22 | Disposition: A | Discharge status: Home / Self Care | Source: Ambulatory Visit | Attending: Cardiovascular Disease | Admitting: Cardiovascular Disease

## 2024-01-22 VITALS — BP 106/62 | HR 73 | Ht 67.0 in | Wt 202.2 lb

## 2024-01-22 DIAGNOSIS — Z951 Presence of aortocoronary bypass graft: Secondary | ICD-10-CM | POA: Diagnosis not present

## 2024-01-22 NOTE — Progress Notes (Signed)
 Cardiac Individual Treatment Plan  Patient Details  Name: Terry Vega MRN: [id number] Date of Birth: [birth year] Referring Provider:   Flowsheet Row INTENSIVE CARDIAC REHAB ORIENT from 01/22/2024 in [facility]  Referring Provider Lorine Bears, MD       Initial Encounter Date:  Flowsheet Row INTENSIVE CARDIAC REHAB ORIENT from 01/22/2024 in [facility]  Date 01/22/24       Visit Diagnosis: 12/20/23 CABG x 5  Patient's Home Medications on Admission:  Current Outpatient Medications:    aspirin EC 325 MG tablet, Take 1 tablet (325 mg total) by mouth daily., Disp: , Rfl:    atorvastatin (LIPITOR) 40 MG tablet, Take 1 tablet (40 mg total) by mouth daily., Disp: 30 tablet, Rfl: 6   carvedilol (COREG) 6.25 MG tablet, Take 1 tablet (6.25 mg total) by mouth 2 (two) times daily., Disp: 180 tablet, Rfl: 3   cholecalciferol (VITAMIN D) 1000 UNITS tablet, Take 1,000 Units by mouth daily., Disp: , Rfl:    levothyroxine (SYNTHROID) 100 MCG tablet, Take 1 tablet (100 mcg total) by mouth daily., Disp: 90 tablet, Rfl: 3   losartan-hydrochlorothiazide (HYZAAR) 100-25 MG tablet, Take 1 tablet by mouth daily., Disp: , Rfl:    Multiple Vitamin (MULTIVITAMIN) tablet, Take 1 tablet by mouth daily., Disp: , Rfl:    PREVIDENT 5000 BOOSTER PLUS 1.1 % PSTE, Place 1 application  onto teeth at bedtime., Disp: , Rfl:    triamcinolone cream (KENALOG) 0.1 %, Apply 1 Application topically daily as needed (irritation)., Disp: , Rfl:    vitamin E 1000 UNIT capsule, Take 1,000 Units by mouth daily., Disp: , Rfl:    traMADol (ULTRAM) 50 MG tablet, Take 1 tablet (50 mg total) by mouth every 6 (six) hours as needed for moderate pain (pain score 4-6)., Disp: 28 tablet, Rfl: 0  Past Medical History: Past Medical History:  Diagnosis Date   Allergy    Arthritis    [employer] 07/21/2020   CAD (coronary  artery disease)    a. 11/2023 Cath: LM 64m/d, LAD 70p/m, D1 99, D2 80, LCX 70ost, 18m/d, OM2 90, RCA 40p/m, 90d, EF 55-65%; b. 12/2023 CABG x 5: LIMA->LAD, VG->D1, VG->D2, VG->OM, VG->dRCA.   Diastolic dysfunction    a. 07/2021 Echo: EF 60-65%.  Mild LVH.  Grade 1 DD.  Normal RV fxn. mildly dil LA. Triv MR/AI. Mild AoV sclerosis.   Heart murmur    Hypertension    Hypothyroidism    PVC's (premature ventricular contractions)     Tobacco Use: Social History   Tobacco Use  Smoking Status Never  Smokeless Tobacco Never    Labs: Review Flowsheet  More data exists      Latest Ref Rng & Units 07/28/2021 08/01/2022 08/04/2023 12/18/2023 12/19/2023  Labs for ITP Cardiac and Pulmonary Rehab  Cholestrol 0 - 200 mg/dL 409  811  914  - -  LDL (calc) 0 - 99 mg/dL 782  956  91  - -  HDL-C >39.00 mg/dL 21.30  86.57  84.69  - -  Trlycerides 0.0 - 149.0 mg/dL 62.9  528.4  13.2  - -  Hemoglobin A1c 4.8 - 5.6 % 5.3  - - 5.2  -  PH, Arterial 7.35 - 7.45 - - - 7.45  7.340  7.336  7.406  7.431  7.408  7.402  7.358  7.382   PCO2 arterial 32 - 48 mmHg - - -  43  44.8  45.6  38.2  37.8  40.3  42.0  48.2  43.9   Bicarbonate 20.0 - 28.0 mmol/L - - - 29.9  24.0  24.2  24.3  25.1  25.5  26.1  26.2  27.1  26.1   TCO2 22 - 32 mmol/L - - - - 25  26  26  26  27  27  25  27  27  28  29  26  27  29    Acid-base deficit 0.0 - 2.0 mmol/L - - - - 2.0  2.0  1.0   O2 Saturation % - - - 97.2  98  98  93  100  100  100  84  100  100     Details       Multiple values from one day are sorted in reverse-chronological order         Capillary Blood Glucose: Lab Results  Component Value Date   GLUCAP 141 (H) 12/21/2023   GLUCAP 96 12/21/2023   GLUCAP 115 (H) 12/20/2023   GLUCAP 142 (H) 12/20/2023   GLUCAP 115 (H) 12/20/2023     Exercise Target Goals: Exercise Program Goal: Individual exercise prescription set using results from initial 6 min walk test and THRR while considering  patient's activity barriers and safety.    Exercise Prescription Goal: Initial exercise prescription builds to 30-45 minutes a day of aerobic activity, 2-3 days per week.  Home exercise guidelines will be given to patient during program as part of exercise prescription that the participant will acknowledge.  Activity Barriers & Risk Stratification:  Activity Barriers & Cardiac Risk Stratification - 01/22/24 1409       Activity Barriers & Cardiac Risk Stratification   Activity Barriers Incisional Pain;Balance Concerns;Joint Problems    Cardiac Risk Stratification High   <5 METs on            6 Minute Walk:  6 Minute Walk     Row Name 01/22/24 1539         6 Minute Walk   Phase Initial     Distance 1420 feet     Walk Time 6 minutes     # of Rest Breaks 0     MPH 2.69     METS 2.6     RPE 11     Perceived Dyspnea  0     VO2 Peak 9.09     Symptoms No     Resting HR 75 bpm     Resting BP 106/62     Resting Oxygen Saturation  95 %     Exercise Oxygen Saturation  during 6 min walk 93 %     Max Ex. HR 108 bpm     Max Ex. BP 142/64     2 Minute Post BP 112/68              Oxygen Initial Assessment:   Oxygen Re-Evaluation:   Oxygen Discharge (Final Oxygen Re-Evaluation):   Initial Exercise Prescription:  Initial Exercise Prescription - 01/22/24 1500       Date of Initial Exercise RX and Referring Provider   Date 01/22/24    Referring Provider Lorine Bears, MD    Expected Discharge Date 04/17/24      Recumbant Bike   Level 1    RPM 50    Watts 30    Minutes 15    METs 2      NuStep   Level  2    SPM 75    Minutes 15    METs 2.3      Prescription Details   Frequency (times per week) 3    Duration Progress to 30 minutes of continuous aerobic without signs/symptoms of physical distress      Intensity   THRR 40-80% of Max Heartrate 56-113    Ratings of Perceived Exertion 11-13    Perceived Dyspnea 0-4      Progression   Progression Continue progressive overload as per policy  without signs/symptoms or physical distress.      Resistance Training   Training Prescription Yes    Weight 3    Reps 10-15             Perform Capillary Blood Glucose checks as needed.  Exercise Prescription Changes:   Exercise Comments:   Exercise Goals and Review:   Exercise Goals     Row Name 01/22/24 1409             Exercise Goals   Increase Physical Activity Yes       Intervention Provide advice, education, support and counseling about physical activity/exercise needs.;Develop an individualized exercise prescription for aerobic and resistive training based on initial evaluation findings, risk stratification, comorbidities and participant's personal goals.       Expected Outcomes Short Term: Attend rehab on a regular basis to increase amount of physical activity.;Long Term: Exercising regularly at least 3-5 days a week.;Long Term: Add in home exercise to make exercise part of routine and to increase amount of physical activity.       Increase Strength and Stamina Yes       Intervention Provide advice, education, support and counseling about physical activity/exercise needs.;Develop an individualized exercise prescription for aerobic and resistive training based on initial evaluation findings, risk stratification, comorbidities and participant's personal goals.       Expected Outcomes Short Term: Increase workloads from initial exercise prescription for resistance, speed, and METs.;Short Term: Perform resistance training exercises routinely during rehab and add in resistance training at home;Long Term: Improve cardiorespiratory fitness, muscular endurance and strength as measured by increased METs and functional capacity ( )       Able to understand and use rate of perceived exertion (RPE) scale Yes       Intervention Provide education and explanation on how to use RPE scale       Expected Outcomes Short Term: Able to use RPE daily in rehab to express subjective  intensity level;Long Term:  Able to use RPE to guide intensity level when exercising independently       Knowledge and understanding of Target Heart Rate Range (THRR) Yes       Intervention Provide education and explanation of THRR including how the numbers were predicted and where they are located for reference       Expected Outcomes Short Term: Able to state/look up THRR;Short Term: Able to use daily as guideline for intensity in rehab;Long Term: Able to use THRR to govern intensity when exercising independently       Understanding of Exercise Prescription Yes       Intervention Provide education, explanation, and written materials on patient's individual exercise prescription       Expected Outcomes Short Term: Able to explain program exercise prescription;Long Term: Able to explain home exercise prescription to exercise independently                Exercise Goals Re-Evaluation :   Discharge Exercise Prescription (  Final Exercise Prescription Changes):   Nutrition:  Target Goals: Understanding of nutrition guidelines, daily intake of sodium 1500mg , cholesterol 200mg , calories 30% from fat and 7% or less from saturated fats, daily to have 5 or more servings of fruits and vegetables.  Biometrics:  Pre Biometrics - 01/22/24 1407       Pre Biometrics   Waist Circumference 42.5 inches    Hip Circumference 43.5 inches    Waist to Hip Ratio 0.98 %    Triceps Skinfold 23 mm    % Body Fat 32.4 %    Grip Strength 36 kg    Flexibility 11 in    Single Leg Stand 1.25 seconds              Nutrition Therapy Plan and Nutrition Goals:   Nutrition Assessments:  MEDIFICTS Score Key: >=70 Need to make dietary changes  40-70 Heart Healthy Diet <= 40 Therapeutic Level Cholesterol Diet    Picture Your Plate Scores: <16 Unhealthy dietary pattern with much room for improvement. 41-50 Dietary pattern unlikely to meet recommendations for good health and room for improvement. 51-60  More healthful dietary pattern, with some room for improvement.  >60 Healthy dietary pattern, although there may be some specific behaviors that could be improved.    Nutrition Goals Re-Evaluation:   Nutrition Goals Re-Evaluation:   Nutrition Goals Discharge (Final Nutrition Goals Re-Evaluation):   Psychosocial: Target Goals: Acknowledge presence or absence of significant depression and/or stress, maximize coping skills, provide positive support system. Participant is able to verbalize types and ability to use techniques and skills needed for reducing stress and depression.  Initial Review & Psychosocial Screening:  Initial Psych Review & Screening - 01/22/24 1409       Initial Review   Current issues with None Identified      Family Dynamics   Good Support System? Yes   Wife for support   Comments Terry Vega denies any feelings of anxiety/stress/depression. He did share that he is waking up every 2 hours at night to use the restroom, he said it has become less frequent the past few weeks and he has no trouble falling back asleep. Denies any need for additional resources at this time.      Barriers   Psychosocial barriers to participate in program There are no identifiable barriers or psychosocial needs.      Screening Interventions   Interventions Encouraged to exercise;Provide feedback about the scores to participant    Expected Outcomes Short Term goal: Identification and review with participant of any Quality of Life or Depression concerns found by scoring the questionnaire.;Long Term goal: The participant improves quality of Life and PHQ9 Scores as seen by post scores and/or verbalization of changes             Quality of Life Scores:  Quality of Life - 01/22/24 1542       Quality of Life   Select Quality of Life      Quality of Life Scores   Health/Function Pre 26.2 %    Socioeconomic Pre 28.21 %    Psych/Spiritual Pre 27.21 %    Family Pre 28.8 %    GLOBAL Pre  27.21 %            Scores of 19 and below usually indicate a poorer quality of life in these areas.  A difference of  2-3 points is a clinically meaningful difference.  A difference of 2-3 points in the total score of the Quality  of Life Index has been associated with significant improvement in overall quality of life, self-image, physical symptoms, and general health in studies assessing change in quality of life.  PHQ-9: Review Flowsheet  More data exists      01/22/2024 08/04/2023 05/31/2023 09/01/2022 07/28/2021  Depression screen PHQ 2/9  Decreased Interest 0 0 0 0 0  Down, Depressed, Hopeless 0 0 0 0 0  PHQ - 2 Score 0 0 0 0 0  Altered sleeping 1 0 0 - 0  Tired, decreased energy 1 1 0 - 0  Change in appetite 1 0 0 - 0  Feeling bad or failure about yourself  0 0 0 - 0  Trouble concentrating 0 0 0 - 0  Moving slowly or fidgety/restless 0 0 0 - 0  Suicidal thoughts 0 0 0 - 0  PHQ-9 Score 3 1 0 - 0  Difficult doing work/chores Not difficult at all Not difficult at all Not difficult at all - -   Interpretation of Total Score  Total Score Depression Severity:  1-4 = Minimal depression, 5-9 = Mild depression, 10-14 = Moderate depression, 15-19 = Moderately severe depression, 20-27 = Severe depression   Psychosocial Evaluation and Intervention:   Psychosocial Re-Evaluation:   Psychosocial Discharge (Final Psychosocial Re-Evaluation):   Vocational Rehabilitation: Provide vocational rehab assistance to qualifying candidates.   Vocational Rehab Evaluation & Intervention:  Vocational Rehab - 01/22/24 1542       Initial Vocational Rehab Evaluation & Intervention   Assessment shows need for Vocational Rehabilitation No   Terry Vega is retired            Education: Education Goals: Education classes will be provided on a weekly basis, covering required topics. Participant will state understanding/return demonstration of topics presented.     Core Videos: Exercise    Move  It!  Clinical staff conducted group or individual video education with verbal and written material and guidebook.  Patient learns the recommended Pritikin exercise program. Exercise with the goal of living a long, healthy life. Some of the health benefits of exercise include controlled diabetes, healthier blood pressure levels, improved cholesterol levels, improved heart and lung capacity, improved sleep, and better body composition. Everyone should speak with their doctor before starting or changing an exercise routine.  Biomechanical Limitations Clinical staff conducted group or individual video education with verbal and written material and guidebook.  Patient learns how biomechanical limitations can impact exercise and how we can mitigate and possibly overcome limitations to have an impactful and balanced exercise routine.  Body Composition Clinical staff conducted group or individual video education with verbal and written material and guidebook.  Patient learns that body composition (ratio of muscle mass to fat mass) is a key component to assessing overall fitness, rather than body weight alone. Increased fat mass, especially visceral belly fat, can put [place] at increased risk for metabolic syndrome, type 2 diabetes, heart disease, and even death. It is recommended to combine diet and exercise (cardiovascular and resistance training) to improve your body composition. Seek guidance from your physician and exercise physiologist before implementing an exercise routine.  Exercise Action Plan Clinical staff conducted group or individual video education with verbal and written material and guidebook.  Patient learns the recommended strategies to achieve and enjoy long-term exercise adherence, including variety, self-motivation, self-efficacy, and positive decision making. Benefits of exercise include fitness, good health, weight management, more energy, better sleep, less stress, and overall  well-being.  Medical   Heart Disease Risk Reduction  Clinical staff conducted group or individual video education with verbal and written material and guidebook.  Patient learns our heart is our most vital organ as it circulates oxygen, nutrients, white blood cells, and hormones throughout the entire body, and carries waste away. Data supports a plant-based eating plan like the Pritikin Program for its effectiveness in slowing progression of and reversing heart disease. The video provides a number of recommendations to address heart disease.   Metabolic Syndrome and Belly Fat  Clinical staff conducted group or individual video education with verbal and written material and guidebook.  Patient learns what metabolic syndrome is, how it leads to heart disease, and how one can reverse it and keep it from coming back. You have metabolic syndrome if you have 3 of the following 5 criteria: abdominal obesity, high blood pressure, high triglycerides, low HDL cholesterol, and high blood sugar.  Hypertension and Heart Disease Clinical staff conducted group or individual video education with verbal and written material and guidebook.  Patient learns that high blood pressure, or hypertension, is very common in the [place]. Hypertension is largely due to excessive salt intake, but other important risk factors include being overweight, physical inactivity, drinking too much alcohol, smoking, and not eating enough potassium from fruits and vegetables. High blood pressure is a leading risk factor for heart attack, stroke, congestive heart failure, dementia, kidney failure, and premature death. Long-term effects of excessive salt intake include stiffening of the arteries and thickening of heart muscle and organ damage. Recommendations include ways to reduce hypertension and the risk of heart disease.  Diseases of Our Time - Focusing on Diabetes Clinical staff conducted group or individual video education with  verbal and written material and guidebook.  Patient learns why the best way to stop diseases of our time is prevention, through food and other lifestyle changes. Medicine (such as prescription pills and surgeries) is often only a Band-Aid on the problem, not a long-term solution. Most common diseases of our time include obesity, type 2 diabetes, hypertension, heart disease, and cancer. The Pritikin Program is recommended and has been proven to help reduce, reverse, and/or prevent the damaging effects of metabolic syndrome.  Nutrition   Overview of the Pritikin Eating Plan  Clinical staff conducted group or individual video education with verbal and written material and guidebook.  Patient learns about the Pritikin Eating Plan for disease risk reduction. The Pritikin Eating Plan emphasizes a wide variety of unrefined, minimally-processed carbohydrates, like fruits, vegetables, whole grains, and legumes. Go, Caution, and Stop food choices are explained. Plant-based and lean animal proteins are emphasized. Rationale provided for low sodium intake for blood pressure control, low added sugars for blood sugar stabilization, and low added fats and oils for coronary artery disease risk reduction and weight management.  Calorie Density  Clinical staff conducted group or individual video education with verbal and written material and guidebook.  Patient learns about calorie density and how it impacts the Pritikin Eating Plan. Knowing the characteristics of the food you choose will help you decide whether those foods will lead to weight gain or weight loss, and whether you want to consume more or less of them. Weight loss is usually a side effect of the Pritikin Eating Plan because of its focus on low calorie-dense foods.  Label Reading  Clinical staff conducted group or individual video education with verbal and written material and guidebook.  Patient learns about the Pritikin recommended label reading  guidelines and corresponding recommendations regarding calorie density, added  sugars, sodium content, and whole grains.  Dining Out - Part 1  Clinical staff conducted group or individual video education with verbal and written material and guidebook.  Patient learns that restaurant meals can be sabotaging because they can be so high in calories, fat, sodium, and/or sugar. Patient learns recommended strategies on how to positively address this and avoid unhealthy pitfalls.  Facts on Fats  Clinical staff conducted group or individual video education with verbal and written material and guidebook.  Patient learns that lifestyle modifications can be just as effective, if not more so, as many medications for lowering your risk of heart disease. A Pritikin lifestyle can help to reduce your risk of inflammation and atherosclerosis (cholesterol build-up, or plaque, in the artery walls). Lifestyle interventions such as dietary choices and physical activity address the cause of atherosclerosis. A review of the types of fats and their impact on blood cholesterol levels, along with dietary recommendations to reduce fat intake is also included.  Nutrition Action Plan  Clinical staff conducted group or individual video education with verbal and written material and guidebook.  Patient learns how to incorporate Pritikin recommendations into their lifestyle. Recommendations include planning and keeping personal health goals in mind as an important part of their success.  Healthy Mind-Set    Healthy Minds, Bodies, Hearts  Clinical staff conducted group or individual video education with verbal and written material and guidebook.  Patient learns how to identify when they are stressed. Video will discuss the impact of that stress, as well as the many benefits of stress management. Patient will also be introduced to stress management techniques. The way we think, act, and feel has an impact on our hearts.  How Our  Thoughts Can Heal Our Hearts  Clinical staff conducted group or individual video education with verbal and written material and guidebook.  Patient learns that negative thoughts can cause depression and anxiety. This can result in negative lifestyle behavior and serious health problems. Cognitive behavioral therapy is an effective method to help control our thoughts in order to change and improve our emotional outlook.  Additional Videos:  Exercise    Improving Performance  Clinical staff conducted group or individual video education with verbal and written material and guidebook.  Patient learns to use a non-linear approach by alternating intensity levels and lengths of time spent exercising to help burn more calories and lose more body fat. Cardiovascular exercise helps improve heart health, metabolism, hormonal balance, blood sugar control, and recovery from fatigue. Resistance training improves strength, endurance, balance, coordination, reaction time, metabolism, and muscle mass. Flexibility exercise improves circulation, posture, and balance. Seek guidance from your physician and exercise physiologist before implementing an exercise routine and learn your capabilities and proper form for all exercise.  Introduction to Yoga  Clinical staff conducted group or individual video education with verbal and written material and guidebook.  Patient learns about yoga, a discipline of the coming together of mind, breath, and body. The benefits of yoga include improved flexibility, improved range of motion, better posture and core strength, increased lung function, weight loss, and positive self-image. Yoga's heart health benefits include lowered blood pressure, healthier heart rate, decreased cholesterol and triglyceride levels, improved immune function, and reduced stress. Seek guidance from your physician and exercise physiologist before implementing an exercise routine and learn your capabilities and  proper form for all exercise.  Medical   Aging: Enhancing Your Quality of Life  Clinical staff conducted group or individual video education with verbal and  written material and guidebook.  Patient learns key strategies and recommendations to stay in good physical health and enhance quality of life, such as prevention strategies, having an advocate, securing a Health Care Proxy and Power of Attorney, and keeping a list of medications and system for tracking them. It also discusses how to avoid risk for bone loss.  Biology of Weight Control  Clinical staff conducted group or individual video education with verbal and written material and guidebook.  Patient learns that weight gain occurs because we consume more calories than we burn (eating more, moving less). Even if your body weight is normal, you may have higher ratios of fat compared to muscle mass. Too much body fat puts you at increased risk for cardiovascular disease, heart attack, stroke, type 2 diabetes, and obesity-related cancers. In addition to exercise, following the Pritikin Eating Plan can help reduce your risk.  Decoding Lab Results  Clinical staff conducted group or individual video education with verbal and written material and guidebook.  Patient learns that lab test reflects one measurement whose values change over time and are influenced by many factors, including medication, stress, sleep, exercise, food, hydration, pre-existing medical conditions, and more. It is recommended to use the knowledge from this video to become more involved with your lab results and evaluate your numbers to speak with your doctor.   Diseases of Our Time - Overview  Clinical staff conducted group or individual video education with verbal and written material and guidebook.  Patient learns that according to the CDC, 50% to 70% of chronic diseases (such as obesity, type 2 diabetes, elevated lipids, hypertension, and heart disease) are avoidable through  lifestyle improvements including healthier food choices, listening to satiety cues, and increased physical activity.  Sleep Disorders Clinical staff conducted group or individual video education with verbal and written material and guidebook.  Patient learns how good quality and duration of sleep are important to overall health and well-being. Patient also learns about sleep disorders and how they impact health along with recommendations to address them, including discussing with a physician.  Nutrition  Dining Out - Part 2 Clinical staff conducted group or individual video education with verbal and written material and guidebook.  Patient learns how to plan ahead and communicate in order to maximize their dining experience in a healthy and nutritious manner. Included are recommended food choices based on the type of restaurant the patient is visiting.   Fueling a Banker conducted group or individual video education with verbal and written material and guidebook.  There is a strong connection between our food choices and our health. Diseases like obesity and type 2 diabetes are very prevalent and are in large-part due to lifestyle choices. The Pritikin Eating Plan provides plenty of food and hunger-curbing satisfaction. It is easy to follow, affordable, and helps reduce health risks.  Menu Workshop  Clinical staff conducted group or individual video education with verbal and written material and guidebook.  Patient learns that restaurant meals can sabotage health goals because they are often packed with calories, fat, sodium, and sugar. Recommendations include strategies to plan ahead and to communicate with the manager, chef, or server to help order a healthier meal.  Planning Your Eating Strategy  Clinical staff conducted group or individual video education with verbal and written material and guidebook.  Patient learns about the Pritikin Eating Plan and its benefit of  reducing the risk of disease. The Pritikin Eating Plan does not focus on calories.  Instead, it emphasizes high-quality, nutrient-rich foods. By knowing the characteristics of the foods, we choose, we can determine their calorie density and make informed decisions.  Targeting Your Nutrition Priorities  Clinical staff conducted group or individual video education with verbal and written material and guidebook.  Patient learns that lifestyle habits have a tremendous impact on disease risk and progression. This video provides eating and physical activity recommendations based on your personal health goals, such as reducing LDL cholesterol, losing weight, preventing or controlling type 2 diabetes, and reducing high blood pressure.  Vitamins and Minerals  Clinical staff conducted group or individual video education with verbal and written material and guidebook.  Patient learns different ways to obtain key vitamins and minerals, including through a recommended healthy diet. It is important to discuss all supplements you take with your doctor.   Healthy Mind-Set    Smoking Cessation  Clinical staff conducted group or individual video education with verbal and written material and guidebook.  Patient learns that cigarette smoking and tobacco addiction pose a serious health risk which affects millions of people. Stopping smoking will significantly reduce the risk of heart disease, lung disease, and many forms of cancer. Recommended strategies for quitting are covered, including working with your doctor to develop a successful plan.  Culinary   Becoming a Set designer conducted group or individual video education with verbal and written material and guidebook.  Patient learns that cooking at home can be healthy, cost-effective, quick, and puts them in control. Keys to cooking healthy recipes will include looking at your recipe, assessing your equipment needs, planning ahead, making it  simple, choosing cost-effective seasonal ingredients, and limiting the use of added fats, salts, and sugars.  Cooking - Breakfast and Snacks  Clinical staff conducted group or individual video education with verbal and written material and guidebook.  Patient learns how important breakfast is to satiety and nutrition through the entire day. Recommendations include key foods to eat during breakfast to help stabilize blood sugar levels and to prevent overeating at meals later in the day. Planning ahead is also a key component.  Cooking - Educational psychologist conducted group or individual video education with verbal and written material and guidebook.  Patient learns eating strategies to improve overall health, including an approach to cook more at home. Recommendations include thinking of animal protein as a side on your plate rather than center stage and focusing instead on lower calorie dense options like vegetables, fruits, whole grains, and plant-based proteins, such as beans. Making sauces in large quantities to freeze for later and leaving the skin on your vegetables are also recommended to maximize your experience.  Cooking - Healthy Salads and Dressing Clinical staff conducted group or individual video education with verbal and written material and guidebook.  Patient learns that vegetables, fruits, whole grains, and legumes are the foundations of the Pritikin Eating Plan. Recommendations include how to incorporate each of these in flavorful and healthy salads, and how to create homemade salad dressings. Proper handling of ingredients is also covered. Cooking - Soups and [employer] - Soups and Desserts Clinical staff conducted group or individual video education with verbal and written material and guidebook.  Patient learns that Pritikin soups and desserts make for easy, nutritious, and delicious snacks and meal components that are low in sodium, fat, sugar, and calorie  density, while high in vitamins, minerals, and filling fiber. Recommendations include simple and healthy ideas for soups and desserts.  Overview     The Pritikin Solution Program Overview Clinical staff conducted group or individual video education with verbal and written material and guidebook.  Patient learns that the results of the Pritikin Program have been documented in more than 100 articles published in peer-reviewed journals, and the benefits include reducing risk factors for (and, in some cases, even reversing) high cholesterol, high blood pressure, type 2 diabetes, obesity, and more! An overview of the three key pillars of the Pritikin Program will be covered: eating well, doing regular exercise, and having a healthy mind-set.  WORKSHOPS  Exercise: Exercise Basics: Building Your Action Plan Clinical staff led group instruction and group discussion with PowerPoint presentation and patient guidebook. To enhance the learning environment the use of posters, models and videos may be added. At the conclusion of this workshop, patients will comprehend the difference between physical activity and exercise, as well as the benefits of incorporating both, into their routine. Patients will understand the FITT (Frequency, Intensity, Time, and Type) principle and how to use it to build an exercise action plan. In addition, safety concerns and other considerations for exercise and cardiac rehab will be addressed by the presenter. The purpose of this lesson is to promote a comprehensive and effective weekly exercise routine in order to improve patients' overall level of fitness.   Managing Heart Disease: Your Path to a Healthier Heart Clinical staff led group instruction and group discussion with PowerPoint presentation and patient guidebook. To enhance the learning environment the use of posters, models and videos may be added.At the conclusion of this workshop, patients will understand the  anatomy and physiology of the heart. Additionally, they will understand how Pritikin's three pillars impact the risk factors, the progression, and the management of heart disease.  The purpose of this lesson is to provide a high-level overview of the heart, heart disease, and how the Pritikin lifestyle positively impacts risk factors.  Exercise Biomechanics Clinical staff led group instruction and group discussion with PowerPoint presentation and patient guidebook. To enhance the learning environment the use of posters, models and videos may be added. Patients will learn how the structural parts of their bodies function and how these functions impact their daily activities, movement, and exercise. Patients will learn how to promote a neutral spine, learn how to manage pain, and identify ways to improve their physical movement in order to promote healthy living. The purpose of this lesson is to expose patients to common physical limitations that impact physical activity. Participants will learn practical ways to adapt and manage aches and pains, and to minimize their effect on regular exercise. Patients will learn how to maintain good posture while sitting, walking, and lifting.  Balance Training and Fall Prevention  Clinical staff led group instruction and group discussion with PowerPoint presentation and patient guidebook. To enhance the learning environment the use of posters, models and videos may be added. At the conclusion of this workshop, patients will understand the importance of their sensorimotor skills (vision, proprioception, and the vestibular system) in maintaining their ability to balance as they age. Patients will apply a variety of balancing exercises that are appropriate for their current level of function. Patients will understand the common causes for poor balance, possible solutions to these problems, and ways to modify their physical environment in order to minimize their  fall risk. The purpose of this lesson is to teach patients about the importance of maintaining balance as they age and ways to minimize their risk of falling.  WORKSHOPS  Nutrition:  Fueling a Ship broker led group instruction and group discussion with PowerPoint presentation and patient guidebook. To enhance the learning environment the use of posters, models and videos may be added. Patients will review the foundational principles of the Pritikin Eating Plan and understand what constitutes a serving size in each of the food groups. Patients will also learn Pritikin-friendly foods that are better choices when away from home and review make-ahead meal and snack options. Calorie density will be reviewed and applied to three nutrition priorities: weight maintenance, weight loss, and weight gain. The purpose of this lesson is to reinforce (in a group setting) the key concepts around what patients are recommended to eat and how to apply these guidelines when away from home by planning and selecting Pritikin-friendly options. Patients will understand how calorie density may be adjusted for different weight management goals.  Mindful Eating  Clinical staff led group instruction and group discussion with PowerPoint presentation and patient guidebook. To enhance the learning environment the use of posters, models and videos may be added. Patients will briefly review the concepts of the Pritikin Eating Plan and the importance of low-calorie dense foods. The concept of mindful eating will be introduced as well as the importance of paying attention to internal hunger signals. Triggers for non-hunger eating and techniques for dealing with triggers will be explored. The purpose of this lesson is to provide patients with the opportunity to review the basic principles of the Pritikin Eating Plan, discuss the value of eating mindfully and how to measure internal cues of hunger and fullness using the  Hunger Scale. Patients will also discuss reasons for non-hunger eating and learn strategies to use for controlling emotional eating.  Targeting Your Nutrition Priorities Clinical staff led group instruction and group discussion with PowerPoint presentation and patient guidebook. To enhance the learning environment the use of posters, models and videos may be added. Patients will learn how to determine their genetic susceptibility to disease by reviewing their family history. Patients will gain insight into the importance of diet as part of an overall healthy lifestyle in mitigating the impact of genetics and other environmental insults. The purpose of this lesson is to provide patients with the opportunity to assess their personal nutrition priorities by looking at their family history, their own health history and current risk factors. Patients will also be able to discuss ways of prioritizing and modifying the Pritikin Eating Plan for their highest risk areas  Menu  Clinical staff led group instruction and group discussion with PowerPoint presentation and patient guidebook. To enhance the learning environment the use of posters, models and videos may be added. Using menus brought in from [employer], or printed from [employer], patients will apply the Pritikin dining out guidelines that were presented in the [employer] video. Patients will also be able to practice these guidelines in a variety of provided scenarios. The purpose of this lesson is to provide patients with the opportunity to practice hands-on learning of the Pritikin Dining Out guidelines with actual menus and practice scenarios.  Label Reading Clinical staff led group instruction and group discussion with PowerPoint presentation and patient guidebook. To enhance the learning environment the use of posters, models and videos may be added. Patients will review and discuss the Pritikin label reading guidelines  presented in Pritikin's Label Reading Educational series video. Using fool labels brought in from local grocery stores and markets, patients will apply the label reading guidelines and determine if  the packaged food meet the Pritikin guidelines. The purpose of this lesson is to provide patients with the opportunity to review, discuss, and practice hands-on learning of the Pritikin Label Reading guidelines with actual packaged food labels. Cooking School  Pritikin's [employer] are designed to teach patients ways to prepare quick, simple, and affordable recipes at home. The importance of nutrition's role in chronic disease risk reduction is reflected in its emphasis in the overall Pritikin program. By learning how to prepare essential core Pritikin Eating Plan recipes, patients will increase control over what they eat; be able to customize the flavor of foods without the use of added salt, sugar, or fat; and improve the quality of the food they consume. By learning a set of core recipes which are easily assembled, quickly prepared, and affordable, patients are more likely to prepare more healthy foods at home. These workshops focus on convenient breakfasts, simple entres, side dishes, and desserts which can be prepared with minimal effort and are consistent with nutrition recommendations for cardiovascular risk reduction. Cooking [employer] are taught by a Armed forces logistics/support/administrative officer (RD) who has been trained by the [employer]. The chef or RD has a clear understanding of the importance of minimizing - if not completely eliminating - added fat, sugar, and sodium in recipes. Throughout the series of Cooking School Workshop sessions, patients will learn about healthy ingredients and efficient methods of cooking to build confidence in their capability to prepare    Cooking School weekly topics:  Adding Flavor- Sodium-Free  Fast and Healthy Breakfasts  Powerhouse Plant-Based  Proteins  Satisfying Salads and Dressings  Simple Sides and Sauces  International Cuisine-Spotlight on the [employer] Zones  Delicious Desserts  Savory Soups  [employer] - Meals in a Astronomer Appetizers and Snacks  Comforting Weekend Breakfasts  One-Pot Wonders   Fast Evening Meals  Landscape architect Your Pritikin Plate  WORKSHOPS   Healthy Mindset (Psychosocial):  Focused Goals, Sustainable Changes Clinical staff led group instruction and group discussion with PowerPoint presentation and patient guidebook. To enhance the learning environment the use of posters, models and videos may be added. Patients will be able to apply effective goal setting strategies to establish at least one personal goal, and then take consistent, meaningful action toward that goal. They will learn to identify common barriers to achieving personal goals and develop strategies to overcome them. Patients will also gain an understanding of how our mind-set can impact our ability to achieve goals and the importance of cultivating a positive and growth-oriented mind-set. The purpose of this lesson is to provide patients with a deeper understanding of how to set and achieve personal goals, as well as the tools and strategies needed to overcome common obstacles which may arise along the way.  From Head to Heart: The Power of a Healthy Outlook  Clinical staff led group instruction and group discussion with PowerPoint presentation and patient guidebook. To enhance the learning environment the use of posters, models and videos may be added. Patients will be able to recognize and describe the impact of emotions and mood on physical health. They will discover the importance of self-care and explore self-care practices which may work for them. Patients will also learn how to utilize the 4 C's to cultivate a healthier outlook and better manage stress and challenges. The purpose of this lesson is to demonstrate  to patients how a healthy outlook is an essential part of maintaining good health, especially  as they continue their cardiac rehab journey.  Healthy Sleep for a Healthy Heart Clinical staff led group instruction and group discussion with PowerPoint presentation and patient guidebook. To enhance the learning environment the use of posters, models and videos may be added. At the conclusion of this workshop, patients will be able to demonstrate knowledge of the importance of sleep to overall health, well-being, and quality of life. They will understand the symptoms of, and treatments for, common sleep disorders. Patients will also be able to identify daytime and nighttime behaviors which impact sleep, and they will be able to apply these tools to help manage sleep-related challenges. The purpose of this lesson is to provide patients with a general overview of sleep and outline the importance of quality sleep. Patients will learn about a few of the most common sleep disorders. Patients will also be introduced to the concept of "sleep hygiene," and discover ways to self-manage certain sleeping problems through simple daily behavior changes. Finally, the workshop will motivate patients by clarifying the links between quality sleep and their goals of heart-healthy living.   Recognizing and Reducing Stress Clinical staff led group instruction and group discussion with PowerPoint presentation and patient guidebook. To enhance the learning environment the use of posters, models and videos may be added. At the conclusion of this workshop, patients will be able to understand the types of stress reactions, differentiate between acute and chronic stress, and recognize the impact that chronic stress has on their health. They will also be able to apply different coping mechanisms, such as reframing negative self-talk. Patients will have the opportunity to practice a variety of stress management techniques, such as deep  abdominal breathing, progressive muscle relaxation, and/or guided imagery.  The purpose of this lesson is to educate patients on the role of stress in their lives and to provide healthy techniques for coping with it.  Learning Barriers/Preferences:  Learning Barriers/Preferences - 01/22/24 1411       Learning Barriers/Preferences   Learning Barriers Sight   reading glasses   Learning Preferences Audio;Computer/Internet;Group Instruction;Individual Instruction;Skilled Demonstration;Verbal Instruction;Video;Written Material;Pictoral             Education Topics:  Knowledge Questionnaire Score:  Knowledge Questionnaire Score - 01/22/24 1542       Knowledge Questionnaire Score   Pre Score 22/24             Core Components/Risk Factors/Patient Goals at Admission:  Personal Goals and Risk Factors at Admission - 01/22/24 1411       Core Components/Risk Factors/Patient Goals on Admission    Weight Management Yes;Weight Loss;Obesity    Intervention Weight Management: Develop a combined nutrition and exercise program designed to reach desired caloric intake, while maintaining appropriate intake of nutrient and fiber, sodium and fats, and appropriate energy expenditure required for the weight goal.;Weight Management: Provide education and appropriate resources to help participant work on and attain dietary goals.;Weight Management/Obesity: Establish reasonable short term and long term weight goals.;Obesity: Provide education and appropriate resources to help participant work on and attain dietary goals.    Admit Weight 202 lb 2.6 oz (91.7 kg)    Goal Weight: Long Term 200 lb (90.7 kg)   pt goal   Expected Outcomes Short Term: Continue to assess and modify interventions until short term weight is achieved;Weight Loss: Understanding of general recommendations for a balanced deficit meal plan, which promotes 1-2 lb weight loss per week and includes a negative energy balance of [phone]  kcal/d;Understanding recommendations for meals to  include 15-35% energy as protein, 25-35% energy from fat, 35-60% energy from carbohydrates, less than 200mg  of dietary cholesterol, 20-35 gm of total fiber daily;Understanding of distribution of calorie intake throughout the day with the consumption of 4-5 meals/snacks;Long Term: Adherence to nutrition and physical activity/exercise program aimed toward attainment of established weight goal    Hypertension Yes    Intervention Provide education on lifestyle modifcations including regular physical activity/exercise, weight management, moderate sodium restriction and increased consumption of fresh fruit, vegetables, and low fat dairy, alcohol moderation, and smoking cessation.;Monitor prescription use compliance.    Expected Outcomes Long Term: Maintenance of blood pressure at goal levels.;Short Term: Continued assessment and intervention until BP is < 140/46mm HG in hypertensive participants. < 130/68mm HG in hypertensive participants with diabetes, heart failure or chronic kidney disease.    Lipids Yes    Intervention Provide education and support for participant on nutrition & aerobic/resistive exercise along with prescribed medications to achieve LDL 70mg , HDL >40mg .    Expected Outcomes Short Term: Participant states understanding of desired cholesterol values and is compliant with medications prescribed. Participant is following exercise prescription and nutrition guidelines.;Long Term: Cholesterol controlled with medications as prescribed, with individualized exercise RX and with personalized nutrition plan. Value goals: LDL < 70mg , HDL > 40 mg.             Core Components/Risk Factors/Patient Goals Review:    Core Components/Risk Factors/Patient Goals at Discharge (Final Review):    ITP Comments:  ITP Comments     Row Name 01/22/24 1406           ITP Comments Dr. Armanda Magic medical director. Introduction to pritikin  education/intensive cardiac rehab. Initial orientation packet reviewed with patient.                Comments: Participant attended orientation for the cardiac rehabilitation program on  01/22/2024  to perform initial intake and exercise walk test. Patient introduced to the [facility] education and orientation packet was reviewed. Completed 6-minute walk test, measurements, initial ITP, and exercise prescription. Vital signs stable. Telemetry-normal sinus rhythm with PVCs, asymptomatic.   Service time was from 1330 to 1512.  Jonna Coup, MS, ACSM-CEP 01/22/2024 3:43 PM

## 2024-01-22 NOTE — Progress Notes (Deleted)
 Cardiac Individual Treatment Plan  Patient Details  Name: Terry Vega MRN: [id number] Date of Birth: [birth year] Referring Provider:   Flowsheet Row INTENSIVE CARDIAC REHAB ORIENT from 01/22/2024 in [facility]  Referring Provider Lorine Bears, MD       Initial Encounter Date:  Flowsheet Row INTENSIVE CARDIAC REHAB ORIENT from 01/22/2024 in [facility]  Date 01/22/24       Visit Diagnosis: 12/20/23 CABG x 5  Patient's Home Medications on Admission:  Current Outpatient Medications:    aspirin EC 325 MG tablet, Take 1 tablet (325 mg total) by mouth daily., Disp: , Rfl:    atorvastatin (LIPITOR) 40 MG tablet, Take 1 tablet (40 mg total) by mouth daily., Disp: 30 tablet, Rfl: 6   carvedilol (COREG) 6.25 MG tablet, Take 1 tablet (6.25 mg total) by mouth 2 (two) times daily., Disp: 180 tablet, Rfl: 3   cholecalciferol (VITAMIN D) 1000 UNITS tablet, Take 1,000 Units by mouth daily., Disp: , Rfl:    levothyroxine (SYNTHROID) 100 MCG tablet, Take 1 tablet (100 mcg total) by mouth daily., Disp: 90 tablet, Rfl: 3   losartan-hydrochlorothiazide (HYZAAR) 100-25 MG tablet, Take 1 tablet by mouth daily., Disp: , Rfl:    Multiple Vitamin (MULTIVITAMIN) tablet, Take 1 tablet by mouth daily., Disp: , Rfl:    PREVIDENT 5000 BOOSTER PLUS 1.1 % PSTE, Place 1 application  onto teeth at bedtime., Disp: , Rfl:    triamcinolone cream (KENALOG) 0.1 %, Apply 1 Application topically daily as needed (irritation)., Disp: , Rfl:    vitamin E 1000 UNIT capsule, Take 1,000 Units by mouth daily., Disp: , Rfl:    traMADol (ULTRAM) 50 MG tablet, Take 1 tablet (50 mg total) by mouth every 6 (six) hours as needed for moderate pain (pain score 4-6)., Disp: 28 tablet, Rfl: 0  Past Medical History: Past Medical History:  Diagnosis Date   Allergy    Arthritis    [employer] 07/21/2020   CAD (coronary  artery disease)    a. 11/2023 Cath: LM 35m/d, LAD 70p/m, D1 99, D2 80, LCX 70ost, 21m/d, OM2 90, RCA 40p/m, 90d, EF 55-65%; b. 12/2023 CABG x 5: LIMA->LAD, VG->D1, VG->D2, VG->OM, VG->dRCA.   Diastolic dysfunction    a. 07/2021 Echo: EF 60-65%.  Mild LVH.  Grade 1 DD.  Normal RV fxn. mildly dil LA. Triv MR/AI. Mild AoV sclerosis.   Heart murmur    Hypertension    Hypothyroidism    PVC's (premature ventricular contractions)     Tobacco Use: Social History   Tobacco Use  Smoking Status Never  Smokeless Tobacco Never    Labs: Review Flowsheet  More data exists      Latest Ref Rng & Units 07/28/2021 08/01/2022 08/04/2023 12/18/2023 12/19/2023  Labs for ITP Cardiac and Pulmonary Rehab  Cholestrol 0 - 200 mg/dL 119  147  829  - -  LDL (calc) 0 - 99 mg/dL 562  130  91  - -  HDL-C >39.00 mg/dL 86.57  84.69  62.95  - -  Trlycerides 0.0 - 149.0 mg/dL 28.4  132.4  40.1  - -  Hemoglobin A1c 4.8 - 5.6 % 5.3  - - 5.2  -  PH, Arterial 7.35 - 7.45 - - - 7.45  7.340  7.336  7.406  7.431  7.408  7.402  7.358  7.382   PCO2 arterial 32 - 48 mmHg - - -  43  44.8  45.6  38.2  37.8  40.3  42.0  48.2  43.9   Bicarbonate 20.0 - 28.0 mmol/L - - - 29.9  24.0  24.2  24.3  25.1  25.5  26.1  26.2  27.1  26.1   TCO2 22 - 32 mmol/L - - - - 25  26  26  26  27  27  25  27  27  28  29  26  27  29    Acid-base deficit 0.0 - 2.0 mmol/L - - - - 2.0  2.0  1.0   O2 Saturation % - - - 97.2  98  98  93  100  100  100  84  100  100     Details       Multiple values from one day are sorted in reverse-chronological order         Capillary Blood Glucose: Lab Results  Component Value Date   GLUCAP 141 (H) 12/21/2023   GLUCAP 96 12/21/2023   GLUCAP 115 (H) 12/20/2023   GLUCAP 142 (H) 12/20/2023   GLUCAP 115 (H) 12/20/2023     Exercise Target Goals: Exercise Program Goal: Individual exercise prescription set using results from initial 6 min walk test and THRR while considering  patient's activity barriers and safety.    Exercise Prescription Goal: Initial exercise prescription builds to 30-45 minutes a day of aerobic activity, 2-3 days per week.  Home exercise guidelines will be given to patient during program as part of exercise prescription that the participant will acknowledge.  Activity Barriers & Risk Stratification:  Activity Barriers & Cardiac Risk Stratification - 01/22/24 1409       Activity Barriers & Cardiac Risk Stratification   Activity Barriers Incisional Pain;Balance Concerns;Joint Problems    Cardiac Risk Stratification High   <5 METs on            6 Minute Walk:  6 Minute Walk     Row Name 01/22/24 1539         6 Minute Walk   Phase Initial     Distance 1420 feet     Walk Time 6 minutes     # of Rest Breaks 0     MPH 2.69     METS 2.6     RPE 11     Perceived Dyspnea  0     VO2 Peak 9.09     Symptoms No     Resting HR 75 bpm     Resting BP 106/62     Resting Oxygen Saturation  95 %     Exercise Oxygen Saturation  during 6 min walk 93 %     Max Ex. HR 108 bpm     Max Ex. BP 142/64     2 Minute Post BP 112/68              Oxygen Initial Assessment:   Oxygen Re-Evaluation:   Oxygen Discharge (Final Oxygen Re-Evaluation):   Initial Exercise Prescription:  Initial Exercise Prescription - 01/22/24 1500       Date of Initial Exercise RX and Referring Provider   Date 01/22/24    Referring Provider Lorine Bears, MD    Expected Discharge Date 04/17/24      Recumbant Bike   Level 1    RPM 50    Watts 30    Minutes 15    METs 2      NuStep   Level  2    SPM 75    Minutes 15    METs 2.3      Prescription Details   Frequency (times per week) 3    Duration Progress to 30 minutes of continuous aerobic without signs/symptoms of physical distress      Intensity   THRR 40-80% of Max Heartrate 56-113    Ratings of Perceived Exertion 11-13    Perceived Dyspnea 0-4      Progression   Progression Continue progressive overload as per policy  without signs/symptoms or physical distress.      Resistance Training   Training Prescription Yes    Weight 3    Reps 10-15             Perform Capillary Blood Glucose checks as needed.  Exercise Prescription Changes:   Exercise Comments:   Exercise Goals and Review:   Exercise Goals     Row Name 01/22/24 1409             Exercise Goals   Increase Physical Activity Yes       Intervention Provide advice, education, support and counseling about physical activity/exercise needs.;Develop an individualized exercise prescription for aerobic and resistive training based on initial evaluation findings, risk stratification, comorbidities and participant's personal goals.       Expected Outcomes Short Term: Attend rehab on a regular basis to increase amount of physical activity.;Long Term: Exercising regularly at least 3-5 days a week.;Long Term: Add in home exercise to make exercise part of routine and to increase amount of physical activity.       Increase Strength and Stamina Yes       Intervention Provide advice, education, support and counseling about physical activity/exercise needs.;Develop an individualized exercise prescription for aerobic and resistive training based on initial evaluation findings, risk stratification, comorbidities and participant's personal goals.       Expected Outcomes Short Term: Increase workloads from initial exercise prescription for resistance, speed, and METs.;Short Term: Perform resistance training exercises routinely during rehab and add in resistance training at home;Long Term: Improve cardiorespiratory fitness, muscular endurance and strength as measured by increased METs and functional capacity ( )       Able to understand and use rate of perceived exertion (RPE) scale Yes       Intervention Provide education and explanation on how to use RPE scale       Expected Outcomes Short Term: Able to use RPE daily in rehab to express subjective  intensity level;Long Term:  Able to use RPE to guide intensity level when exercising independently       Knowledge and understanding of Target Heart Rate Range (THRR) Yes       Intervention Provide education and explanation of THRR including how the numbers were predicted and where they are located for reference       Expected Outcomes Short Term: Able to state/look up THRR;Short Term: Able to use daily as guideline for intensity in rehab;Long Term: Able to use THRR to govern intensity when exercising independently       Understanding of Exercise Prescription Yes       Intervention Provide education, explanation, and written materials on patient's individual exercise prescription       Expected Outcomes Short Term: Able to explain program exercise prescription;Long Term: Able to explain home exercise prescription to exercise independently                Exercise Goals Re-Evaluation :   Discharge Exercise Prescription (  Final Exercise Prescription Changes):   Nutrition:  Target Goals: Understanding of nutrition guidelines, daily intake of sodium 1500mg , cholesterol 200mg , calories 30% from fat and 7% or less from saturated fats, daily to have 5 or more servings of fruits and vegetables.  Biometrics:  Pre Biometrics - 01/22/24 1407       Pre Biometrics   Waist Circumference 42.5 inches    Hip Circumference 43.5 inches    Waist to Hip Ratio 0.98 %    Triceps Skinfold 23 mm    % Body Fat 32.4 %    Grip Strength 36 kg    Flexibility 11 in    Single Leg Stand 1.25 seconds              Nutrition Therapy Plan and Nutrition Goals:   Nutrition Assessments:  MEDIFICTS Score Key: >=70 Need to make dietary changes  40-70 Heart Healthy Diet <= 40 Therapeutic Level Cholesterol Diet    Picture Your Plate Scores: <14 Unhealthy dietary pattern with much room for improvement. 41-50 Dietary pattern unlikely to meet recommendations for good health and room for improvement. 51-60  More healthful dietary pattern, with some room for improvement.  >60 Healthy dietary pattern, although there may be some specific behaviors that could be improved.    Nutrition Goals Re-Evaluation:   Nutrition Goals Re-Evaluation:   Nutrition Goals Discharge (Final Nutrition Goals Re-Evaluation):   Psychosocial: Target Goals: Acknowledge presence or absence of significant depression and/or stress, maximize coping skills, provide positive support system. Participant is able to verbalize types and ability to use techniques and skills needed for reducing stress and depression.  Initial Review & Psychosocial Screening:  Initial Psych Review & Screening - 01/22/24 1409       Initial Review   Current issues with None Identified      Family Dynamics   Good Support System? Yes   Wife for support   Comments Terry Vega denies any feelings of anxiety/stress/depression. He did share that he is waking up every 2 hours at night to use the restroom, he said it has become less frequent the past few weeks and he has no trouble falling back asleep. Denies any need for additional resources at this time.      Barriers   Psychosocial barriers to participate in program There are no identifiable barriers or psychosocial needs.      Screening Interventions   Interventions Encouraged to exercise;Provide feedback about the scores to participant    Expected Outcomes Short Term goal: Identification and review with participant of any Quality of Life or Depression concerns found by scoring the questionnaire.;Long Term goal: The participant improves quality of Life and PHQ9 Scores as seen by post scores and/or verbalization of changes             Quality of Life Scores:  Quality of Life - 01/22/24 1542       Quality of Life   Select Quality of Life      Quality of Life Scores   Health/Function Pre 26.2 %    Socioeconomic Pre 28.21 %    Psych/Spiritual Pre 27.21 %    Family Pre 28.8 %    GLOBAL Pre  27.21 %            Scores of 19 and below usually indicate a poorer quality of life in these areas.  A difference of  2-3 points is a clinically meaningful difference.  A difference of 2-3 points in the total score of the Quality  of Life Index has been associated with significant improvement in overall quality of life, self-image, physical symptoms, and general health in studies assessing change in quality of life.  PHQ-9: Review Flowsheet  More data exists      01/22/2024 08/04/2023 05/31/2023 09/01/2022 07/28/2021  Depression screen PHQ 2/9  Decreased Interest 0 0 0 0 0  Down, Depressed, Hopeless 0 0 0 0 0  PHQ - 2 Score 0 0 0 0 0  Altered sleeping 1 0 0 - 0  Tired, decreased energy 1 1 0 - 0  Change in appetite 1 0 0 - 0  Feeling bad or failure about yourself  0 0 0 - 0  Trouble concentrating 0 0 0 - 0  Moving slowly or fidgety/restless 0 0 0 - 0  Suicidal thoughts 0 0 0 - 0  PHQ-9 Score 3 1 0 - 0  Difficult doing work/chores Not difficult at all Not difficult at all Not difficult at all - -   Interpretation of Total Score  Total Score Depression Severity:  1-4 = Minimal depression, 5-9 = Mild depression, 10-14 = Moderate depression, 15-19 = Moderately severe depression, 20-27 = Severe depression   Psychosocial Evaluation and Intervention:   Psychosocial Re-Evaluation:   Psychosocial Discharge (Final Psychosocial Re-Evaluation):   Vocational Rehabilitation: Provide vocational rehab assistance to qualifying candidates.   Vocational Rehab Evaluation & Intervention:  Vocational Rehab - 01/22/24 1542       Initial Vocational Rehab Evaluation & Intervention   Assessment shows need for Vocational Rehabilitation No   Terry Vega is retired            Education: Education Goals: Education classes will be provided on a weekly basis, covering required topics. Participant will state understanding/return demonstration of topics presented.     Core Videos: Exercise    Move  It!  Clinical staff conducted group or individual video education with verbal and written material and guidebook.  Patient learns the recommended Pritikin exercise program. Exercise with the goal of living a long, healthy life. Some of the health benefits of exercise include controlled diabetes, healthier blood pressure levels, improved cholesterol levels, improved heart and lung capacity, improved sleep, and better body composition. Everyone should speak with their doctor before starting or changing an exercise routine.  Biomechanical Limitations Clinical staff conducted group or individual video education with verbal and written material and guidebook.  Patient learns how biomechanical limitations can impact exercise and how we can mitigate and possibly overcome limitations to have an impactful and balanced exercise routine.  Body Composition Clinical staff conducted group or individual video education with verbal and written material and guidebook.  Patient learns that body composition (ratio of muscle mass to fat mass) is a key component to assessing overall fitness, rather than body weight alone. Increased fat mass, especially visceral belly fat, can put [place] at increased risk for metabolic syndrome, type 2 diabetes, heart disease, and even death. It is recommended to combine diet and exercise (cardiovascular and resistance training) to improve your body composition. Seek guidance from your physician and exercise physiologist before implementing an exercise routine.  Exercise Action Plan Clinical staff conducted group or individual video education with verbal and written material and guidebook.  Patient learns the recommended strategies to achieve and enjoy long-term exercise adherence, including variety, self-motivation, self-efficacy, and positive decision making. Benefits of exercise include fitness, good health, weight management, more energy, better sleep, less stress, and overall  well-being.  Medical   Heart Disease Risk Reduction  Clinical staff conducted group or individual video education with verbal and written material and guidebook.  Patient learns our heart is our most vital organ as it circulates oxygen, nutrients, white blood cells, and hormones throughout the entire body, and carries waste away. Data supports a plant-based eating plan like the Pritikin Program for its effectiveness in slowing progression of and reversing heart disease. The video provides a number of recommendations to address heart disease.   Metabolic Syndrome and Belly Fat  Clinical staff conducted group or individual video education with verbal and written material and guidebook.  Patient learns what metabolic syndrome is, how it leads to heart disease, and how one can reverse it and keep it from coming back. You have metabolic syndrome if you have 3 of the following 5 criteria: abdominal obesity, high blood pressure, high triglycerides, low HDL cholesterol, and high blood sugar.  Hypertension and Heart Disease Clinical staff conducted group or individual video education with verbal and written material and guidebook.  Patient learns that high blood pressure, or hypertension, is very common in the [place]. Hypertension is largely due to excessive salt intake, but other important risk factors include being overweight, physical inactivity, drinking too much alcohol, smoking, and not eating enough potassium from fruits and vegetables. High blood pressure is a leading risk factor for heart attack, stroke, congestive heart failure, dementia, kidney failure, and premature death. Long-term effects of excessive salt intake include stiffening of the arteries and thickening of heart muscle and organ damage. Recommendations include ways to reduce hypertension and the risk of heart disease.  Diseases of Our Time - Focusing on Diabetes Clinical staff conducted group or individual video education with  verbal and written material and guidebook.  Patient learns why the best way to stop diseases of our time is prevention, through food and other lifestyle changes. Medicine (such as prescription pills and surgeries) is often only a Band-Aid on the problem, not a long-term solution. Most common diseases of our time include obesity, type 2 diabetes, hypertension, heart disease, and cancer. The Pritikin Program is recommended and has been proven to help reduce, reverse, and/or prevent the damaging effects of metabolic syndrome.  Nutrition   Overview of the Pritikin Eating Plan  Clinical staff conducted group or individual video education with verbal and written material and guidebook.  Patient learns about the Pritikin Eating Plan for disease risk reduction. The Pritikin Eating Plan emphasizes a wide variety of unrefined, minimally-processed carbohydrates, like fruits, vegetables, whole grains, and legumes. Go, Caution, and Stop food choices are explained. Plant-based and lean animal proteins are emphasized. Rationale provided for low sodium intake for blood pressure control, low added sugars for blood sugar stabilization, and low added fats and oils for coronary artery disease risk reduction and weight management.  Calorie Density  Clinical staff conducted group or individual video education with verbal and written material and guidebook.  Patient learns about calorie density and how it impacts the Pritikin Eating Plan. Knowing the characteristics of the food you choose will help you decide whether those foods will lead to weight gain or weight loss, and whether you want to consume more or less of them. Weight loss is usually a side effect of the Pritikin Eating Plan because of its focus on low calorie-dense foods.  Label Reading  Clinical staff conducted group or individual video education with verbal and written material and guidebook.  Patient learns about the Pritikin recommended label reading  guidelines and corresponding recommendations regarding calorie density, added  sugars, sodium content, and whole grains.  Dining Out - Part 1  Clinical staff conducted group or individual video education with verbal and written material and guidebook.  Patient learns that restaurant meals can be sabotaging because they can be so high in calories, fat, sodium, and/or sugar. Patient learns recommended strategies on how to positively address this and avoid unhealthy pitfalls.  Facts on Fats  Clinical staff conducted group or individual video education with verbal and written material and guidebook.  Patient learns that lifestyle modifications can be just as effective, if not more so, as many medications for lowering your risk of heart disease. A Pritikin lifestyle can help to reduce your risk of inflammation and atherosclerosis (cholesterol build-up, or plaque, in the artery walls). Lifestyle interventions such as dietary choices and physical activity address the cause of atherosclerosis. A review of the types of fats and their impact on blood cholesterol levels, along with dietary recommendations to reduce fat intake is also included.  Nutrition Action Plan  Clinical staff conducted group or individual video education with verbal and written material and guidebook.  Patient learns how to incorporate Pritikin recommendations into their lifestyle. Recommendations include planning and keeping personal health goals in mind as an important part of their success.  Healthy Mind-Set    Healthy Minds, Bodies, Hearts  Clinical staff conducted group or individual video education with verbal and written material and guidebook.  Patient learns how to identify when they are stressed. Video will discuss the impact of that stress, as well as the many benefits of stress management. Patient will also be introduced to stress management techniques. The way we think, act, and feel has an impact on our hearts.  How Our  Thoughts Can Heal Our Hearts  Clinical staff conducted group or individual video education with verbal and written material and guidebook.  Patient learns that negative thoughts can cause depression and anxiety. This can result in negative lifestyle behavior and serious health problems. Cognitive behavioral therapy is an effective method to help control our thoughts in order to change and improve our emotional outlook.  Additional Videos:  Exercise    Improving Performance  Clinical staff conducted group or individual video education with verbal and written material and guidebook.  Patient learns to use a non-linear approach by alternating intensity levels and lengths of time spent exercising to help burn more calories and lose more body fat. Cardiovascular exercise helps improve heart health, metabolism, hormonal balance, blood sugar control, and recovery from fatigue. Resistance training improves strength, endurance, balance, coordination, reaction time, metabolism, and muscle mass. Flexibility exercise improves circulation, posture, and balance. Seek guidance from your physician and exercise physiologist before implementing an exercise routine and learn your capabilities and proper form for all exercise.  Introduction to Yoga  Clinical staff conducted group or individual video education with verbal and written material and guidebook.  Patient learns about yoga, a discipline of the coming together of mind, breath, and body. The benefits of yoga include improved flexibility, improved range of motion, better posture and core strength, increased lung function, weight loss, and positive self-image. Yoga's heart health benefits include lowered blood pressure, healthier heart rate, decreased cholesterol and triglyceride levels, improved immune function, and reduced stress. Seek guidance from your physician and exercise physiologist before implementing an exercise routine and learn your capabilities and  proper form for all exercise.  Medical   Aging: Enhancing Your Quality of Life  Clinical staff conducted group or individual video education with verbal and  written material and guidebook.  Patient learns key strategies and recommendations to stay in good physical health and enhance quality of life, such as prevention strategies, having an advocate, securing a Health Care Proxy and Power of Attorney, and keeping a list of medications and system for tracking them. It also discusses how to avoid risk for bone loss.  Biology of Weight Control  Clinical staff conducted group or individual video education with verbal and written material and guidebook.  Patient learns that weight gain occurs because we consume more calories than we burn (eating more, moving less). Even if your body weight is normal, you may have higher ratios of fat compared to muscle mass. Too much body fat puts you at increased risk for cardiovascular disease, heart attack, stroke, type 2 diabetes, and obesity-related cancers. In addition to exercise, following the Pritikin Eating Plan can help reduce your risk.  Decoding Lab Results  Clinical staff conducted group or individual video education with verbal and written material and guidebook.  Patient learns that lab test reflects one measurement whose values change over time and are influenced by many factors, including medication, stress, sleep, exercise, food, hydration, pre-existing medical conditions, and more. It is recommended to use the knowledge from this video to become more involved with your lab results and evaluate your numbers to speak with your doctor.   Diseases of Our Time - Overview  Clinical staff conducted group or individual video education with verbal and written material and guidebook.  Patient learns that according to the CDC, 50% to 70% of chronic diseases (such as obesity, type 2 diabetes, elevated lipids, hypertension, and heart disease) are avoidable through  lifestyle improvements including healthier food choices, listening to satiety cues, and increased physical activity.  Sleep Disorders Clinical staff conducted group or individual video education with verbal and written material and guidebook.  Patient learns how good quality and duration of sleep are important to overall health and well-being. Patient also learns about sleep disorders and how they impact health along with recommendations to address them, including discussing with a physician.  Nutrition  Dining Out - Part 2 Clinical staff conducted group or individual video education with verbal and written material and guidebook.  Patient learns how to plan ahead and communicate in order to maximize their dining experience in a healthy and nutritious manner. Included are recommended food choices based on the type of restaurant the patient is visiting.   Fueling a Banker conducted group or individual video education with verbal and written material and guidebook.  There is a strong connection between our food choices and our health. Diseases like obesity and type 2 diabetes are very prevalent and are in large-part due to lifestyle choices. The Pritikin Eating Plan provides plenty of food and hunger-curbing satisfaction. It is easy to follow, affordable, and helps reduce health risks.  Menu Workshop  Clinical staff conducted group or individual video education with verbal and written material and guidebook.  Patient learns that restaurant meals can sabotage health goals because they are often packed with calories, fat, sodium, and sugar. Recommendations include strategies to plan ahead and to communicate with the manager, chef, or server to help order a healthier meal.  Planning Your Eating Strategy  Clinical staff conducted group or individual video education with verbal and written material and guidebook.  Patient learns about the Pritikin Eating Plan and its benefit of  reducing the risk of disease. The Pritikin Eating Plan does not focus on calories.  Instead, it emphasizes high-quality, nutrient-rich foods. By knowing the characteristics of the foods, we choose, we can determine their calorie density and make informed decisions.  Targeting Your Nutrition Priorities  Clinical staff conducted group or individual video education with verbal and written material and guidebook.  Patient learns that lifestyle habits have a tremendous impact on disease risk and progression. This video provides eating and physical activity recommendations based on your personal health goals, such as reducing LDL cholesterol, losing weight, preventing or controlling type 2 diabetes, and reducing high blood pressure.  Vitamins and Minerals  Clinical staff conducted group or individual video education with verbal and written material and guidebook.  Patient learns different ways to obtain key vitamins and minerals, including through a recommended healthy diet. It is important to discuss all supplements you take with your doctor.   Healthy Mind-Set    Smoking Cessation  Clinical staff conducted group or individual video education with verbal and written material and guidebook.  Patient learns that cigarette smoking and tobacco addiction pose a serious health risk which affects millions of people. Stopping smoking will significantly reduce the risk of heart disease, lung disease, and many forms of cancer. Recommended strategies for quitting are covered, including working with your doctor to develop a successful plan.  Culinary   Becoming a Set designer conducted group or individual video education with verbal and written material and guidebook.  Patient learns that cooking at home can be healthy, cost-effective, quick, and puts them in control. Keys to cooking healthy recipes will include looking at your recipe, assessing your equipment needs, planning ahead, making it  simple, choosing cost-effective seasonal ingredients, and limiting the use of added fats, salts, and sugars.  Cooking - Breakfast and Snacks  Clinical staff conducted group or individual video education with verbal and written material and guidebook.  Patient learns how important breakfast is to satiety and nutrition through the entire day. Recommendations include key foods to eat during breakfast to help stabilize blood sugar levels and to prevent overeating at meals later in the day. Planning ahead is also a key component.  Cooking - Educational psychologist conducted group or individual video education with verbal and written material and guidebook.  Patient learns eating strategies to improve overall health, including an approach to cook more at home. Recommendations include thinking of animal protein as a side on your plate rather than center stage and focusing instead on lower calorie dense options like vegetables, fruits, whole grains, and plant-based proteins, such as beans. Making sauces in large quantities to freeze for later and leaving the skin on your vegetables are also recommended to maximize your experience.  Cooking - Healthy Salads and Dressing Clinical staff conducted group or individual video education with verbal and written material and guidebook.  Patient learns that vegetables, fruits, whole grains, and legumes are the foundations of the Pritikin Eating Plan. Recommendations include how to incorporate each of these in flavorful and healthy salads, and how to create homemade salad dressings. Proper handling of ingredients is also covered. Cooking - Soups and [employer] - Soups and Desserts Clinical staff conducted group or individual video education with verbal and written material and guidebook.  Patient learns that Pritikin soups and desserts make for easy, nutritious, and delicious snacks and meal components that are low in sodium, fat, sugar, and calorie  density, while high in vitamins, minerals, and filling fiber. Recommendations include simple and healthy ideas for soups and desserts.  Overview     The Pritikin Solution Program Overview Clinical staff conducted group or individual video education with verbal and written material and guidebook.  Patient learns that the results of the Pritikin Program have been documented in more than 100 articles published in peer-reviewed journals, and the benefits include reducing risk factors for (and, in some cases, even reversing) high cholesterol, high blood pressure, type 2 diabetes, obesity, and more! An overview of the three key pillars of the Pritikin Program will be covered: eating well, doing regular exercise, and having a healthy mind-set.  WORKSHOPS  Exercise: Exercise Basics: Building Your Action Plan Clinical staff led group instruction and group discussion with PowerPoint presentation and patient guidebook. To enhance the learning environment the use of posters, models and videos may be added. At the conclusion of this workshop, patients will comprehend the difference between physical activity and exercise, as well as the benefits of incorporating both, into their routine. Patients will understand the FITT (Frequency, Intensity, Time, and Type) principle and how to use it to build an exercise action plan. In addition, safety concerns and other considerations for exercise and cardiac rehab will be addressed by the presenter. The purpose of this lesson is to promote a comprehensive and effective weekly exercise routine in order to improve patients' overall level of fitness.   Managing Heart Disease: Your Path to a Healthier Heart Clinical staff led group instruction and group discussion with PowerPoint presentation and patient guidebook. To enhance the learning environment the use of posters, models and videos may be added.At the conclusion of this workshop, patients will understand the  anatomy and physiology of the heart. Additionally, they will understand how Pritikin's three pillars impact the risk factors, the progression, and the management of heart disease.  The purpose of this lesson is to provide a high-level overview of the heart, heart disease, and how the Pritikin lifestyle positively impacts risk factors.  Exercise Biomechanics Clinical staff led group instruction and group discussion with PowerPoint presentation and patient guidebook. To enhance the learning environment the use of posters, models and videos may be added. Patients will learn how the structural parts of their bodies function and how these functions impact their daily activities, movement, and exercise. Patients will learn how to promote a neutral spine, learn how to manage pain, and identify ways to improve their physical movement in order to promote healthy living. The purpose of this lesson is to expose patients to common physical limitations that impact physical activity. Participants will learn practical ways to adapt and manage aches and pains, and to minimize their effect on regular exercise. Patients will learn how to maintain good posture while sitting, walking, and lifting.  Balance Training and Fall Prevention  Clinical staff led group instruction and group discussion with PowerPoint presentation and patient guidebook. To enhance the learning environment the use of posters, models and videos may be added. At the conclusion of this workshop, patients will understand the importance of their sensorimotor skills (vision, proprioception, and the vestibular system) in maintaining their ability to balance as they age. Patients will apply a variety of balancing exercises that are appropriate for their current level of function. Patients will understand the common causes for poor balance, possible solutions to these problems, and ways to modify their physical environment in order to minimize their  fall risk. The purpose of this lesson is to teach patients about the importance of maintaining balance as they age and ways to minimize their risk of falling.  WORKSHOPS  Nutrition:  Fueling a Ship broker led group instruction and group discussion with PowerPoint presentation and patient guidebook. To enhance the learning environment the use of posters, models and videos may be added. Patients will review the foundational principles of the Pritikin Eating Plan and understand what constitutes a serving size in each of the food groups. Patients will also learn Pritikin-friendly foods that are better choices when away from home and review make-ahead meal and snack options. Calorie density will be reviewed and applied to three nutrition priorities: weight maintenance, weight loss, and weight gain. The purpose of this lesson is to reinforce (in a group setting) the key concepts around what patients are recommended to eat and how to apply these guidelines when away from home by planning and selecting Pritikin-friendly options. Patients will understand how calorie density may be adjusted for different weight management goals.  Mindful Eating  Clinical staff led group instruction and group discussion with PowerPoint presentation and patient guidebook. To enhance the learning environment the use of posters, models and videos may be added. Patients will briefly review the concepts of the Pritikin Eating Plan and the importance of low-calorie dense foods. The concept of mindful eating will be introduced as well as the importance of paying attention to internal hunger signals. Triggers for non-hunger eating and techniques for dealing with triggers will be explored. The purpose of this lesson is to provide patients with the opportunity to review the basic principles of the Pritikin Eating Plan, discuss the value of eating mindfully and how to measure internal cues of hunger and fullness using the  Hunger Scale. Patients will also discuss reasons for non-hunger eating and learn strategies to use for controlling emotional eating.  Targeting Your Nutrition Priorities Clinical staff led group instruction and group discussion with PowerPoint presentation and patient guidebook. To enhance the learning environment the use of posters, models and videos may be added. Patients will learn how to determine their genetic susceptibility to disease by reviewing their family history. Patients will gain insight into the importance of diet as part of an overall healthy lifestyle in mitigating the impact of genetics and other environmental insults. The purpose of this lesson is to provide patients with the opportunity to assess their personal nutrition priorities by looking at their family history, their own health history and current risk factors. Patients will also be able to discuss ways of prioritizing and modifying the Pritikin Eating Plan for their highest risk areas  Menu  Clinical staff led group instruction and group discussion with PowerPoint presentation and patient guidebook. To enhance the learning environment the use of posters, models and videos may be added. Using menus brought in from [employer], or printed from [employer], patients will apply the Pritikin dining out guidelines that were presented in the [employer] video. Patients will also be able to practice these guidelines in a variety of provided scenarios. The purpose of this lesson is to provide patients with the opportunity to practice hands-on learning of the Pritikin Dining Out guidelines with actual menus and practice scenarios.  Label Reading Clinical staff led group instruction and group discussion with PowerPoint presentation and patient guidebook. To enhance the learning environment the use of posters, models and videos may be added. Patients will review and discuss the Pritikin label reading guidelines  presented in Pritikin's Label Reading Educational series video. Using fool labels brought in from local grocery stores and markets, patients will apply the label reading guidelines and determine if  the packaged food meet the Pritikin guidelines. The purpose of this lesson is to provide patients with the opportunity to review, discuss, and practice hands-on learning of the Pritikin Label Reading guidelines with actual packaged food labels. Cooking School  Pritikin's [employer] are designed to teach patients ways to prepare quick, simple, and affordable recipes at home. The importance of nutrition's role in chronic disease risk reduction is reflected in its emphasis in the overall Pritikin program. By learning how to prepare essential core Pritikin Eating Plan recipes, patients will increase control over what they eat; be able to customize the flavor of foods without the use of added salt, sugar, or fat; and improve the quality of the food they consume. By learning a set of core recipes which are easily assembled, quickly prepared, and affordable, patients are more likely to prepare more healthy foods at home. These workshops focus on convenient breakfasts, simple entres, side dishes, and desserts which can be prepared with minimal effort and are consistent with nutrition recommendations for cardiovascular risk reduction. Cooking [employer] are taught by a Armed forces logistics/support/administrative officer (RD) who has been trained by the [employer]. The chef or RD has a clear understanding of the importance of minimizing - if not completely eliminating - added fat, sugar, and sodium in recipes. Throughout the series of Cooking School Workshop sessions, patients will learn about healthy ingredients and efficient methods of cooking to build confidence in their capability to prepare    Cooking School weekly topics:  Adding Flavor- Sodium-Free  Fast and Healthy Breakfasts  Powerhouse Plant-Based  Proteins  Satisfying Salads and Dressings  Simple Sides and Sauces  International Cuisine-Spotlight on the [employer] Zones  Delicious Desserts  Savory Soups  [employer] - Meals in a Astronomer Appetizers and Snacks  Comforting Weekend Breakfasts  One-Pot Wonders   Fast Evening Meals  Landscape architect Your Pritikin Plate  WORKSHOPS   Healthy Mindset (Psychosocial):  Focused Goals, Sustainable Changes Clinical staff led group instruction and group discussion with PowerPoint presentation and patient guidebook. To enhance the learning environment the use of posters, models and videos may be added. Patients will be able to apply effective goal setting strategies to establish at least one personal goal, and then take consistent, meaningful action toward that goal. They will learn to identify common barriers to achieving personal goals and develop strategies to overcome them. Patients will also gain an understanding of how our mind-set can impact our ability to achieve goals and the importance of cultivating a positive and growth-oriented mind-set. The purpose of this lesson is to provide patients with a deeper understanding of how to set and achieve personal goals, as well as the tools and strategies needed to overcome common obstacles which may arise along the way.  From Head to Heart: The Power of a Healthy Outlook  Clinical staff led group instruction and group discussion with PowerPoint presentation and patient guidebook. To enhance the learning environment the use of posters, models and videos may be added. Patients will be able to recognize and describe the impact of emotions and mood on physical health. They will discover the importance of self-care and explore self-care practices which may work for them. Patients will also learn how to utilize the 4 C's to cultivate a healthier outlook and better manage stress and challenges. The purpose of this lesson is to demonstrate  to patients how a healthy outlook is an essential part of maintaining good health, especially  as they continue their cardiac rehab journey.  Healthy Sleep for a Healthy Heart Clinical staff led group instruction and group discussion with PowerPoint presentation and patient guidebook. To enhance the learning environment the use of posters, models and videos may be added. At the conclusion of this workshop, patients will be able to demonstrate knowledge of the importance of sleep to overall health, well-being, and quality of life. They will understand the symptoms of, and treatments for, common sleep disorders. Patients will also be able to identify daytime and nighttime behaviors which impact sleep, and they will be able to apply these tools to help manage sleep-related challenges. The purpose of this lesson is to provide patients with a general overview of sleep and outline the importance of quality sleep. Patients will learn about a few of the most common sleep disorders. Patients will also be introduced to the concept of "sleep hygiene," and discover ways to self-manage certain sleeping problems through simple daily behavior changes. Finally, the workshop will motivate patients by clarifying the links between quality sleep and their goals of heart-healthy living.   Recognizing and Reducing Stress Clinical staff led group instruction and group discussion with PowerPoint presentation and patient guidebook. To enhance the learning environment the use of posters, models and videos may be added. At the conclusion of this workshop, patients will be able to understand the types of stress reactions, differentiate between acute and chronic stress, and recognize the impact that chronic stress has on their health. They will also be able to apply different coping mechanisms, such as reframing negative self-talk. Patients will have the opportunity to practice a variety of stress management techniques, such as deep  abdominal breathing, progressive muscle relaxation, and/or guided imagery.  The purpose of this lesson is to educate patients on the role of stress in their lives and to provide healthy techniques for coping with it.  Learning Barriers/Preferences:  Learning Barriers/Preferences - 01/22/24 1411       Learning Barriers/Preferences   Learning Barriers Sight   reading glasses   Learning Preferences Audio;Computer/Internet;Group Instruction;Individual Instruction;Skilled Demonstration;Verbal Instruction;Video;Written Material;Pictoral             Education Topics:  Knowledge Questionnaire Score:  Knowledge Questionnaire Score - 01/22/24 1542       Knowledge Questionnaire Score   Pre Score 22/24             Core Components/Risk Factors/Patient Goals at Admission:  Personal Goals and Risk Factors at Admission - 01/22/24 1411       Core Components/Risk Factors/Patient Goals on Admission    Weight Management Yes;Weight Loss;Obesity    Intervention Weight Management: Develop a combined nutrition and exercise program designed to reach desired caloric intake, while maintaining appropriate intake of nutrient and fiber, sodium and fats, and appropriate energy expenditure required for the weight goal.;Weight Management: Provide education and appropriate resources to help participant work on and attain dietary goals.;Weight Management/Obesity: Establish reasonable short term and long term weight goals.;Obesity: Provide education and appropriate resources to help participant work on and attain dietary goals.    Admit Weight 202 lb 2.6 oz (91.7 kg)    Goal Weight: Long Term 200 lb (90.7 kg)   pt goal   Expected Outcomes Short Term: Continue to assess and modify interventions until short term weight is achieved;Weight Loss: Understanding of general recommendations for a balanced deficit meal plan, which promotes 1-2 lb weight loss per week and includes a negative energy balance of [phone]  kcal/d;Understanding recommendations for meals to  include 15-35% energy as protein, 25-35% energy from fat, 35-60% energy from carbohydrates, less than 200mg  of dietary cholesterol, 20-35 gm of total fiber daily;Understanding of distribution of calorie intake throughout the day with the consumption of 4-5 meals/snacks;Long Term: Adherence to nutrition and physical activity/exercise program aimed toward attainment of established weight goal    Hypertension Yes    Intervention Provide education on lifestyle modifcations including regular physical activity/exercise, weight management, moderate sodium restriction and increased consumption of fresh fruit, vegetables, and low fat dairy, alcohol moderation, and smoking cessation.;Monitor prescription use compliance.    Expected Outcomes Long Term: Maintenance of blood pressure at goal levels.;Short Term: Continued assessment and intervention until BP is < 140/54mm HG in hypertensive participants. < 130/36mm HG in hypertensive participants with diabetes, heart failure or chronic kidney disease.    Lipids Yes    Intervention Provide education and support for participant on nutrition & aerobic/resistive exercise along with prescribed medications to achieve LDL 70mg , HDL >40mg .    Expected Outcomes Short Term: Participant states understanding of desired cholesterol values and is compliant with medications prescribed. Participant is following exercise prescription and nutrition guidelines.;Long Term: Cholesterol controlled with medications as prescribed, with individualized exercise RX and with personalized nutrition plan. Value goals: LDL < 70mg , HDL > 40 mg.             Core Components/Risk Factors/Patient Goals Review:    Core Components/Risk Factors/Patient Goals at Discharge (Final Review):    ITP Comments:  ITP Comments     Row Name 01/22/24 1406           ITP Comments Dr. Armanda Magic medical director. Introduction to pritikin  education/intensive cardiac rehab. Initial orientation packet reviewed with patient.                Comments: Participant attended orientation for the cardiac rehabilitation program on  01/22/2024  to perform initial intake and exercise walk test. Patient introduced to the [facility] education and orientation packet was reviewed. Completed 6-minute walk test, measurements, initial ITP, and exercise prescription. Vital signs stable. Telemetry-normal sinus rhythm with PVCs, asymptomatic.   Service time was from 1330 to 1512.   Jonna Coup, MS, ACSM-CEP 01/22/2024 3:48 PM

## 2024-01-22 NOTE — Progress Notes (Signed)
 Cardiac Rehab Medication Review   Does the patient  feel that his/her medications are working for him/her? Yes    Has the patient been experiencing any side effects to the medications prescribed? No  Does the patient measure his/her own blood pressure or blood glucose at home?   Yes  Does the patient have any problems obtaining medications due to transportation or finances?   No  Understanding of regimen: excellent Understanding of indications: excellent Potential of compliance: excellent    Comments: Sherrill Raring has a great understanding of his medications and regime. He checks his BP, SpO2, weight, and temperature daily and keeps a log.    Jonna Coup, MS, ACSM-CEP 01/22/2024 2:12 PM

## 2024-01-29 ENCOUNTER — Encounter (HOSPITAL_COMMUNITY)

## 2024-01-29 ENCOUNTER — Telehealth (HOSPITAL_COMMUNITY): Payer: Self-pay | Admitting: *Deleted

## 2024-01-29 ENCOUNTER — Telehealth: Payer: Self-pay | Admitting: Cardiovascular Disease

## 2024-01-29 NOTE — H&P (View-Only) (Signed)
 Cardiology Clinic Note   Date: 01/30/2024 ID: Terry Vega, DOB 1946/05/04, MRN [id number]  Primary Cardiologist:  Lorine Bears, MD  Chief Complaint   Terry Vega is a 78 y.o. male who presents to the clinic today for evaluation of chest pain.   Patient Profile   DSHAWN Vega is followed by Dr. Kirke Corin for the history outlined below.      Past medical history significant for: CAD. LHC 12/15/2023 (unstable angina): Proximal to mid RCA 40%.  Distal RCA 90%.  Proximal and mid LAD 70%.  D2 80%.  Mid to distal LM 30%.  D1 99%.  Ostial proximal LCx 70%.  Mid to distal LCx 99%.  OM2 90%.  Recommend transfer to [facility] for CTS evaluation. Echo 12/17/2023: EF 60 to 65%.  No RWMA.  Grade 1 DD.  Normal RV function, mild RVH.  Mild AI.  Mild aortic valve calcification without stenosis.  Borderline dilatation of aortic root 36 mm, borderline dilatation of ascending aorta 38 mm. CABG x 5 12/20/2023: LIMA to LAD, SVG to D1, SVG to D2, SVG to OM, SVG to distal RCA. Frequent PVCs. Hypertension. Hyperlipidemia. Lipid panel 08/04/2023: LDL 91, HDL 42, TG 97, total 152. LPa 12/17/2023: 175.9. Hypothyroid.  In summary, patient with a history of PVCs diagnosed in 2014.  Echo at that time showed normal LV function.  Stress testing showed no evidence of ischemia.  He was initially managed with diltiazem however developed lower extremity edema and was switched to carvedilol with better tolerance.  Follow-up echo September 2022 showed EF 60 to 65%, mild LVH, Grade I DD, mild LAE, mild MR/AI, mild aortic valve sclerosis.  In January 2025 patient was seen with progressive unstable angina and dyspnea.  Diagnostic catheterization revealed severe, multivessel CAD with normal LV function as detailed above.  He was transferred to [facility] and underwent CABG x 5 as detailed above.  Postoperative course was uncomplicated.    Patient was last seen in the office by Ward Givens, NP on 01/02/2024 for hospital  follow-up after CABG.  He was doing well at that time and tolerating walking around his home without angina or dyspnea.  He initially had some lower extremity edema with on discharge but feels that has resolved and had not been taking Lasix.  He was interested in starting cardiac rehab once cleared by CT surgery.  Patient contacted the office on 01/29/2024 with complaints of chest pain.  Per triage RN: "he is about 4 weeks out from CABG and has been doing fine, walking up to one mile daily. Starting Tuesday last week, he noticed that he was only able to walk about 1/2 of his walk because he developed chest tightness and left arm pain. He rested and after about 4-5 min the discomfort went away. Since then he has noticed when walking around his home to get exercise, he will develop the chest tightness and left arm pain. He also has SOB with the discomfort. He is supposed to start cardiac rehab today but will hold off until he is seen tomorrow. Patient aware to lay low today and to call 911 if his discomfort does not go away. He reports he had these symptoms prior to his surgery. Follow up scheduled."     History of Present Illness    Today, patient is accompanied by his wife. He reports he followed up with CT surgery on 3/6 and noted some ankle edema. He was instructed to restart Hyzaar. He then noticed  some increased fatigue around 3/11 along with left sided chest tightness with radiating ache to left arm while completing his 1 mile walk.  Discomfort lasts about 5 minutes and resolves with rest.  He has not tried NTG.  Previously he was walking 1/2 mile resting for a short period of time then completing an additional 1/2 mile without pain.  Pain is similar to previous pain before Catheterization.  He reports associated dyspnea.  He does not develop pain with routine activities in his home but did have some mild discomfort walking into the building from the parking lot today. Patient has not taken Hyzaar since  last week.     ROS: All other systems reviewed and are otherwise negative except as noted in History of Present Illness.  EKGs/Labs Reviewed    EKG Interpretation Date/Time:  Tuesday January 30 2024 10:55:41 EDT Ventricular Rate:  59 PR Interval:  200 QRS Duration:  108 QT Interval:  388 QTC Calculation: 384 R Axis:   21  Text Interpretation: Sinus bradycardia ST & T wave abnormality, consider inferolateral ischemia When compared with ECG of 02-Jan-2024 14:27, ST now depressed in Lateral leads T wave inversion now evident in Inferior leads T wave inversion now evident in Lateral leads Confirmed by Carlos Levering [phone]) on 01/30/2024 10:59:19 AM   12/18/2023: ALT 26; AST 31 01/02/2024: BUN 17; Creatinine, Ser 0.82; Potassium 4.4; Sodium 142   01/02/2024: Hemoglobin 11.2; WBC 7.3   08/04/2023: TSH 4.40    Physical Exam    VS:  BP 132/78 (BP Location: Left Arm, Patient Position: Sitting, Cuff Size: Normal)   Pulse 64   Ht 5\' 7"  (1.702 m)   Wt 199 lb 12.8 oz (90.6 kg)   SpO2 94%   BMI 31.29 kg/m  , BMI Body mass index is 31.29 kg/m.  GEN: Well nourished, well developed, in no acute distress. Neck: No JVD or carotid bruits. Cardiac:  RRR. No murmurs. No rubs or gallops.   Respiratory:  Respirations regular and unlabored. Clear to auscultation without rales, wheezing or rhonchi. GI: Soft, nontender, nondistended. Extremities: Radials/DP/PT 2+ and equal bilaterally. No clubbing or cyanosis. No edema.   Skin: Warm and dry, no rash. Neuro: Strength intact.  Assessment & Plan   CAD with angina/EKG changes S/p CABG x 20 December 2023.  Echo February 2025 demonstrated EF 60 to 65%, no RWMA, Grade I DD, normal RV function, mild RVH, mild AI, borderline dilatation of aortic root 36 mm and ascending aorta 38 mm.  Patient reports developing left sided chest tightness with radiating ache down left arm with mild dyspnea similar to previous angina. He does not get discomfort with routine  activities around his home but did get less intense discomfort making the long walk to the building from the parking lot. EKG today shows new ST depression and T wave inversion in lateral leads and new T wave inversion in inferior leads.  Discussed stress testing vs repeat LHC for further evaluation. Given changes on EKG and patient's recurrent anginal symptoms I think it is reasonable to proceed with LHC. Patient and wife agree. -Continue aspirin, carvedilol, atorvastatin. -Schedule LHC for tomorrow. -CBC and BMP.   Hypertension BP today 132/78. No headaches or dizziness reported.  -Continue carvedilol.  Hyperlipidemia LDL September 2024 91, not at goal.  -Continue atorvastatin.  Disposition: CBC and BMP today.  LHC tomorrow. Return in 2-3 weeks or sooner as needed.      Informed Consent   Shared Decision Making/Informed Consent The  risks [stroke (1 in 1000), death (1 in 1000), kidney failure [usually temporary] (1 in 500), bleeding (1 in 200), allergic reaction [possibly serious] (1 in 200)], benefits (diagnostic support and management of coronary artery disease) and alternatives of a cardiac catheterization were discussed in detail with Mr. Adelsberger and he is willing to proceed.      Signed, Etta Grandchild. Levana Minetti, DNP, NP-C

## 2024-01-29 NOTE — Progress Notes (Unsigned)
 Cardiology Clinic Note   Date: 01/30/2024 ID: Terry Vega, DOB 1946/05/04, MRN [id number]  Primary Cardiologist:  Lorine Bears, MD  Chief Complaint   Terry Vega is a 78 y.o. male who presents to the clinic today for evaluation of chest pain.   Patient Profile   Terry Vega is followed by Dr. Kirke Corin for the history outlined below.      Past medical history significant for: CAD. LHC 12/15/2023 (unstable angina): Proximal to mid RCA 40%.  Distal RCA 90%.  Proximal and mid LAD 70%.  D2 80%.  Mid to distal LM 30%.  D1 99%.  Ostial proximal LCx 70%.  Mid to distal LCx 99%.  OM2 90%.  Recommend transfer to [facility] for CTS evaluation. Echo 12/17/2023: EF 60 to 65%.  No RWMA.  Grade 1 DD.  Normal RV function, mild RVH.  Mild AI.  Mild aortic valve calcification without stenosis.  Borderline dilatation of aortic root 36 mm, borderline dilatation of ascending aorta 38 mm. CABG x 5 12/20/2023: LIMA to LAD, SVG to D1, SVG to D2, SVG to OM, SVG to distal RCA. Frequent PVCs. Hypertension. Hyperlipidemia. Lipid panel 08/04/2023: LDL 91, HDL 42, TG 97, total 152. LPa 12/17/2023: 175.9. Hypothyroid.  In summary, patient with a history of PVCs diagnosed in 2014.  Echo at that time showed normal LV function.  Stress testing showed no evidence of ischemia.  He was initially managed with diltiazem however developed lower extremity edema and was switched to carvedilol with better tolerance.  Follow-up echo September 2022 showed EF 60 to 65%, mild LVH, Grade I DD, mild LAE, mild MR/AI, mild aortic valve sclerosis.  In January 2025 patient was seen with progressive unstable angina and dyspnea.  Diagnostic catheterization revealed severe, multivessel CAD with normal LV function as detailed above.  He was transferred to [facility] and underwent CABG x 5 as detailed above.  Postoperative course was uncomplicated.    Patient was last seen in the office by Ward Givens, NP on 01/02/2024 for hospital  follow-up after CABG.  He was doing well at that time and tolerating walking around his home without angina or dyspnea.  He initially had some lower extremity edema with on discharge but feels that has resolved and had not been taking Lasix.  He was interested in starting cardiac rehab once cleared by CT surgery.  Patient contacted the office on 01/29/2024 with complaints of chest pain.  Per triage RN: "he is about 4 weeks out from CABG and has been doing fine, walking up to one mile daily. Starting Tuesday last week, he noticed that he was only able to walk about 1/2 of his walk because he developed chest tightness and left arm pain. He rested and after about 4-5 min the discomfort went away. Since then he has noticed when walking around his home to get exercise, he will develop the chest tightness and left arm pain. He also has SOB with the discomfort. He is supposed to start cardiac rehab today but will hold off until he is seen tomorrow. Patient aware to lay low today and to call 911 if his discomfort does not go away. He reports he had these symptoms prior to his surgery. Follow up scheduled."     History of Present Illness    Today, patient is accompanied by his wife. He reports he followed up with CT surgery on 3/6 and noted some ankle edema. He was instructed to restart Hyzaar. He then noticed  some increased fatigue around 3/11 along with left sided chest tightness with radiating ache to left arm while completing his 1 mile walk.  Discomfort lasts about 5 minutes and resolves with rest.  He has not tried NTG.  Previously he was walking 1/2 mile resting for a short period of time then completing an additional 1/2 mile without pain.  Pain is similar to previous pain before Catheterization.  He reports associated dyspnea.  He does not develop pain with routine activities in his home but did have some mild discomfort walking into the building from the parking lot today. Patient has not taken Hyzaar since  last week.     ROS: All other systems reviewed and are otherwise negative except as noted in History of Present Illness.  EKGs/Labs Reviewed    EKG Interpretation Date/Time:  Tuesday January 30 2024 10:55:41 EDT Ventricular Rate:  59 PR Interval:  200 QRS Duration:  108 QT Interval:  388 QTC Calculation: 384 R Axis:   21  Text Interpretation: Sinus bradycardia ST & T wave abnormality, consider inferolateral ischemia When compared with ECG of 02-Jan-2024 14:27, ST now depressed in Lateral leads T wave inversion now evident in Inferior leads T wave inversion now evident in Lateral leads Confirmed by Carlos Levering [phone]) on 01/30/2024 10:59:19 AM   12/18/2023: ALT 26; AST 31 01/02/2024: BUN 17; Creatinine, Ser 0.82; Potassium 4.4; Sodium 142   01/02/2024: Hemoglobin 11.2; WBC 7.3   08/04/2023: TSH 4.40    Physical Exam    VS:  BP 132/78 (BP Location: Left Arm, Patient Position: Sitting, Cuff Size: Normal)   Pulse 64   Ht 5\' 7"  (1.702 m)   Wt 199 lb 12.8 oz (90.6 kg)   SpO2 94%   BMI 31.29 kg/m  , BMI Body mass index is 31.29 kg/m.  GEN: Well nourished, well developed, in no acute distress. Neck: No JVD or carotid bruits. Cardiac:  RRR. No murmurs. No rubs or gallops.   Respiratory:  Respirations regular and unlabored. Clear to auscultation without rales, wheezing or rhonchi. GI: Soft, nontender, nondistended. Extremities: Radials/DP/PT 2+ and equal bilaterally. No clubbing or cyanosis. No edema.   Skin: Warm and dry, no rash. Neuro: Strength intact.  Assessment & Plan   CAD with angina/EKG changes S/p CABG x 20 December 2023.  Echo February 2025 demonstrated EF 60 to 65%, no RWMA, Grade I DD, normal RV function, mild RVH, mild AI, borderline dilatation of aortic root 36 mm and ascending aorta 38 mm.  Patient reports developing left sided chest tightness with radiating ache down left arm with mild dyspnea similar to previous angina. He does not get discomfort with routine  activities around his home but did get less intense discomfort making the long walk to the building from the parking lot. EKG today shows new ST depression and T wave inversion in lateral leads and new T wave inversion in inferior leads.  Discussed stress testing vs repeat LHC for further evaluation. Given changes on EKG and patient's recurrent anginal symptoms I think it is reasonable to proceed with LHC. Patient and wife agree. -Continue aspirin, carvedilol, atorvastatin. -Schedule LHC for tomorrow. -CBC and BMP.   Hypertension BP today 132/78. No headaches or dizziness reported.  -Continue carvedilol.  Hyperlipidemia LDL September 2024 91, not at goal.  -Continue atorvastatin.  Disposition: CBC and BMP today.  LHC tomorrow. Return in 2-3 weeks or sooner as needed.      Informed Consent   Shared Decision Making/Informed Consent The  risks [stroke (1 in 1000), death (1 in 1000), kidney failure [usually temporary] (1 in 500), bleeding (1 in 200), allergic reaction [possibly serious] (1 in 200)], benefits (diagnostic support and management of coronary artery disease) and alternatives of a cardiac catheterization were discussed in detail with Mr. Adelsberger and he is willing to proceed.      Signed, Etta Grandchild. Levana Minetti, DNP, NP-C

## 2024-01-29 NOTE — Telephone Encounter (Signed)
 Pt c/o of Chest Pain: STAT if active CP, including tightness, pressure, jaw pain, radiating pain to shoulder/upper arm/back, CP unrelieved by Nitro. Symptoms reported of SOB, nausea, vomiting, sweating.  1. Are you having CP right now? No, only when he exerted hisself. He states before he was able to walk a mile but now he has started to notice tightness in chest and pain in left arm while walking.   2. Are you experiencing any other symptoms (ex. SOB, nausea, vomiting, sweating)? No    3. Is your CP continuous or coming and going? Coming and going    4. Have you taken Nitroglycerin? No    5. How long have you been experiencing CP? Since open heart surgery     6. If NO CP at time of call then end call with telling Pt to call back or call 911 if Chest pain returns prior to return call from triage team.

## 2024-01-29 NOTE — Telephone Encounter (Signed)
 Pt called out for today's cardiac rehab exercise session regarding having symptoms.  Reports that typically he can walk a mile a day around his cu-de-sac with a short rest break at the 1/2 mile mark.  Noticed that when he walked he had chest tightness and pain that radiated down his left arm that subsided with rest.  This is very similar to the pain that brought him to the hospital when he had his CABG.  Pt has upcoming appt on tomorrow therefore would like to hold on exercise today.  Advised pt of agreement of this as well as holding  exercise until plan of care can be determined and clearance obtained to start cardiac rehab when he is stable and appropriate to do so. Alanson Aly, BSN Cardiac and Emergency planning/management officer

## 2024-01-29 NOTE — Telephone Encounter (Signed)
 Spoke with pt, he is about 4 weeks out from CABG and has been doing fine, walking up to one mile daily. Starting Tuesday last week, he noticed that he was only able to walk about 1/2 of his walk because he developed chest tightness and left arm pain. He rested and after about 4-5 min the discomfort went away. Since then he has noticed when walking around his home to get exercise, he will develop the chest tightness and left arm pain. He also has SOB with the discomfort. He is supposed to start cardiac rehab today but will hold off until he is seen tomorrow. Patient aware to lay low today and to call 911 if his discomfort does not go away. He reports he had these symptoms prior to his surgery. Follow up scheduled

## 2024-01-30 ENCOUNTER — Encounter: Payer: Self-pay | Admitting: Student

## 2024-01-30 ENCOUNTER — Ambulatory Visit: Attending: Student | Admitting: Student

## 2024-01-30 ENCOUNTER — Telehealth (HOSPITAL_COMMUNITY): Payer: Self-pay | Admitting: *Deleted

## 2024-01-30 VITALS — BP 132/78 | HR 64 | Ht 67.0 in | Wt 199.8 lb

## 2024-01-30 DIAGNOSIS — R9431 Abnormal electrocardiogram [ECG] [EKG]: Secondary | ICD-10-CM

## 2024-01-30 DIAGNOSIS — I25700 Atherosclerosis of coronary artery bypass graft(s), unspecified, with unstable angina pectoris: Secondary | ICD-10-CM | POA: Diagnosis not present

## 2024-01-30 DIAGNOSIS — I200 Unstable angina: Secondary | ICD-10-CM

## 2024-01-30 DIAGNOSIS — I10 Essential (primary) hypertension: Secondary | ICD-10-CM | POA: Diagnosis not present

## 2024-01-30 DIAGNOSIS — E785 Hyperlipidemia, unspecified: Secondary | ICD-10-CM | POA: Diagnosis not present

## 2024-01-30 NOTE — Patient Instructions (Signed)
 Medication Instructions:  Your Physician recommend you continue on your current medication as directed.    *If you need a refill on your cardiac medications before your next appointment, please call your pharmacy*   Lab Work: Your provider would like for you to have following labs drawn today CBC, and BMeT.   If you have labs (blood work) drawn today and your tests are completely normal, you will receive your results only by: MyChart Message (if you have MyChart) OR A paper copy in the mail If you have any lab test that is abnormal or we need to change your treatment, we will call you to review the results.   Testing/Procedures:  [place] A DEPT OF [place]. [facility] [address] [name], [address] [place] [address] Dept: [phone] Loc: [phone]  AIKEN WITHEM  01/30/2024  You are scheduled for a Cardiac Catheterization on Wednesday, March 19 with Dr. Cristal Deer End.  1. Please arrive at the [facility] Entrance of [facility], [address], [place] [address] at 12:30 PM (This is 1 hour(s) prior to your procedure time).  Proceed to the Check-In Desk directly inside the entrance.  Procedure Parking: Use the entrance off of the [address] side of the hospital. Turn right upon entering and follow the driveway to parking that is directly in front of the [facility]. There is no valet parking available at this entrance, however there is an awning directly in front of the [facility] Center for drop off/ pick up for patients.  Special note: Every effort is made to have your procedure done on time. Please understand that emergencies sometimes delay scheduled procedures.  2. Diet: Do not eat solid foods after midnight.  The patient may have clear liquids until 5am upon the day of the procedure.  3. Labs: You will need to have blood drawn today  4. Medication instructions in  preparation for your procedure:  On the morning of your procedure, take your Aspirin 81 mg.  You may use sips of water.  5. Plan to go home the same day, you will only stay overnight if medically necessary. 6. Bring a current list of your medications and current insurance cards. 7. You MUST have a responsible person to drive you home. 8. Someone MUST be with you the first 24 hours after you arrive home or your discharge will be delayed. 9. Please wear clothes that are easy to get on and off and wear slip-on shoes.  Thank you for allowing [place] to care for you!   -- [facility]    Follow-Up: At [facility], you and your health needs are our priority.  As part of our continuing mission to provide you with exceptional heart care, we have created designated Provider Care Teams.  These Care Teams include your primary Cardiologist (physician) and Advanced Practice Providers (APPs -  Physician Assistants and Nurse Practitioners) who all work together to provide you with the care you need, when you need it.   Your next appointment:   2-3 week(s)  Provider:   Lorine Bears, MD or Carlos Levering, NP

## 2024-01-30 NOTE — Telephone Encounter (Signed)
 Received voice mail message from [place] at 1:44pm 01/30/24 informing us of need to cancel Cardiac rehab sessions tomorrow and next week Monday/Wednesday due to heart catheterization on 3/19. Follow up call to his message today at 1:56pm, he confirmed the cancellations for next week as well and will be in contact regarding future cardiac rehab participation.

## 2024-01-31 ENCOUNTER — Ambulatory Visit
Admission: RE | Admit: 2024-01-31 | Discharge: 2024-01-31 | Disposition: A | Discharge status: Home / Self Care | Attending: Internal Medicine | Admitting: Internal Medicine

## 2024-01-31 ENCOUNTER — Other Ambulatory Visit: Payer: Self-pay

## 2024-01-31 ENCOUNTER — Encounter
Admission: RE | Disposition: A | Discharge status: Home / Self Care | Payer: Self-pay | Source: Home / Self Care | Attending: Internal Medicine

## 2024-01-31 ENCOUNTER — Encounter (HOSPITAL_COMMUNITY)

## 2024-01-31 DIAGNOSIS — Y832 Surgical operation with anastomosis, bypass or graft as the cause of abnormal reaction of the patient, or of later complication, without mention of misadventure at the time of the procedure: Secondary | ICD-10-CM | POA: Insufficient documentation

## 2024-01-31 DIAGNOSIS — I2582 Chronic total occlusion of coronary artery: Secondary | ICD-10-CM | POA: Diagnosis not present

## 2024-01-31 DIAGNOSIS — Z7902 Long term (current) use of antithrombotics/antiplatelets: Secondary | ICD-10-CM | POA: Insufficient documentation

## 2024-01-31 DIAGNOSIS — E785 Hyperlipidemia, unspecified: Secondary | ICD-10-CM | POA: Insufficient documentation

## 2024-01-31 DIAGNOSIS — I25700 Atherosclerosis of coronary artery bypass graft(s), unspecified, with unstable angina pectoris: Secondary | ICD-10-CM

## 2024-01-31 DIAGNOSIS — Z7982 Long term (current) use of aspirin: Secondary | ICD-10-CM | POA: Diagnosis not present

## 2024-01-31 DIAGNOSIS — E039 Hypothyroidism, unspecified: Secondary | ICD-10-CM | POA: Diagnosis not present

## 2024-01-31 DIAGNOSIS — R079 Chest pain, unspecified: Secondary | ICD-10-CM

## 2024-01-31 DIAGNOSIS — Z79899 Other long term (current) drug therapy: Secondary | ICD-10-CM | POA: Insufficient documentation

## 2024-01-31 DIAGNOSIS — I2510 Atherosclerotic heart disease of native coronary artery without angina pectoris: Secondary | ICD-10-CM

## 2024-01-31 DIAGNOSIS — T82857A Stenosis of cardiac prosthetic devices, implants and grafts, initial encounter: Secondary | ICD-10-CM | POA: Diagnosis not present

## 2024-01-31 DIAGNOSIS — I10 Essential (primary) hypertension: Secondary | ICD-10-CM | POA: Diagnosis not present

## 2024-01-31 HISTORY — PX: LEFT HEART CATH AND CORS/GRAFTS ANGIOGRAPHY: CATH118250

## 2024-01-31 HISTORY — PX: CORONARY STENT INTERVENTION: CATH118234

## 2024-01-31 LAB — BASIC METABOLIC PANEL
BUN/Creatinine Ratio: 17 (ref 10–24)
BUN: 14 mg/dL (ref 8–27)
CO2: 25 mmol/L (ref 20–29)
Calcium: 9.7 mg/dL (ref 8.6–10.2)
Chloride: 103 mmol/L (ref 96–106)
Creatinine, Ser: 0.83 mg/dL (ref 0.76–1.27)
Glucose: 99 mg/dL (ref 70–99)
Potassium: 5.2 mmol/L (ref 3.5–5.2)
Sodium: 143 mmol/L (ref 134–144)
eGFR: 90 mL/min/{1.73_m2} (ref >59)

## 2024-01-31 LAB — CBC
Hematocrit: 38.6 % (ref 37.5–51.0)
Hemoglobin: 12.6 g/dL — ABNORMAL LOW (ref 13.0–17.7)
MCH: 29.9 pg (ref 26.6–33.0)
MCHC: 32.6 g/dL (ref 31.5–35.7)
MCV: 92 fL (ref 79–97)
Platelets: 249 10*3/uL (ref 150–450)
RBC: 4.22 x10E6/uL (ref 4.14–5.80)
RDW: 12.4 % (ref 11.6–15.4)
WBC: 6.9 10*3/uL (ref 3.4–10.8)

## 2024-01-31 LAB — POCT ACTIVATED CLOTTING TIME: Activated Clotting Time: 227 s

## 2024-01-31 SURGERY — LEFT HEART CATH AND CORS/GRAFTS ANGIOGRAPHY
Anesthesia: Moderate Sedation

## 2024-01-31 MED ORDER — CLOPIDOGREL BISULFATE 75 MG PO TABS: 75.0000 mg | ORAL_TABLET | Freq: Every day | ORAL | 3 refills | Status: DC

## 2024-01-31 MED ORDER — SODIUM CHLORIDE 0.9 % IV SOLN: 250.0000 mL | INTRAVENOUS | Status: DC | PRN

## 2024-01-31 MED ORDER — ASPIRIN 81 MG PO TBEC: 81.0000 mg | DELAYED_RELEASE_TABLET | Freq: Every day | ORAL | Status: AC

## 2024-01-31 MED ORDER — FENTANYL CITRATE (PF) 100 MCG/2ML IJ SOLN
INTRAMUSCULAR | Status: DC | PRN
  Administered 2024-01-31 (×2): 25 ug via INTRAVENOUS

## 2024-01-31 MED ORDER — ACETAMINOPHEN 325 MG PO TABS: 650.0000 mg | ORAL_TABLET | ORAL | Status: DC | PRN

## 2024-01-31 MED ORDER — VERAPAMIL HCL 2.5 MG/ML IV SOLN
INTRAVENOUS | Status: AC
  Filled 2024-01-31: qty 2

## 2024-01-31 MED ORDER — ONDANSETRON HCL 4 MG/2ML IJ SOLN: 4.0000 mg | Freq: Four times a day (QID) | INTRAMUSCULAR | Status: DC | PRN

## 2024-01-31 MED ORDER — ASPIRIN 81 MG PO CHEW: 81.0000 mg | CHEWABLE_TABLET | ORAL | Status: DC

## 2024-01-31 MED ORDER — SODIUM CHLORIDE 0.9 % IV SOLN: INTRAVENOUS | Status: DC

## 2024-01-31 MED ORDER — ISOSORBIDE MONONITRATE ER 30 MG PO TB24: 30.0000 mg | ORAL_TABLET | Freq: Every day | ORAL | 5 refills | Status: DC

## 2024-01-31 MED ORDER — CLOPIDOGREL BISULFATE 75 MG PO TABS
ORAL_TABLET | ORAL | Status: AC
  Filled 2024-01-31: qty 8

## 2024-01-31 MED ORDER — VERAPAMIL HCL 2.5 MG/ML IV SOLN
INTRAVENOUS | Status: DC | PRN
Start: 2024-01-31 — End: 2024-01-31
  Administered 2024-01-31: 2.5 mg via INTRA_ARTERIAL

## 2024-01-31 MED ORDER — CLOPIDOGREL BISULFATE 75 MG PO TABS
ORAL_TABLET | ORAL | Status: DC | PRN
  Administered 2024-01-31: 600 mg via ORAL

## 2024-01-31 MED ORDER — HEPARIN SODIUM (PORCINE) 1000 UNIT/ML IJ SOLN
INTRAMUSCULAR | Status: DC | PRN
  Administered 2024-01-31: 4500 [IU] via INTRAVENOUS
  Administered 2024-01-31: 5000 [IU] via INTRAVENOUS

## 2024-01-31 MED ORDER — HEPARIN SODIUM (PORCINE) 1000 UNIT/ML IJ SOLN
INTRAMUSCULAR | Status: AC
  Filled 2024-01-31: qty 10

## 2024-01-31 MED ORDER — IOHEXOL 300 MG/ML  SOLN
INTRAMUSCULAR | Status: DC | PRN
Start: 2024-01-31 — End: 2024-01-31
  Administered 2024-01-31: 96 mL

## 2024-01-31 MED ORDER — HEPARIN (PORCINE) IN NACL 1000-0.9 UT/500ML-% IV SOLN
INTRAVENOUS | Status: DC | PRN
  Administered 2024-01-31: 1000 mL

## 2024-01-31 MED ORDER — SODIUM CHLORIDE 0.9% FLUSH: 3.0000 mL | Freq: Two times a day (BID) | INTRAVENOUS | Status: DC

## 2024-01-31 MED ORDER — MIDAZOLAM HCL 2 MG/2ML IJ SOLN
INTRAMUSCULAR | Status: AC
  Filled 2024-01-31: qty 2

## 2024-01-31 MED ORDER — HYDRALAZINE HCL 20 MG/ML IJ SOLN: 10.0000 mg | INTRAMUSCULAR | Status: DC | PRN

## 2024-01-31 MED ORDER — MIDAZOLAM HCL 2 MG/2ML IJ SOLN
INTRAMUSCULAR | Status: DC | PRN
  Administered 2024-01-31 (×2): 1 mg via INTRAVENOUS

## 2024-01-31 MED ORDER — LABETALOL HCL 5 MG/ML IV SOLN: 10.0000 mg | INTRAVENOUS | Status: DC | PRN

## 2024-01-31 MED ORDER — SODIUM CHLORIDE 0.9% FLUSH: 3.0000 mL | INTRAVENOUS | Status: DC | PRN

## 2024-01-31 MED ORDER — FENTANYL CITRATE (PF) 100 MCG/2ML IJ SOLN
INTRAMUSCULAR | Status: AC
  Filled 2024-01-31: qty 2

## 2024-01-31 MED ORDER — HEPARIN (PORCINE) IN NACL 1000-0.9 UT/500ML-% IV SOLN
INTRAVENOUS | Status: AC
  Filled 2024-01-31: qty 1000

## 2024-01-31 MED ORDER — LIDOCAINE HCL (PF) 1 % IJ SOLN
INTRAMUSCULAR | Status: DC | PRN
Start: 2024-01-31 — End: 2024-01-31
  Administered 2024-01-31: 2 mL

## 2024-01-31 MED ORDER — NITROGLYCERIN 1 MG/10 ML FOR IR/CATH LAB
INTRA_ARTERIAL | Status: AC
  Filled 2024-01-31: qty 10

## 2024-01-31 SURGICAL SUPPLY — 17 items
CATH INFINITI 5 FR IM (CATHETERS) IMPLANT
CATH INFINITI 5 FR MPA2 (CATHETERS) IMPLANT
CATH VISTA GUIDE 6FR XBLD 3.5 (CATHETERS) IMPLANT
DEVICE RAD TR BAND REGULAR (VASCULAR PRODUCTS) IMPLANT
DRAPE BRACHIAL (DRAPES) IMPLANT
GLIDESHEATH SLEND A-KIT 6F 22G (SHEATH) IMPLANT
GUIDEWIRE INQWIRE 1.5J.035X260 (WIRE) IMPLANT
INQWIRE 1.5J .035X260CM (WIRE) ×2 IMPLANT
KIT ENCORE 26 ADVANTAGE (KITS) IMPLANT
PACK CARDIAC CATH (CUSTOM PROCEDURE TRAY) ×3 IMPLANT
PAD ELECT DEFIB RADIOL ZOLL (MISCELLANEOUS) IMPLANT
PROTECTION STATION PRESSURIZED (MISCELLANEOUS) ×2 IMPLANT
SET ATX-X65L (MISCELLANEOUS) IMPLANT
STATION PROTECTION PRESSURIZED (MISCELLANEOUS) IMPLANT
TUBING CIL FLEX 10 FLL-RA (TUBING) IMPLANT
WIRE G HI TQ BMW 190 (WIRE) IMPLANT
WIRE RUNTHROUGH IZANAI 014 180 (WIRE) IMPLANT

## 2024-01-31 NOTE — Interval H&P Note (Signed)
 History and Physical Interval Note:  01/31/2024 1:33 PM  Terry Vega  has presented today for surgery, with the diagnosis of coronary artery disease with unstable angina.  The various methods of treatment have been discussed with the patient and family. After consideration of risks, benefits and other options for treatment, the patient has consented to  Procedure(s): LEFT HEART CATH AND CORS/GRAFTS ANGIOGRAPHY (Left) as a surgical intervention.  The patient's history has been reviewed, patient examined, no change in status, stable for surgery.  I have reviewed the patient's chart and labs.  Questions were answered to the patient's satisfaction.    Cath Lab Visit (complete for each Cath Lab visit)  Clinical Evaluation Leading to the Procedure:   ACS: No.  Non-ACS:    Anginal Classification: CCS III  Anti-ischemic medical therapy: Minimal Therapy (1 class of medications)  Non-Invasive Test Results: No non-invasive testing performed  Prior CABG: Previous CABG  Terry Vega

## 2024-02-01 ENCOUNTER — Encounter: Payer: Self-pay | Admitting: Internal Medicine

## 2024-02-02 ENCOUNTER — Encounter (HOSPITAL_COMMUNITY)

## 2024-02-02 ENCOUNTER — Telehealth: Payer: Self-pay | Admitting: *Deleted

## 2024-02-02 NOTE — Telephone Encounter (Signed)
 Appointment moved up to 02/15/24, per staff message request. Patient has been made aware.

## 2024-02-05 ENCOUNTER — Encounter (HOSPITAL_COMMUNITY)

## 2024-02-07 ENCOUNTER — Ambulatory Visit: Payer: Medicare HMO | Admitting: Nurse Practitioner

## 2024-02-08 ENCOUNTER — Encounter: Payer: Self-pay | Admitting: Cardiovascular Disease

## 2024-02-08 ENCOUNTER — Telehealth (HOSPITAL_COMMUNITY): Payer: Self-pay | Admitting: *Deleted

## 2024-02-08 ENCOUNTER — Encounter (HOSPITAL_COMMUNITY): Payer: Self-pay | Admitting: *Deleted

## 2024-02-08 ENCOUNTER — Ambulatory Visit: Attending: Cardiovascular Disease | Admitting: Cardiovascular Disease

## 2024-02-08 VITALS — BP 124/66 | HR 66 | Ht 67.0 in | Wt 197.0 lb

## 2024-02-08 DIAGNOSIS — I25700 Atherosclerosis of coronary artery bypass graft(s), unspecified, with unstable angina pectoris: Secondary | ICD-10-CM | POA: Diagnosis not present

## 2024-02-08 DIAGNOSIS — I10 Essential (primary) hypertension: Secondary | ICD-10-CM

## 2024-02-08 DIAGNOSIS — I493 Ventricular premature depolarization: Secondary | ICD-10-CM | POA: Diagnosis not present

## 2024-02-08 DIAGNOSIS — Z951 Presence of aortocoronary bypass graft: Secondary | ICD-10-CM

## 2024-02-08 DIAGNOSIS — E785 Hyperlipidemia, unspecified: Secondary | ICD-10-CM | POA: Diagnosis not present

## 2024-02-08 DIAGNOSIS — I25708 Atherosclerosis of coronary artery bypass graft(s), unspecified, with other forms of angina pectoris: Secondary | ICD-10-CM | POA: Diagnosis not present

## 2024-02-08 MED ORDER — ISOSORBIDE MONONITRATE ER 60 MG PO TB24: 60.0000 mg | ORAL_TABLET | Freq: Every day | ORAL | 1 refills | Status: DC

## 2024-02-08 NOTE — Progress Notes (Unsigned)
 Cardiology Office Note   Date:  02/09/2024   ID:  Terry, Vega 08/24/1946, MRN [id number]  PCP:  Shade Flood, MD  Cardiologist:   Lorine Bears, MD   No chief complaint on file.     History of Present Illness: Terry Vega is a 78 y.o. male who presents for a followup visit regarding coronary artery disease and PVCs.   He has prolonged history of hypertension. He has hypothyroidism and has been on replacement therapy for many years.  He has been followed for a long time for PVCs that responded to a beta-blocker. He had unstable angina in January of this year.  Cardiac catheterization showed severe three-vessel coronary artery disease.  He was transferred to [facility] and underwent CABG with LIMA to LAD, SVG to first diagonal, SVG to second diagonal, SVG to OM and SVG to distal RCA.  Postoperative course was uncomplicated. He was seen in the office in February and was doing well with no anginal symptoms.  However, he was seen on March 18 with recurrent angina.  Thus, he underwent cardiac catheterization which showed stable native three-vessel coronary artery disease with 2 occluded grafts including SVG to OM and SVG to first diagonal.  LIMA to LAD, SVG to second diagonal and SVG to right PDA were patent.  EF was normal with an LVEDP of 20 mmHg. Native left circumflex had complex bifurcation mid disease with subtotal occlusion.  PCI was attempted but was not able to cross with a wire.  Imdur 30 mg daily was added.  He reports some improvement in angina but did not resolve completely.  He has no chest pain with regular everyday activities but does have mild chest pain if he goes on a long walk than a quarter of a mile.  He has no rest pain.  Past Medical History:  Diagnosis Date   Allergy    Arthritis    [employer] 07/21/2020   CAD (coronary artery disease)    a. 11/2023 Cath: LM 53m/d, LAD 70p/m, D1 99, D2 80, LCX 70ost, 50m/d, OM2 90, RCA 40p/m, 90d, EF 55-65%; b.  12/2023 CABG x 5: LIMA->LAD, VG->D1, VG->D2, VG->OM, VG->dRCA.   Diastolic dysfunction    a. 07/2021 Echo: EF 60-65%.  Mild LVH.  Grade 1 DD.  Normal RV fxn. mildly dil LA. Triv MR/AI. Mild AoV sclerosis.   Heart murmur    Hypertension    Hypothyroidism    PVC's (premature ventricular contractions)     Past Surgical History:  Procedure Laterality Date   COLONOSCOPY     CORONARY ARTERY BYPASS GRAFT N/A 12/19/2023   Procedure: CORONARY ARTERY BYPASS GRAFTING (CABG) TIMES FIVE USING LEFT INTERNAL MAMMARY ARTERY AND BILATERAL ENDOSCOPICALLY HARVESTED GREATER SAPHENOUS VEIN;  Surgeon: Alleen Borne, MD;  Location: [facility] OR;  Service: Open Heart Surgery;  Laterality: N/A;   CORONARY STENT INTERVENTION N/A 01/31/2024   Procedure: CORONARY STENT INTERVENTION;  Surgeon: Yvonne Kendall, MD;  Location: [facility] INVASIVE CV LAB;  Service: Cardiovascular;  Laterality: N/A;   HEMORRHOID SURGERY     LEFT HEART CATH AND CORONARY ANGIOGRAPHY Left 12/15/2023   Procedure: LEFT HEART CATH AND CORONARY ANGIOGRAPHY;  Surgeon: Iran Ouch, MD;  Location: [facility] INVASIVE CV LAB;  Service: Cardiovascular;  Laterality: Left;   LEFT HEART CATH AND CORS/GRAFTS ANGIOGRAPHY Left 01/31/2024   Procedure: LEFT HEART CATH AND CORS/GRAFTS ANGIOGRAPHY;  Surgeon: Yvonne Kendall, MD;  Location: [facility] INVASIVE CV LAB;  Service: Cardiovascular;  Laterality: Left;  TEE WITHOUT CARDIOVERSION N/A 12/19/2023   Procedure: TRANSESOPHAGEAL ECHOCARDIOGRAM (TEE);  Surgeon: Alleen Borne, MD;  Location: [facility] OR;  Service: Open Heart Surgery;  Laterality: N/A;   VASECTOMY       Current Outpatient Medications  Medication Sig Dispense Refill   aspirin EC 81 MG tablet Take 1 tablet (81 mg total) by mouth daily. Swallow whole.     atorvastatin (LIPITOR) 40 MG tablet Take 1 tablet (40 mg total) by mouth daily. 30 tablet 6   carvedilol (COREG) 6.25 MG tablet Take 1 tablet (6.25 mg total) by mouth 2 (two) times daily. 180 tablet 3    cholecalciferol (VITAMIN D) 1000 UNITS tablet Take 1,000 Units by mouth daily.     clopidogrel (PLAVIX) 75 MG tablet Take 1 tablet (75 mg total) by mouth daily. 90 tablet 3   levothyroxine (SYNTHROID) 100 MCG tablet Take 1 tablet (100 mcg total) by mouth daily. 90 tablet 3   Multiple Vitamin (MULTIVITAMIN) tablet Take 1 tablet by mouth daily.     nitroGLYCERIN (NITROSTAT) 0.4 MG SL tablet Place 0.4 mg under the tongue every 5 (five) minutes as needed for chest pain.     PREVIDENT 5000 BOOSTER PLUS 1.1 % PSTE Place 1 application  onto teeth at bedtime.     triamcinolone cream (KENALOG) 0.1 % Apply 1 Application topically daily as needed (irritation).     vitamin E 1000 UNIT capsule Take 1,000 Units by mouth daily.     isosorbide mononitrate (IMDUR) 60 MG 24 hr tablet Take 1 tablet (60 mg total) by mouth daily. 90 tablet 1   No current facility-administered medications for this visit.    Allergies:   Patient has no known allergies.    Social History:  The patient  reports that he has never smoked. He has never used smokeless tobacco. He reports current alcohol use. He reports that he does not use drugs.   Family History:  The patient's family history includes Cancer in his father; Diabetes in his father and maternal grandmother; Heart disease in his mother; Stroke in his maternal grandmother.    ROS:  Please see the history of present illness.   Otherwise, review of systems are positive for none.   All other systems are reviewed and negative.    PHYSICAL EXAM: VS:  BP 124/66 (BP Location: Left Arm, Patient Position: Sitting, Cuff Size: Normal)   Pulse 66   Ht 5\' 7"  (1.702 m)   Wt 197 lb (89.4 kg)   SpO2 96%   BMI 30.85 kg/m  , BMI Body mass index is 30.85 kg/m. GEN: Well nourished, well developed, in no acute distress  HEENT: normal  Neck: no JVD, carotid bruits, or masses Cardiac: RRR; no murmurs, rubs, or gallops,no edema  Respiratory:  clear to auscultation bilaterally, normal  work of breathing GI: soft, nontender, nondistended, + BS MS: no deformity or atrophy  Skin: warm and dry, no rash Neuro:  Strength and sensation are intact Psych: euthymic mood, full affect   EKG:  EKG is ordered today. The ekg ordered today demonstrates : Normal sinus rhythm Nonspecific T wave abnormality           Recent Labs: 08/04/2023: TSH 4.40 12/18/2023: ALT 26 12/20/2023: Magnesium 2.5 01/30/2024: BUN 14; Creatinine, Ser 0.83; Hemoglobin 12.6; Platelets 249; Potassium 5.2; Sodium 143    Lipid Panel    Component Value Date/Time   CHOL 152 08/04/2023 0839   CHOL 162 01/23/2020 1342   TRIG 97.0 08/04/2023 0839  HDL 42.00 08/04/2023 0839   HDL 41 01/23/2020 1342   CHOLHDL 4 08/04/2023 0839   VLDL 19.4 08/04/2023 0839   LDLCALC 91 08/04/2023 0839   LDLCALC 102 (H) 01/23/2020 1342      Wt Readings from Last 3 Encounters:  02/08/24 197 lb (89.4 kg)  01/31/24 196 lb 6.4 oz (89.1 kg)  01/30/24 199 lb 12.8 oz (90.6 kg)         No data to display            ASSESSMENT AND PLAN:  1.  Coronary artery disease involving bypass graft with stable angina symptoms now are consistent with class II angina.  I had an extensive discussion with him and reviewed the cardiac cath images with him and his family.  I suspect that his angina is due to ischemia in the left circumflex distribution.  However, the mid left circumflex disease appears to be subtotally occluded with complex bifurcation disease not straightforward PCI.  Attempted PCI by Dr. Okey Dupre was not successful due to inability to cross with a wire.  I discussed management options including maximizing his antianginal therapy versus proceeding with another attempt of PCI likely utilizing CTO wires. Given improvement in his symptoms, I elected to increase Imdur to 60 mg once daily.  I asked him to resume cardiac rehab and will monitor his symptoms closely.  2.  PVCs: No evidence of recurrent palpitations on carvedilol.     3. Essential hypertension: He is no longer on losartan-hydrochlorothiazide and his  blood pressure seems to be controlled.  I elected not to resume losartan/hydrochlorothiazide especially that Imdur was increased today.  4.  Hyperlipidemia: Continue atorvastatin 40 mg daily.  Recommended target LDL of less than 55.   Disposition:   FU with me in 1 month  Signed,  Lorine Bears, MD  02/09/2024 8:01 AM    [facility]

## 2024-02-08 NOTE — Patient Instructions (Signed)
 Medication Instructions:  INCREASE the Imdur to 60 mg once daily  *If you need a refill on your cardiac medications before your next appointment, please call your pharmacy*   Lab Work: None ordered If you have labs (blood work) drawn today and your tests are completely normal, you will receive your results only by: MyChart Message (if you have MyChart) OR A paper copy in the mail If you have any lab test that is abnormal or we need to change your treatment, we will call you to review the results.   Testing/Procedures: None ordered   Follow-Up: At [facility], you and your health needs are our priority.  As part of our continuing mission to provide you with exceptional heart care, we have created designated Provider Care Teams.  These Care Teams include your primary Cardiologist (physician) and Advanced Practice Providers (APPs -  Physician Assistants and Nurse Practitioners) who all work together to provide you with the care you need, when you need it.  We recommend signing up for the patient portal called "MyChart".  Sign up information is provided on this After Visit Summary.  MyChart is used to connect with patients for Virtual Visits (Telemedicine).  Patients are able to view lab/test results, encounter notes, upcoming appointments, etc.  Non-urgent messages can be sent to your provider as well.   To learn more about what you can do with MyChart, go to [email or web address].    Your next appointment:   1 month(s)  Provider:   You may see Lorine Bears, MD or one of the following Advanced Practice Providers on your designated Care Team:   Nicolasa Ducking, NP Eula Listen, PA-C Cadence Fransico Michael, PA-C Charlsie Quest, NP Carlos Levering, NP    Other Instructions You can resume cardiac rehab

## 2024-02-08 NOTE — Telephone Encounter (Signed)
 Patient has been cleared to return to cardiac rehab per Dr Kirke Corin. Terry Vega plans to return on 02/19/24 to begin exercise. Terry Vega is starting his new dose of isosorbide and wants to given the medication time to take effect. Thayer Headings RN BSN

## 2024-02-08 NOTE — Progress Notes (Signed)
 Cardiac Individual Treatment Plan  Patient Details  Name: Terry Vega MRN: [id number] Date of Birth: [birth year] Referring Provider:   Flowsheet Row INTENSIVE CARDIAC REHAB ORIENT from 01/22/2024 in [facility] for [facility]  Referring Provider Lorine Bears, MD       Initial Encounter Date:  Flowsheet Row INTENSIVE CARDIAC REHAB ORIENT from 01/22/2024 in [facility] for Heart, Vascular, & Lung Health  Date 01/22/24       Visit Diagnosis: 12/20/23 CABG x 5  Patient's Home Medications on Admission:  Current Outpatient Medications:    aspirin EC 81 MG tablet, Take 1 tablet (81 mg total) by mouth daily. Swallow whole., Disp: , Rfl:    atorvastatin (LIPITOR) 40 MG tablet, Take 1 tablet (40 mg total) by mouth daily., Disp: 30 tablet, Rfl: 6   carvedilol (COREG) 6.25 MG tablet, Take 1 tablet (6.25 mg total) by mouth 2 (two) times daily., Disp: 180 tablet, Rfl: 3   cholecalciferol (VITAMIN D) 1000 UNITS tablet, Take 1,000 Units by mouth daily., Disp: , Rfl:    clopidogrel (PLAVIX) 75 MG tablet, Take 1 tablet (75 mg total) by mouth daily., Disp: 90 tablet, Rfl: 3   isosorbide mononitrate (IMDUR) 60 MG 24 hr tablet, Take 1 tablet (60 mg total) by mouth daily., Disp: 90 tablet, Rfl: 1   levothyroxine (SYNTHROID) 100 MCG tablet, Take 1 tablet (100 mcg total) by mouth daily., Disp: 90 tablet, Rfl: 3   Multiple Vitamin (MULTIVITAMIN) tablet, Take 1 tablet by mouth daily., Disp: , Rfl:    nitroGLYCERIN (NITROSTAT) 0.4 MG SL tablet, Place 0.4 mg under the tongue every 5 (five) minutes as needed for chest pain., Disp: , Rfl:    PREVIDENT 5000 BOOSTER PLUS 1.1 % PSTE, Place 1 application  onto teeth at bedtime., Disp: , Rfl:    triamcinolone cream (KENALOG) 0.1 %, Apply 1 Application topically daily as needed (irritation)., Disp: , Rfl:    vitamin E 1000 UNIT capsule, Take 1,000 Units by mouth daily., Disp: , Rfl:   Past Medical  History: Past Medical History:  Diagnosis Date   Allergy    Arthritis    [employer] 07/21/2020   CAD (coronary artery disease)    a. 11/2023 Cath: LM 3m/d, LAD 70p/m, D1 99, D2 80, LCX 70ost, 3m/d, OM2 90, RCA 40p/m, 90d, EF 55-65%; b. 12/2023 CABG x 5: LIMA->LAD, VG->D1, VG->D2, VG->OM, VG->dRCA.   Diastolic dysfunction    a. 07/2021 Echo: EF 60-65%.  Mild LVH.  Grade 1 DD.  Normal RV fxn. mildly dil LA. Triv MR/AI. Mild AoV sclerosis.   Heart murmur    Hypertension    Hypothyroidism    PVC's (premature ventricular contractions)     Tobacco Use: Social History   Tobacco Use  Smoking Status Never  Smokeless Tobacco Never    Labs: Review Flowsheet  More data exists      Latest Ref Rng & Units 07/28/2021 08/01/2022 08/04/2023 12/18/2023 12/19/2023  Labs for ITP Cardiac and Pulmonary Rehab  Cholestrol 0 - 200 mg/dL 409  811  914  - -  LDL (calc) 0 - 99 mg/dL 782  956  91  - -  HDL-C >39.00 mg/dL 21.30  86.57  84.69  - -  Trlycerides 0.0 - 149.0 mg/dL 62.9  528.4  13.2  - -  Hemoglobin A1c 4.8 - 5.6 % 5.3  - - 5.2  -  PH, Arterial 7.35 - 7.45 - - - 7.45  7.340  7.336  7.406  7.431  7.408  7.402  7.358  7.382   PCO2 arterial 32 - 48 mmHg - - - 43  44.8  45.6  38.2  37.8  40.3  42.0  48.2  43.9   Bicarbonate 20.0 - 28.0 mmol/L - - - 29.9  24.0  24.2  24.3  25.1  25.5  26.1  26.2  27.1  26.1   TCO2 22 - 32 mmol/L - - - - 25  26  26  26  27  27  25  27  27  28  29  26  27  29    Acid-base deficit 0.0 - 2.0 mmol/L - - - - 2.0  2.0  1.0   O2 Saturation % - - - 97.2  98  98  93  100  100  100  84  100  100     Details       Multiple values from one day are sorted in reverse-chronological order         Capillary Blood Glucose: Lab Results  Component Value Date   GLUCAP 141 (H) 12/21/2023   GLUCAP 96 12/21/2023   GLUCAP 115 (H) 12/20/2023   GLUCAP 142 (H) 12/20/2023   GLUCAP 115 (H) 12/20/2023     Exercise Target Goals: Exercise Program Goal: Individual exercise prescription  set using results from initial 6 min walk test and THRR while considering  patient's activity barriers and safety.   Exercise Prescription Goal: Starting with aerobic activity 30 plus minutes a day, 3 days per week for initial exercise prescription. Provide home exercise prescription and guidelines that participant acknowledges understanding prior to discharge.  Activity Barriers & Risk Stratification:  Activity Barriers & Cardiac Risk Stratification - 01/22/24 1409       Activity Barriers & Cardiac Risk Stratification   Activity Barriers Incisional Pain;Balance Concerns;Joint Problems    Cardiac Risk Stratification High   <5 METs on            6 Minute Walk:  6 Minute Walk     Row Name 01/22/24 1539         6 Minute Walk   Phase Initial     Distance 1420 feet     Walk Time 6 minutes     # of Rest Breaks 0     MPH 2.69     METS 2.6     RPE 11     Perceived Dyspnea  0     VO2 Peak 9.09     Symptoms No     Resting HR 75 bpm     Resting BP 106/62     Resting Oxygen Saturation  95 %     Exercise Oxygen Saturation  during 6 min walk 93 %     Max Ex. HR 108 bpm     Max Ex. BP 142/64     2 Minute Post BP 112/68              Oxygen Initial Assessment:   Oxygen Re-Evaluation:   Oxygen Discharge (Final Oxygen Re-Evaluation):   Initial Exercise Prescription:  Initial Exercise Prescription - 01/22/24 1500       Date of Initial Exercise RX and Referring Provider   Date 01/22/24    Referring Provider Lorine Bears, MD    Expected Discharge Date 04/17/24      Recumbant Bike   Level 1    RPM 50    Watts 30  Minutes 15    METs 2      NuStep   Level 2    SPM 75    Minutes 15    METs 2.3      Prescription Details   Frequency (times per week) 3    Duration Progress to 30 minutes of continuous aerobic without signs/symptoms of physical distress      Intensity   THRR 40-80% of Max Heartrate 56-113    Ratings of Perceived Exertion 11-13     Perceived Dyspnea 0-4      Progression   Progression Continue progressive overload as per policy without signs/symptoms or physical distress.      Resistance Training   Training Prescription Yes    Weight 3    Reps 10-15             Perform Capillary Blood Glucose checks as needed.  Exercise Prescription Changes:   Exercise Comments:   Exercise Goals and Review:   Exercise Goals     Row Name 01/22/24 1409             Exercise Goals   Increase Physical Activity Yes       Intervention Provide advice, education, support and counseling about physical activity/exercise needs.;Develop an individualized exercise prescription for aerobic and resistive training based on initial evaluation findings, risk stratification, comorbidities and participant's personal goals.       Expected Outcomes Short Term: Attend rehab on a regular basis to increase amount of physical activity.;Long Term: Exercising regularly at least 3-5 days a week.;Long Term: Add in home exercise to make exercise part of routine and to increase amount of physical activity.       Increase Strength and Stamina Yes       Intervention Provide advice, education, support and counseling about physical activity/exercise needs.;Develop an individualized exercise prescription for aerobic and resistive training based on initial evaluation findings, risk stratification, comorbidities and participant's personal goals.       Expected Outcomes Short Term: Increase workloads from initial exercise prescription for resistance, speed, and METs.;Short Term: Perform resistance training exercises routinely during rehab and add in resistance training at home;Long Term: Improve cardiorespiratory fitness, muscular endurance and strength as measured by increased METs and functional capacity ( )       Able to understand and use rate of perceived exertion (RPE) scale Yes       Intervention Provide education and explanation on how to use RPE  scale       Expected Outcomes Short Term: Able to use RPE daily in rehab to express subjective intensity level;Long Term:  Able to use RPE to guide intensity level when exercising independently       Knowledge and understanding of Target Heart Rate Range (THRR) Yes       Intervention Provide education and explanation of THRR including how the numbers were predicted and where they are located for reference       Expected Outcomes Short Term: Able to state/look up THRR;Short Term: Able to use daily as guideline for intensity in rehab;Long Term: Able to use THRR to govern intensity when exercising independently       Understanding of Exercise Prescription Yes       Intervention Provide education, explanation, and written materials on patient's individual exercise prescription       Expected Outcomes Short Term: Able to explain program exercise prescription;Long Term: Able to explain home exercise prescription to exercise independently  Exercise Goals Re-Evaluation :    Discharge Exercise Prescription (Final Exercise Prescription Changes):   Nutrition:  Target Goals: Understanding of nutrition guidelines, daily intake of sodium 1500mg , cholesterol 200mg , calories 30% from fat and 7% or less from saturated fats, daily to have 5 or more servings of fruits and vegetables.  Biometrics:  Pre Biometrics - 01/22/24 1407       Pre Biometrics   Waist Circumference 42.5 inches    Hip Circumference 43.5 inches    Waist to Hip Ratio 0.98 %    Triceps Skinfold 23 mm    % Body Fat 32.4 %    Grip Strength 36 kg    Flexibility 11 in    Single Leg Stand 1.25 seconds              Nutrition Therapy Plan and Nutrition Goals:   Nutrition Assessments:  MEDIFICTS Score Key: >=70 Need to make dietary changes  40-70 Heart Healthy Diet <= 40 Therapeutic Level Cholesterol Diet   Picture Your Plate Scores: <40 Unhealthy dietary pattern with much room for improvement. 41-50  Dietary pattern unlikely to meet recommendations for good health and room for improvement. 51-60 More healthful dietary pattern, with some room for improvement.  >60 Healthy dietary pattern, although there may be some specific behaviors that could be improved.    Nutrition Goals Re-Evaluation:   Nutrition Goals Discharge (Final Nutrition Goals Re-Evaluation):   Psychosocial: Target Goals: Acknowledge presence or absence of significant depression and/or stress, maximize coping skills, provide positive support system. Participant is able to verbalize types and ability to use techniques and skills needed for reducing stress and depression.  Initial Review & Psychosocial Screening:  Initial Psych Review & Screening - 01/22/24 1409       Initial Review   Current issues with None Identified      Family Dynamics   Good Support System? Yes   Wife for support   Comments Terry Vega denies any feelings of anxiety/stress/depression. He did share that he is waking up every 2 hours at night to use the restroom, he said it has become less frequent the past few weeks and he has no trouble falling back asleep. Denies any need for additional resources at this time.      Barriers   Psychosocial barriers to participate in program There are no identifiable barriers or psychosocial needs.      Screening Interventions   Interventions Encouraged to exercise;Provide feedback about the scores to participant    Expected Outcomes Short Term goal: Identification and review with participant of any Quality of Life or Depression concerns found by scoring the questionnaire.;Long Term goal: The participant improves quality of Life and PHQ9 Scores as seen by post scores and/or verbalization of changes             Quality of Life Scores:  Quality of Life - 01/22/24 1542       Quality of Life   Select Quality of Life      Quality of Life Scores   Health/Function Pre 26.2 %    Socioeconomic Pre 28.21 %     Psych/Spiritual Pre 27.21 %    Family Pre 28.8 %    GLOBAL Pre 27.21 %            Scores of 19 and below usually indicate a poorer quality of life in these areas.  A difference of  2-3 points is a clinically meaningful difference.  A difference of 2-3 points in the total  score of the Quality of Life Index has been associated with significant improvement in overall quality of life, self-image, physical symptoms, and general health in studies assessing change in quality of life.  PHQ-9: Review Flowsheet  More data exists      01/22/2024 08/04/2023 05/31/2023 09/01/2022 07/28/2021  Depression screen PHQ 2/9  Decreased Interest 0 0 0 0 0  Down, Depressed, Hopeless 0 0 0 0 0  PHQ - 2 Score 0 0 0 0 0  Altered sleeping 1 0 0 - 0  Tired, decreased energy 1 1 0 - 0  Change in appetite 1 0 0 - 0  Feeling bad or failure about yourself  0 0 0 - 0  Trouble concentrating 0 0 0 - 0  Moving slowly or fidgety/restless 0 0 0 - 0  Suicidal thoughts 0 0 0 - 0  PHQ-9 Score 3 1 0 - 0  Difficult doing work/chores Not difficult at all Not difficult at all Not difficult at all - -   Interpretation of Total Score  Total Score Depression Severity:  1-4 = Minimal depression, 5-9 = Mild depression, 10-14 = Moderate depression, 15-19 = Moderately severe depression, 20-27 = Severe depression   Psychosocial Evaluation and Intervention:   Psychosocial Re-Evaluation:   Psychosocial Discharge (Final Psychosocial Re-Evaluation):   Vocational Rehabilitation: Provide vocational rehab assistance to qualifying candidates.   Vocational Rehab Evaluation & Intervention:  Vocational Rehab - 01/22/24 1542       Initial Vocational Rehab Evaluation & Intervention   Assessment shows need for Vocational Rehabilitation No   Terry Vega is retired            Education: Education Goals: Education classes will be provided on a weekly basis, covering required topics. Participant will state understanding/return  demonstration of topics presented.  Learning Barriers/Preferences:  Learning Barriers/Preferences - 01/22/24 1411       Learning Barriers/Preferences   Learning Barriers Sight   reading glasses   Learning Preferences Audio;Computer/Internet;Group Instruction;Individual Instruction;Skilled Demonstration;Verbal Instruction;Video;Written Material;Pictoral             Education Topics: Hypertension, Hypertension Reduction -Define heart disease and high blood pressure. Discus how high blood pressure affects the body and ways to reduce high blood pressure.   Exercise and Your Heart -Discuss why it is important to exercise, the FITT principles of exercise, normal and abnormal responses to exercise, and how to exercise safely.   Angina -Discuss definition of angina, causes of angina, treatment of angina, and how to decrease risk of having angina.   Cardiac Medications -Review what the following cardiac medications are used for, how they affect the body, and side effects that may occur when taking the medications.  Medications include Aspirin, Beta blockers, calcium channel blockers, ACE Inhibitors, angiotensin receptor blockers, diuretics, digoxin, and antihyperlipidemics.   Congestive Heart Failure -Discuss the definition of CHF, how to live with CHF, the signs and symptoms of CHF, and how keep track of weight and sodium intake.   Heart Disease and Intimacy -Discus the effect sexual activity has on the heart, how changes occur during intimacy as we age, and safety during sexual activity.   Smoking Cessation / COPD -Discuss different methods to quit smoking, the health benefits of quitting smoking, and the definition of COPD.   Nutrition I: Fats -Discuss the types of cholesterol, what cholesterol does to the heart, and how cholesterol levels can be controlled.   Nutrition II: Labels -Discuss the different components of food labels and how to read food  label   Heart  Parts/Heart Disease and PAD -Discuss the anatomy of the heart, the pathway of blood circulation through the heart, and these are affected by heart disease.   Stress I: Signs and Symptoms -Discuss the causes of stress, how stress may lead to anxiety and depression, and ways to limit stress.   Stress II: Relaxation -Discuss different types of relaxation techniques to limit stress.   Warning Signs of Stroke / TIA -Discuss definition of a stroke, what the signs and symptoms are of a stroke, and how to identify when someone is having stroke.   Knowledge Questionnaire Score:  Knowledge Questionnaire Score - 01/22/24 1542       Knowledge Questionnaire Score   Pre Score 22/24             Core Components/Risk Factors/Patient Goals at Admission:  Personal Goals and Risk Factors at Admission - 01/22/24 1411       Core Components/Risk Factors/Patient Goals on Admission    Weight Management Yes;Weight Loss;Obesity    Intervention Weight Management: Develop a combined nutrition and exercise program designed to reach desired caloric intake, while maintaining appropriate intake of nutrient and fiber, sodium and fats, and appropriate energy expenditure required for the weight goal.;Weight Management: Provide education and appropriate resources to help participant work on and attain dietary goals.;Weight Management/Obesity: Establish reasonable short term and long term weight goals.;Obesity: Provide education and appropriate resources to help participant work on and attain dietary goals.    Admit Weight 202 lb 2.6 oz (91.7 kg)    Goal Weight: Long Term 200 lb (90.7 kg)   pt goal   Expected Outcomes Short Term: Continue to assess and modify interventions until short term weight is achieved;Weight Loss: Understanding of general recommendations for a balanced deficit meal plan, which promotes 1-2 lb weight loss per week and includes a negative energy balance of [phone] kcal/d;Understanding  recommendations for meals to include 15-35% energy as protein, 25-35% energy from fat, 35-60% energy from carbohydrates, less than 200mg  of dietary cholesterol, 20-35 gm of total fiber daily;Understanding of distribution of calorie intake throughout the day with the consumption of 4-5 meals/snacks;Long Term: Adherence to nutrition and physical activity/exercise program aimed toward attainment of established weight goal    Hypertension Yes    Intervention Provide education on lifestyle modifcations including regular physical activity/exercise, weight management, moderate sodium restriction and increased consumption of fresh fruit, vegetables, and low fat dairy, alcohol moderation, and smoking cessation.;Monitor prescription use compliance.    Expected Outcomes Long Term: Maintenance of blood pressure at goal levels.;Short Term: Continued assessment and intervention until BP is < 140/66mm HG in hypertensive participants. < 130/96mm HG in hypertensive participants with diabetes, heart failure or chronic kidney disease.    Lipids Yes    Intervention Provide education and support for participant on nutrition & aerobic/resistive exercise along with prescribed medications to achieve LDL 70mg , HDL >40mg .    Expected Outcomes Short Term: Participant states understanding of desired cholesterol values and is compliant with medications prescribed. Participant is following exercise prescription and nutrition guidelines.;Long Term: Cholesterol controlled with medications as prescribed, with individualized exercise RX and with personalized nutrition plan. Value goals: LDL < 70mg , HDL > 40 mg.             Core Components/Risk Factors/Patient Goals Review:    Core Components/Risk Factors/Patient Goals at Discharge (Final Review):    ITP Comments:  ITP Comments     Row Name 01/22/24 1406 02/08/24 1432  ITP Comments Dr. Armanda Magic medical director. Introduction to [facility] education/intensive  cardiac rehab. Initial orientation packet reviewed with patient. 30 Day ITP Review.    Patient has been cleared to return to cardiac rehab per Dr Kirke Corin. Terry Vega plans to return on 02/19/24 to begin exercise. Terry Vega is starting his new dose of isosorbide and wants to given the medication time to take effect.               Comments: See ITP comments.Thayer Headings RN BSN

## 2024-02-09 ENCOUNTER — Encounter (HOSPITAL_COMMUNITY)

## 2024-02-12 ENCOUNTER — Encounter (HOSPITAL_COMMUNITY)

## 2024-02-14 ENCOUNTER — Encounter (HOSPITAL_COMMUNITY)

## 2024-02-15 ENCOUNTER — Ambulatory Visit: Admitting: Cardiology

## 2024-02-16 ENCOUNTER — Encounter (HOSPITAL_COMMUNITY)

## 2024-02-19 ENCOUNTER — Encounter (HOSPITAL_COMMUNITY)
Admission: RE | Admit: 2024-02-19 | Discharge: 2024-02-19 | Disposition: A | Discharge status: Home / Self Care | Source: Ambulatory Visit | Attending: Cardiovascular Disease | Admitting: Cardiovascular Disease

## 2024-02-19 DIAGNOSIS — Z951 Presence of aortocoronary bypass graft: Secondary | ICD-10-CM | POA: Insufficient documentation

## 2024-02-19 NOTE — Progress Notes (Signed)
 Daily Session Note  Patient Details  Name: Terry Vega MRN: [id number] Date of Birth: [birth year] Referring Provider:   Flowsheet Row INTENSIVE CARDIAC REHAB ORIENT from 01/22/2024 in [facility]  Referring Provider Lorine Bears, MD       Encounter Date: 02/19/2024  Check In:  Session Check In - 02/19/24 1517       Check-In   Supervising physician immediately available to respond to emergencies [facility] MD immediately available    Physician(s) Rise Paganini, NP    Location [facility]    Staff Present Lorin Picket, MS, ACSM-CEP, CCRP, Exercise Physiologist;Olinty Peggye Pitt, MS, ACSM-CEP, Exercise Physiologist;Johnny Hale Bogus, MS, Exercise Physiologist;Jetta Dan Humphreys BS, ACSM-CEP, Exercise Physiologist;Teejay Meader, RN, BSN    Virtual Visit No    Medication changes reported     Yes    Comments Started Isosorbide 60 mg daily    Fall or balance concerns reported    No    Tobacco Cessation No Change    Warm-up and Cool-down Performed as group-led instruction    Resistance Training Performed Yes    VAD Patient? No    PAD/SET Patient? No      Pain Assessment   Currently in Pain? No/denies    Pain Score 0-No pain    Multiple Pain Sites No             Capillary Blood Glucose: No results found for this or any previous visit (from the past 24 hours).   Exercise Prescription Changes - 02/19/24 1648       Response to Exercise   Blood Pressure (Admit) 130/78    Blood Pressure (Exercise) 130/70    Blood Pressure (Exit) 120/62    Heart Rate (Admit) 65 bpm    Heart Rate (Exercise) 78 bpm    Heart Rate (Exit) 74 bpm    Rating of Perceived Exertion (Exercise) 11    Perceived Dyspnea (Exercise) 0    Symptoms none    Comments Pt first day in the [facility] program.    Duration Progress to 30 minutes of  aerobic without signs/symptoms of physical distress    Intensity THRR unchanged       Progression   Progression Continue to progress workloads to maintain intensity without signs/symptoms of physical distress.    Average METs 1.8      Resistance Training   Training Prescription Yes    Weight 3    Reps 10-15    Time 10 Minutes      NuStep   Level 2    SPM 66    Minutes 15    METs 1.8             Social History   Tobacco Use  Smoking Status Never  Smokeless Tobacco Never    Goals Met:  Exercise tolerated well No report of concerns or symptoms today Strength training completed today  Goals Unmet:  Not Applicable  Comments: Terry Vega  started cardiac rehab today.  Pt tolerated light exercise without difficulty. VSS, telemetry-Sinus Rhythm, asymptomatic.  Medication list reconciled. Pt denies barriers to medicaiton compliance.  PSYCHOSOCIAL ASSESSMENT:  PHQ-3. Terry Vega says that he still has some chest tightness when after walking a 1/4 of a mile at home. Terry Vega says the tightness has improved since his Imdur dose was increased. Terry Vega had no reports of angina during exercise today at cardiac rehab.   Pt enjoys church, spending time with his children and great grandchildren,  spending time [place], friends and yard work .   Pt oriented to exercise equipment and routine.    Understanding verbalized.Thayer Headings RN BSN    Dr. Armanda Magic is Medical Director for Cardiac Rehab at [facility].

## 2024-02-21 ENCOUNTER — Encounter (HOSPITAL_COMMUNITY)
Admission: RE | Admit: 2024-02-21 | Discharge: 2024-02-21 | Disposition: A | Discharge status: Home / Self Care | Source: Ambulatory Visit | Attending: Cardiovascular Disease | Admitting: Cardiovascular Disease

## 2024-02-21 DIAGNOSIS — Z951 Presence of aortocoronary bypass graft: Secondary | ICD-10-CM | POA: Diagnosis not present

## 2024-02-21 MED ORDER — MIDAZOLAM HCL (PF) 10 MG/2ML IJ SOLN
INTRAMUSCULAR | Status: AC
  Filled 2024-02-21: qty 2

## 2024-02-21 MED ORDER — FENTANYL CITRATE (PF) 250 MCG/5ML IJ SOLN
INTRAMUSCULAR | Status: AC
  Filled 2024-02-21: qty 5

## 2024-02-21 MED ORDER — PROPOFOL 10 MG/ML IV BOLUS
INTRAVENOUS | Status: AC
Start: 2024-02-21 — End: ?
  Filled 2024-02-21: qty 20

## 2024-02-21 MED ORDER — FENTANYL CITRATE (PF) 250 MCG/5ML IJ SOLN
INTRAMUSCULAR | Status: AC
Start: 2024-02-21 — End: ?
  Filled 2024-02-21: qty 5

## 2024-02-22 ENCOUNTER — Ambulatory Visit: Admitting: Student

## 2024-02-23 ENCOUNTER — Encounter (HOSPITAL_COMMUNITY)

## 2024-02-26 ENCOUNTER — Encounter (HOSPITAL_COMMUNITY)
Admission: RE | Admit: 2024-02-26 | Discharge: 2024-02-26 | Disposition: A | Discharge status: Home / Self Care | Source: Ambulatory Visit | Attending: Cardiovascular Disease

## 2024-02-26 DIAGNOSIS — Z951 Presence of aortocoronary bypass graft: Secondary | ICD-10-CM | POA: Diagnosis not present

## 2024-02-28 ENCOUNTER — Encounter (HOSPITAL_COMMUNITY)
Admission: RE | Admit: 2024-02-28 | Discharge: 2024-02-28 | Disposition: A | Discharge status: Home / Self Care | Source: Ambulatory Visit | Attending: Cardiovascular Disease | Admitting: Cardiovascular Disease

## 2024-02-28 DIAGNOSIS — Z951 Presence of aortocoronary bypass graft: Secondary | ICD-10-CM

## 2024-03-01 ENCOUNTER — Encounter (HOSPITAL_COMMUNITY)

## 2024-03-04 ENCOUNTER — Encounter (HOSPITAL_COMMUNITY)
Admission: RE | Admit: 2024-03-04 | Discharge: 2024-03-04 | Disposition: A | Discharge status: Home / Self Care | Source: Ambulatory Visit | Attending: Cardiovascular Disease | Admitting: Cardiovascular Disease

## 2024-03-04 DIAGNOSIS — Z951 Presence of aortocoronary bypass graft: Secondary | ICD-10-CM | POA: Diagnosis not present

## 2024-03-05 NOTE — Progress Notes (Signed)
 Cardiac Individual Treatment Plan  Patient Details  Name: Terry Vega MRN: [id number] Date of Birth: 02-23-1946 Referring Provider:   Flowsheet Row INTENSIVE CARDIAC REHAB ORIENT from 01/22/2024 in [facility]  Referring Provider Antionette Kirks, MD       Initial Encounter Date:  Flowsheet Row INTENSIVE CARDIAC REHAB ORIENT from 01/22/2024 in [facility]  Date 01/22/24       Visit Diagnosis: 12/20/23 CABG x 5  Patient's Home Medications on Admission:  Current Outpatient Medications:    aspirin  EC 81 MG tablet, Take 1 tablet (81 mg total) by mouth daily. Swallow whole., Disp: , Rfl:    atorvastatin  (LIPITOR) 40 MG tablet, Take 1 tablet (40 mg total) by mouth daily., Disp: 30 tablet, Rfl: 6   carvedilol  (COREG ) 6.25 MG tablet, Take 1 tablet (6.25 mg total) by mouth 2 (two) times daily., Disp: 180 tablet, Rfl: 3   cholecalciferol (VITAMIN D ) 1000 UNITS tablet, Take 1,000 Units by mouth daily., Disp: , Rfl:    clopidogrel  (PLAVIX ) 75 MG tablet, Take 1 tablet (75 mg total) by mouth daily., Disp: 90 tablet, Rfl: 3   isosorbide  mononitrate (IMDUR ) 60 MG 24 hr tablet, Take 1 tablet (60 mg total) by mouth daily., Disp: 90 tablet, Rfl: 1   levothyroxine  (SYNTHROID ) 100 MCG tablet, Take 1 tablet (100 mcg total) by mouth daily., Disp: 90 tablet, Rfl: 3   Multiple Vitamin (MULTIVITAMIN) tablet, Take 1 tablet by mouth daily., Disp: , Rfl:    nitroGLYCERIN  (NITROSTAT ) 0.4 MG SL tablet, Place 0.4 mg under the tongue every 5 (five) minutes as needed for chest pain., Disp: , Rfl:    PREVIDENT 5000 BOOSTER PLUS 1.1 % PSTE, Place 1 application  onto teeth at bedtime., Disp: , Rfl:    triamcinolone  cream (KENALOG ) 0.1 %, Apply 1 Application topically daily as needed (irritation)., Disp: , Rfl:    vitamin E 1000 UNIT capsule, Take 1,000 Units by mouth daily., Disp: , Rfl:   Past Medical  History: Past Medical History:  Diagnosis Date   Allergy    Arthritis    [employer] 07/21/2020   CAD (coronary artery disease)    a. 11/2023 Cath: LM 42m/d, LAD 70p/m, D1 99, D2 80, LCX 70ost, 46m/d, OM2 90, RCA 40p/m, 90d, EF 55-65%; b. 12/2023 CABG x 5: LIMA->LAD, VG->D1, VG->D2, VG->OM, VG->dRCA.   Diastolic dysfunction    a. 07/2021 Echo: EF 60-65%.  Mild LVH.  Grade 1 DD.  Normal RV fxn. mildly dil LA. Triv MR/AI. Mild AoV sclerosis.   Heart murmur    Hypertension    Hypothyroidism    PVC's (premature ventricular contractions)     Tobacco Use: Social History   Tobacco Use  Smoking Status Never  Smokeless Tobacco Never    Labs: Review Flowsheet  More data exists      Latest Ref Rng & Units 07/28/2021 08/01/2022 08/04/2023 12/18/2023 12/19/2023  Labs for ITP Cardiac and Pulmonary Rehab  Cholestrol 0 - 200 mg/dL 409  811  914  - -  LDL (calc) 0 - 99 mg/dL 782  956  91  - -  HDL-C >39.00 mg/dL 21.30  86.57  84.69  - -  Trlycerides 0.0 - 149.0 mg/dL 62.9  528.4  13.2  - -  Hemoglobin A1c 4.8 - 5.6 % 5.3  - - 5.2  -  PH, Arterial 7.35 - 7.45 - - - 7.45  7.340  7.336  7.406  7.431  7.408  7.402  7.358  7.382   PCO2 arterial 32 - 48 mmHg - - - 43  44.8  45.6  38.2  37.8  40.3  42.0  48.2  43.9   Bicarbonate 20.0 - 28.0 mmol/L - - - 29.9  24.0  24.2  24.3  25.1  25.5  26.1  26.2  27.1  26.1   TCO2 22 - 32 mmol/L - - - - 25  26  26  26  27  27  25  27  27  28  29  26  27  29    Acid-base deficit 0.0 - 2.0 mmol/L - - - - 2.0  2.0  1.0   O2 Saturation % - - - 97.2  98  98  93  100  100  100  84  100  100     Details       Multiple values from one day are sorted in reverse-chronological order         Capillary Blood Glucose: Lab Results  Component Value Date   GLUCAP 141 (H) 12/21/2023   GLUCAP 96 12/21/2023   GLUCAP 115 (H) 12/20/2023   GLUCAP 142 (H) 12/20/2023   GLUCAP 115 (H) 12/20/2023     Exercise Target Goals: Exercise Program Goal: Individual exercise prescription  set using results from initial 6 min walk test and THRR while considering  patient's activity barriers and safety.   Exercise Prescription Goal: Initial exercise prescription builds to 30-45 minutes a day of aerobic activity, 2-3 days per week.  Home exercise guidelines will be given to patient during program as part of exercise prescription that the participant will acknowledge.  Activity Barriers & Risk Stratification:  Activity Barriers & Cardiac Risk Stratification - 01/22/24 1409       Activity Barriers & Cardiac Risk Stratification   Activity Barriers Incisional Pain;Balance Concerns;Joint Problems    Cardiac Risk Stratification High   <5 METs on            6 Minute Walk:  6 Minute Walk     Row Name 01/22/24 1539         6 Minute Walk   Phase Initial     Distance 1420 feet     Walk Time 6 minutes     # of Rest Breaks 0     MPH 2.69     METS 2.6     RPE 11     Perceived Dyspnea  0     VO2 Peak 9.09     Symptoms No     Resting HR 75 bpm     Resting BP 106/62     Resting Oxygen Saturation  95 %     Exercise Oxygen Saturation  during 6 min walk 93 %     Max Ex. HR 108 bpm     Max Ex. BP 142/64     2 Minute Post BP 112/68              Oxygen Initial Assessment:   Oxygen Re-Evaluation:   Oxygen Discharge (Final Oxygen Re-Evaluation):   Initial Exercise Prescription:  Initial Exercise Prescription - 01/22/24 1500       Date of Initial Exercise RX and Referring Provider   Date 01/22/24    Referring Provider Antionette Kirks, MD    Expected Discharge Date 04/17/24      Recumbant Bike   Level 1  RPM 50    Watts 30    Minutes 15    METs 2      NuStep   Level 2    SPM 75    Minutes 15    METs 2.3      Prescription Details   Frequency (times per week) 3    Duration Progress to 30 minutes of continuous aerobic without signs/symptoms of physical distress      Intensity   THRR 40-80% of Max Heartrate 56-113    Ratings of Perceived  Exertion 11-13    Perceived Dyspnea 0-4      Progression   Progression Continue progressive overload as per policy without signs/symptoms or physical distress.      Resistance Training   Training Prescription Yes    Weight 3    Reps 10-15             Perform Capillary Blood Glucose checks as needed.  Exercise Prescription Changes:   Exercise Prescription Changes     Row Name 02/19/24 1648             Response to Exercise   Blood Pressure (Admit) 130/78       Blood Pressure (Exercise) 130/70       Blood Pressure (Exit) 120/62       Heart Rate (Admit) 65 bpm       Heart Rate (Exercise) 78 bpm       Heart Rate (Exit) 74 bpm       Rating of Perceived Exertion (Exercise) 11       Perceived Dyspnea (Exercise) 0       Symptoms none       Comments Pt first day in the [facility] program.       Duration Progress to 30 minutes of  aerobic without signs/symptoms of physical distress       Intensity THRR unchanged         Progression   Progression Continue to progress workloads to maintain intensity without signs/symptoms of physical distress.       Average METs 1.8         Resistance Training   Training Prescription Yes       Weight 3       Reps 10-15       Time 10 Minutes         NuStep   Level 2       SPM 66       Minutes 15       METs 1.8                Exercise Comments:   Exercise Comments     Row Name 02/19/24 1653           Exercise Comments Pt first day in the [facility] program. Pt tolerated exercise well with an average MET level of 1.8. Pt is off to a good start and is leanring his THRR, RPE and ExRx                Exercise Goals and Review:   Exercise Goals     Row Name 01/22/24 1409             Exercise Goals   Increase Physical Activity Yes       Intervention Provide advice, education, support and counseling about physical activity/exercise needs.;Develop an individualized exercise prescription for aerobic and  resistive training based on initial evaluation findings, risk stratification, comorbidities and participant's personal  goals.       Expected Outcomes Short Term: Attend rehab on a regular basis to increase amount of physical activity.;Long Term: Exercising regularly at least 3-5 days a week.;Long Term: Add in home exercise to make exercise part of routine and to increase amount of physical activity.       Increase Strength and Stamina Yes       Intervention Provide advice, education, support and counseling about physical activity/exercise needs.;Develop an individualized exercise prescription for aerobic and resistive training based on initial evaluation findings, risk stratification, comorbidities and participant's personal goals.       Expected Outcomes Short Term: Increase workloads from initial exercise prescription for resistance, speed, and METs.;Short Term: Perform resistance training exercises routinely during rehab and add in resistance training at home;Long Term: Improve cardiorespiratory fitness, muscular endurance and strength as measured by increased METs and functional capacity ( )       Able to understand and use rate of perceived exertion (RPE) scale Yes       Intervention Provide education and explanation on how to use RPE scale       Expected Outcomes Short Term: Able to use RPE daily in rehab to express subjective intensity level;Long Term:  Able to use RPE to guide intensity level when exercising independently       Knowledge and understanding of Target Heart Rate Range (THRR) Yes       Intervention Provide education and explanation of THRR including how the numbers were predicted and where they are located for reference       Expected Outcomes Short Term: Able to state/look up THRR;Short Term: Able to use daily as guideline for intensity in rehab;Long Term: Able to use THRR to govern intensity when exercising independently       Understanding of Exercise Prescription Yes        Intervention Provide education, explanation, and written materials on patient's individual exercise prescription       Expected Outcomes Short Term: Able to explain program exercise prescription;Long Term: Able to explain home exercise prescription to exercise independently                Exercise Goals Re-Evaluation :  Exercise Goals Re-Evaluation     Row Name 02/19/24 1651             Exercise Goal Re-Evaluation   Exercise Goals Review Increase Physical Activity;Understanding of Exercise Prescription;Increase Strength and Stamina;Knowledge and understanding of Target Heart Rate Range (THRR);Able to understand and use rate of perceived exertion (RPE) scale       Comments Pt first day in the [facility] program. Pt tolerated exercise well with an average MET level of 1.8. Pt is off to a good start and is leanring his THRR, RPE and ExRx       Expected Outcomes Will continue to monitor pt and progress workloads as tolerated without sign or symptom                Discharge Exercise Prescription (Final Exercise Prescription Changes):  Exercise Prescription Changes - 02/19/24 1648       Response to Exercise   Blood Pressure (Admit) 130/78    Blood Pressure (Exercise) 130/70    Blood Pressure (Exit) 120/62    Heart Rate (Admit) 65 bpm    Heart Rate (Exercise) 78 bpm    Heart Rate (Exit) 74 bpm    Rating of Perceived Exertion (Exercise) 11    Perceived Dyspnea (Exercise) 0    Symptoms  none    Comments Pt first day in the [employer] program.    Duration Progress to 30 minutes of  aerobic without signs/symptoms of physical distress    Intensity THRR unchanged      Progression   Progression Continue to progress workloads to maintain intensity without signs/symptoms of physical distress.    Average METs 1.8      Resistance Training   Training Prescription Yes    Weight 3    Reps 10-15    Time 10 Minutes      NuStep   Level 2    SPM 66    Minutes 15    METs 1.8              Nutrition:  Target Goals: Understanding of nutrition guidelines, daily intake of sodium 1500mg , cholesterol 200mg , calories 30% from fat and 7% or less from saturated fats, daily to have 5 or more servings of fruits and vegetables.  Biometrics:  Pre Biometrics - 01/22/24 1407       Pre Biometrics   Waist Circumference 42.5 inches    Hip Circumference 43.5 inches    Waist to Hip Ratio 0.98 %    Triceps Skinfold 23 mm    % Body Fat 32.4 %    Grip Strength 36 kg    Flexibility 11 in    Single Leg Stand 1.25 seconds              Nutrition Therapy Plan and Nutrition Goals:  Nutrition Therapy & Goals - 02/20/24 1148       Nutrition Therapy   Diet Heart Healthy Diet    Drug/Food Interactions Statins/Certain Fruits      Personal Nutrition Goals   Nutrition Goal Patient to identify strategies for reducing cardiovascular risk by attending the [facility] education and nutrition series weekly.    Personal Goal #2 Patient to improve diet quality by using the plate method as a guide for meal planning to include lean protein/plant protein, fruits, vegetables, whole grains, nonfat dairy as part of a well-balanced diet.    Comments Terry Vega has medical history of CAD, CABGx5, HTN, hyperlipidemia. He is already started making many heart healthy dietary changes. His wife is an excellent support. He is motivated to lose weight and is down 6# since orientation to our program. LDL is not at goal of <55. Patient will benefit from participation in intensive cardiac rehab for nutrition, exercise, and lifestyle modification.      Intervention Plan   Intervention Prescribe, educate and counsel regarding individualized specific dietary modifications aiming towards targeted core components such as weight, hypertension, lipid management, diabetes, heart failure and other comorbidities.;Nutrition handout(s) given to patient.    Expected Outcomes Short Term Goal: Understand basic principles of  dietary content, such as calories, fat, sodium, cholesterol and nutrients.;Long Term Goal: Adherence to prescribed nutrition plan.             Nutrition Assessments:  Nutrition Assessments - 02/21/24 1630       Rate Your Plate Scores   Pre Score 56            MEDIFICTS Score Key: >=70 Need to make dietary changes  40-70 Heart Healthy Diet <= 40 Therapeutic Level Cholesterol Diet   Flowsheet Row INTENSIVE CARDIAC REHAB from 02/21/2024 in [facility]  Picture Your Plate Total Score on Admission 56      Picture Your Plate Scores: <16 Unhealthy dietary pattern  with much room for improvement. 41-50 Dietary pattern unlikely to meet recommendations for good health and room for improvement. 51-60 More healthful dietary pattern, with some room for improvement.  >60 Healthy dietary pattern, although there may be some specific behaviors that could be improved.    Nutrition Goals Re-Evaluation:  Nutrition Goals Re-Evaluation     Row Name 02/20/24 1148             Goals   Current Weight 195 lb 15.8 oz (88.9 kg)       Comment A1c WNL, Lpa 175.9kg, lipids WNL, LDl 91       Expected Outcome Terry Vega has medical history of CAD, CABGx5, HTN, hyperlipidemia. He is already started making many heart healthy dietary changes. His wife is an excellent support. He is motivated to lose weight and is down 6# since orientation to our program. LDL is not at goal of <55. Patient will benefit from participation in intensive cardiac rehab for nutrition, exercise, and lifestyle modification.                Nutrition Goals Re-Evaluation:  Nutrition Goals Re-Evaluation     Row Name 02/20/24 1148             Goals   Current Weight 195 lb 15.8 oz (88.9 kg)       Comment A1c WNL, Lpa 175.9kg, lipids WNL, LDl 91       Expected Outcome Terry Vega has medical history of CAD, CABGx5, HTN, hyperlipidemia. He is already started making many heart  healthy dietary changes. His wife is an excellent support. He is motivated to lose weight and is down 6# since orientation to our program. LDL is not at goal of <55. Patient will benefit from participation in intensive cardiac rehab for nutrition, exercise, and lifestyle modification.                Nutrition Goals Discharge (Final Nutrition Goals Re-Evaluation):  Nutrition Goals Re-Evaluation - 02/20/24 1148       Goals   Current Weight 195 lb 15.8 oz (88.9 kg)    Comment A1c WNL, Lpa 175.9kg, lipids WNL, LDl 91    Expected Outcome Terry Vega has medical history of CAD, CABGx5, HTN, hyperlipidemia. He is already started making many heart healthy dietary changes. His wife is an excellent support. He is motivated to lose weight and is down 6# since orientation to our program. LDL is not at goal of <55. Patient will benefit from participation in intensive cardiac rehab for nutrition, exercise, and lifestyle modification.             Psychosocial: Target Goals: Acknowledge presence or absence of significant depression and/or stress, maximize coping skills, provide positive support system. Participant is able to verbalize types and ability to use techniques and skills needed for reducing stress and depression.  Initial Review & Psychosocial Screening:  Initial Psych Review & Screening - 01/22/24 1409       Initial Review   Current issues with None Identified      Family Dynamics   Good Support System? Yes   Wife for support   Comments Terry Vega denies any feelings of anxiety/stress/depression. He did share that he is waking up every 2 hours at night to use the restroom, he said it has become less frequent the past few weeks and he has no trouble falling back asleep. Denies any need for additional resources at this time.      Barriers   Psychosocial barriers to participate in  program There are no identifiable barriers or psychosocial needs.      Screening Interventions   Interventions  Encouraged to exercise;Provide feedback about the scores to participant    Expected Outcomes Short Term goal: Identification and review with participant of any Quality of Life or Depression concerns found by scoring the questionnaire.;Long Term goal: The participant improves quality of Life and PHQ9 Scores as seen by post scores and/or verbalization of changes             Quality of Life Scores:  Quality of Life - 01/22/24 1542       Quality of Life   Select Quality of Life      Quality of Life Scores   Health/Function Pre 26.2 %    Socioeconomic Pre 28.21 %    Psych/Spiritual Pre 27.21 %    Family Pre 28.8 %    GLOBAL Pre 27.21 %            Scores of 19 and below usually indicate a poorer quality of life in these areas.  A difference of  2-3 points is a clinically meaningful difference.  A difference of 2-3 points in the total score of the Quality of Life Index has been associated with significant improvement in overall quality of life, self-image, physical symptoms, and general health in studies assessing change in quality of life.  PHQ-9: Review Flowsheet  More data exists      01/22/2024 08/04/2023 05/31/2023 09/01/2022 07/28/2021  Depression screen PHQ 2/9  Decreased Interest 0 0 0 0 0  Down, Depressed, Hopeless 0 0 0 0 0  PHQ - 2 Score 0 0 0 0 0  Altered sleeping 1 0 0 - 0  Tired, decreased energy 1 1 0 - 0  Change in appetite 1 0 0 - 0  Feeling bad or failure about yourself  0 0 0 - 0  Trouble concentrating 0 0 0 - 0  Moving slowly or fidgety/restless 0 0 0 - 0  Suicidal thoughts 0 0 0 - 0  PHQ-9 Score 3 1 0 - 0  Difficult doing work/chores Not difficult at all Not difficult at all Not difficult at all - -   Interpretation of Total Score  Total Score Depression Severity:  1-4 = Minimal depression, 5-9 = Mild depression, 10-14 = Moderate depression, 15-19 = Moderately severe depression, 20-27 = Severe depression   Psychosocial Evaluation and  Intervention:   Psychosocial Re-Evaluation:  Psychosocial Re-Evaluation     Row Name 02/20/24 0948 03/05/24 1712           Psychosocial Re-Evaluation   Current issues with Current Stress Concerns Current Stress Concerns      Comments Terry Vega has stress concerns regarding his health and continued angina. Will discuss PH9 in the upcoming week Reviewed  PH9 Terry Vega says that he does not feel as depressed. Terry Vega also reports that he is sleeping better. Terry Vega says his energy level has also improved.      Expected Outcomes Terry Vega will have controlled or decreased stress upon compleiton of cardiac rehab Terry Vega will have controlled or decreased stress upon compleiton of cardiac rehab      Interventions Encouraged to attend Cardiac Rehabilitation for the exercise;Stress management education;Relaxation education Encouraged to attend Cardiac Rehabilitation for the exercise;Stress management education;Relaxation education      Continue Psychosocial Services  Follow up required by staff No Follow up required        Initial Review   Source of Stress Concerns Chronic  Illness Chronic Illness      Comments Will continiue to monitor and offer support as needed. Will continiue to monitor and offer support as needed.               Psychosocial Discharge (Final Psychosocial Re-Evaluation):  Psychosocial Re-Evaluation - 03/05/24 1712       Psychosocial Re-Evaluation   Current issues with Current Stress Concerns    Comments Reviewed  PH9 Terry Vega says that he does not feel as depressed. Terry Vega also reports that he is sleeping better. Terry Vega says his energy level has also improved.    Expected Outcomes Terry Vega will have controlled or decreased stress upon compleiton of cardiac rehab    Interventions Encouraged to attend Cardiac Rehabilitation for the exercise;Stress management education;Relaxation education    Continue Psychosocial Services  No Follow up required      Initial Review   Source of Stress Concerns Chronic  Illness    Comments Will continiue to monitor and offer support as needed.             Vocational Rehabilitation: Provide vocational rehab assistance to qualifying candidates.   Vocational Rehab Evaluation & Intervention:  Vocational Rehab - 01/22/24 1542       Initial Vocational Rehab Evaluation & Intervention   Assessment shows need for Vocational Rehabilitation No   Terry Vega is retired            Education: Education Goals: Education classes will be provided on a weekly basis, covering required topics. Participant will state understanding/return demonstration of topics presented.    Education     Row Name 02/19/24 1500     Education   Cardiac Education Topics [facility]   Glass blower/designer Nutrition   Nutrition Workshop Fueling a Forensic psychologist   Instruction Review Code 1- Teaching laboratory technician Start Time 1400   Class Stop Time 1440   Class Time Calculation (min) 40 min    Row Name 02/21/24 1500     Education   Cardiac Education Topics [facility]   Customer service manager   Weekly Topic International Cuisine- Spotlight on the [employer] Zones   Instruction Review Code 1- Verbalizes Understanding   Class Start Time 1400   Class Stop Time 1440   Class Time Calculation (min) 40 min    Row Name 02/26/24 1700     Education   Cardiac Education Topics [facility]   [employer]     Workshops   Educator Exercise Physiologist   Select Psychosocial   Psychosocial Workshop Healthy Sleep for a Healthy Heart   Instruction Review Code 1- Verbalizes Understanding   Class Start Time 1403   Class Stop Time 1457   Class Time Calculation (min) 54 min    Row Name 02/28/24 1500     Education   Cardiac Education Topics [facility]   Customer service manager   Weekly Topic Simple Sides and Sauces   Instruction Review Code 1- Verbalizes  Understanding   Class Start Time 1355   Class Stop Time 1431   Class Time Calculation (min) 36 min    Row Name 03/04/24 1500     Education   Cardiac Education Topics [facility]   Geographical information systems officer Exercise   Exercise Workshop Managing Heart  Disease: Your Path to a Healthier Heart   Instruction Review Code 1- Verbalizes Understanding   Class Start Time 1400   Class Stop Time 1448   Class Time Calculation (min) 48 min            Core Videos: Exercise    Move It!  Clinical staff conducted group or individual video education with verbal and written material and guidebook.  Patient learns the recommended [facility] exercise program. Exercise with the goal of living a long, healthy life. Some of the health benefits of exercise include controlled diabetes, healthier blood pressure levels, improved cholesterol levels, improved heart and lung capacity, improved sleep, and better body composition. Everyone should speak with their doctor before starting or changing an exercise routine.  Biomechanical Limitations Clinical staff conducted group or individual video education with verbal and written material and guidebook.  Patient learns how biomechanical limitations can impact exercise and how we can mitigate and possibly overcome limitations to have an impactful and balanced exercise routine.  Body Composition Clinical staff conducted group or individual video education with verbal and written material and guidebook.  Patient learns that body composition (ratio of muscle mass to fat mass) is a key component to assessing overall fitness, rather than body weight alone. Increased fat mass, especially visceral belly fat, can put us  at increased risk for metabolic syndrome, type 2 diabetes, heart disease, and even death. It is recommended to combine diet and exercise (cardiovascular and resistance training) to improve your body composition. Seek  guidance from your physician and exercise physiologist before implementing an exercise routine.  Exercise Action Plan Clinical staff conducted group or individual video education with verbal and written material and guidebook.  Patient learns the recommended strategies to achieve and enjoy long-term exercise adherence, including variety, self-motivation, self-efficacy, and positive decision making. Benefits of exercise include fitness, good health, weight management, more energy, better sleep, less stress, and overall well-being.  Medical   Heart Disease Risk Reduction Clinical staff conducted group or individual video education with verbal and written material and guidebook.  Patient learns our heart is our most vital organ as it circulates oxygen, nutrients, white blood cells, and hormones throughout the entire body, and carries waste away. Data supports a plant-based eating plan like the Pritikin Program for its effectiveness in slowing progression of and reversing heart disease. The video provides a number of recommendations to address heart disease.   Metabolic Syndrome and Belly Fat  Clinical staff conducted group or individual video education with verbal and written material and guidebook.  Patient learns what metabolic syndrome is, how it leads to heart disease, and how one can reverse it and keep it from coming back. You have metabolic syndrome if you have 3 of the following 5 criteria: abdominal obesity, high blood pressure, high triglycerides, low HDL cholesterol, and high blood sugar.  Hypertension and Heart Disease Clinical staff conducted group or individual video education with verbal and written material and guidebook.  Patient learns that high blood pressure, or hypertension, is very common in the [place] . Hypertension is largely due to excessive salt intake, but other important risk factors include being overweight, physical inactivity, drinking too much alcohol, smoking, and  not eating enough potassium from fruits and vegetables. High blood pressure is a leading risk factor for heart attack, stroke, congestive heart failure, dementia, kidney failure, and premature death. Long-term effects of excessive salt intake include stiffening of the arteries and thickening of heart muscle and organ damage. Recommendations include ways  to reduce hypertension and the risk of heart disease.  Diseases of Our Time - Focusing on Diabetes Clinical staff conducted group or individual video education with verbal and written material and guidebook.  Patient learns why the best way to stop diseases of our time is prevention, through food and other lifestyle changes. Medicine (such as prescription pills and surgeries) is often only a Band-Aid on the problem, not a long-term solution. Most common diseases of our time include obesity, type 2 diabetes, hypertension, heart disease, and cancer. The Pritikin Program is recommended and has been proven to help reduce, reverse, and/or prevent the damaging effects of metabolic syndrome.  Nutrition   Overview of the Pritikin Eating Plan  Clinical staff conducted group or individual video education with verbal and written material and guidebook.  Patient learns about the Pritikin Eating Plan for disease risk reduction. The Pritikin Eating Plan emphasizes a wide variety of unrefined, minimally-processed carbohydrates, like fruits, vegetables, whole grains, and legumes. Go, Caution, and Stop food choices are explained. Plant-based and lean animal proteins are emphasized. Rationale provided for low sodium intake for blood pressure control, low added sugars for blood sugar stabilization, and low added fats and oils for coronary artery disease risk reduction and weight management.  Calorie Density  Clinical staff conducted group or individual video education with verbal and written material and guidebook.  Patient learns about calorie density and how it impacts  the Pritikin Eating Plan. Knowing the characteristics of the food you choose will help you decide whether those foods will lead to weight gain or weight loss, and whether you want to consume more or less of them. Weight loss is usually a side effect of the Pritikin Eating Plan because of its focus on low calorie-dense foods.  Label Reading  Clinical staff conducted group or individual video education with verbal and written material and guidebook.  Patient learns about the Pritikin recommended label reading guidelines and corresponding recommendations regarding calorie density, added sugars, sodium content, and whole grains.  Dining Out - Part 1  Clinical staff conducted group or individual video education with verbal and written material and guidebook.  Patient learns that restaurant meals can be sabotaging because they can be so high in calories, fat, sodium, and/or sugar. Patient learns recommended strategies on how to positively address this and avoid unhealthy pitfalls.  Facts on Fats  Clinical staff conducted group or individual video education with verbal and written material and guidebook.  Patient learns that lifestyle modifications can be just as effective, if not more so, as many medications for lowering your risk of heart disease. A Pritikin lifestyle can help to reduce your risk of inflammation and atherosclerosis (cholesterol build-up, or plaque, in the artery walls). Lifestyle interventions such as dietary choices and physical activity address the cause of atherosclerosis. A review of the types of fats and their impact on blood cholesterol levels, along with dietary recommendations to reduce fat intake is also included.  Nutrition Action Plan  Clinical staff conducted group or individual video education with verbal and written material and guidebook.  Patient learns how to incorporate Pritikin recommendations into their lifestyle. Recommendations include planning and keeping personal  health goals in mind as an important part of their success.  Healthy Mind-Set    Healthy Minds, Bodies, Hearts  Clinical staff conducted group or individual video education with verbal and written material and guidebook.  Patient learns how to identify when they are stressed. Video will discuss the impact of that stress, as  well as the many benefits of stress management. Patient will also be introduced to stress management techniques. The way we think, act, and feel has an impact on our hearts.  How Our Thoughts Can Heal Our Hearts  Clinical staff conducted group or individual video education with verbal and written material and guidebook.  Patient learns that negative thoughts can cause depression and anxiety. This can result in negative lifestyle behavior and serious health problems. Cognitive behavioral therapy is an effective method to help control our thoughts in order to change and improve our emotional outlook.  Additional Videos:  Exercise    Improving Performance  Clinical staff conducted group or individual video education with verbal and written material and guidebook.  Patient learns to use a non-linear approach by alternating intensity levels and lengths of time spent exercising to help burn more calories and lose more body fat. Cardiovascular exercise helps improve heart health, metabolism, hormonal balance, blood sugar control, and recovery from fatigue. Resistance training improves strength, endurance, balance, coordination, reaction time, metabolism, and muscle mass. Flexibility exercise improves circulation, posture, and balance. Seek guidance from your physician and exercise physiologist before implementing an exercise routine and learn your capabilities and proper form for all exercise.  Introduction to Yoga  Clinical staff conducted group or individual video education with verbal and written material and guidebook.  Patient learns about yoga, a discipline of the coming  together of mind, breath, and body. The benefits of yoga include improved flexibility, improved range of motion, better posture and core strength, increased lung function, weight loss, and positive self-image. Yoga's heart health benefits include lowered blood pressure, healthier heart rate, decreased cholesterol and triglyceride levels, improved immune function, and reduced stress. Seek guidance from your physician and exercise physiologist before implementing an exercise routine and learn your capabilities and proper form for all exercise.  Medical   Aging: Enhancing Your Quality of Life  Clinical staff conducted group or individual video education with verbal and written material and guidebook.  Patient learns key strategies and recommendations to stay in good physical health and enhance quality of life, such as prevention strategies, having an advocate, securing a Health Care Proxy and Power of Attorney, and keeping a list of medications and system for tracking them. It also discusses how to avoid risk for bone loss.  Biology of Weight Control  Clinical staff conducted group or individual video education with verbal and written material and guidebook.  Patient learns that weight gain occurs because we consume more calories than we burn (eating more, moving less). Even if your body weight is normal, you may have higher ratios of fat compared to muscle mass. Too much body fat puts you at increased risk for cardiovascular disease, heart attack, stroke, type 2 diabetes, and obesity-related cancers. In addition to exercise, following the Pritikin Eating Plan can help reduce your risk.  Decoding Lab Results  Clinical staff conducted group or individual video education with verbal and written material and guidebook.  Patient learns that lab test reflects one measurement whose values change over time and are influenced by many factors, including medication, stress, sleep, exercise, food, hydration,  pre-existing medical conditions, and more. It is recommended to use the knowledge from this video to become more involved with your lab results and evaluate your numbers to speak with your doctor.   Diseases of Our Time - Overview  Clinical staff conducted group or individual video education with verbal and written material and guidebook.  Patient learns that according to  the CDC, 50% to 70% of chronic diseases (such as obesity, type 2 diabetes, elevated lipids, hypertension, and heart disease) are avoidable through lifestyle improvements including healthier food choices, listening to satiety cues, and increased physical activity.  Sleep Disorders Clinical staff conducted group or individual video education with verbal and written material and guidebook.  Patient learns how good quality and duration of sleep are important to overall health and well-being. Patient also learns about sleep disorders and how they impact health along with recommendations to address them, including discussing with a physician.  Nutrition  Dining Out - Part 2 Clinical staff conducted group or individual video education with verbal and written material and guidebook.  Patient learns how to plan ahead and communicate in order to maximize their dining experience in a healthy and nutritious manner. Included are recommended food choices based on the type of restaurant the patient is visiting.   Fueling a Banker conducted group or individual video education with verbal and written material and guidebook.  There is a strong connection between our food choices and our health. Diseases like obesity and type 2 diabetes are very prevalent and are in large-part due to lifestyle choices. The Pritikin Eating Plan provides plenty of food and hunger-curbing satisfaction. It is easy to follow, affordable, and helps reduce health risks.  Menu Workshop  Clinical staff conducted group or individual video education  with verbal and written material and guidebook.  Patient learns that restaurant meals can sabotage health goals because they are often packed with calories, fat, sodium, and sugar. Recommendations include strategies to plan ahead and to communicate with the manager, chef, or server to help order a healthier meal.  Planning Your Eating Strategy  Clinical staff conducted group or individual video education with verbal and written material and guidebook.  Patient learns about the Pritikin Eating Plan and its benefit of reducing the risk of disease. The Pritikin Eating Plan does not focus on calories. Instead, it emphasizes high-quality, nutrient-rich foods. By knowing the characteristics of the foods, we choose, we can determine their calorie density and make informed decisions.  Targeting Your Nutrition Priorities  Clinical staff conducted group or individual video education with verbal and written material and guidebook.  Patient learns that lifestyle habits have a tremendous impact on disease risk and progression. This video provides eating and physical activity recommendations based on your personal health goals, such as reducing LDL cholesterol, losing weight, preventing or controlling type 2 diabetes, and reducing high blood pressure.  Vitamins and Minerals  Clinical staff conducted group or individual video education with verbal and written material and guidebook.  Patient learns different ways to obtain key vitamins and minerals, including through a recommended healthy diet. It is important to discuss all supplements you take with your doctor.   Healthy Mind-Set    Smoking Cessation  Clinical staff conducted group or individual video education with verbal and written material and guidebook.  Patient learns that cigarette smoking and tobacco addiction pose a serious health risk which affects millions of people. Stopping smoking will significantly reduce the risk of heart disease, lung disease,  and many forms of cancer. Recommended strategies for quitting are covered, including working with your doctor to develop a successful plan.  Culinary   Becoming a Set designer conducted group or individual video education with verbal and written material and guidebook.  Patient learns that cooking at home can be healthy, cost-effective, quick, and puts them in control. Keys  to cooking healthy recipes will include looking at your recipe, assessing your equipment needs, planning ahead, making it simple, choosing cost-effective seasonal ingredients, and limiting the use of added fats, salts, and sugars.  Cooking - Breakfast and Snacks  Clinical staff conducted group or individual video education with verbal and written material and guidebook.  Patient learns how important breakfast is to satiety and nutrition through the entire day. Recommendations include key foods to eat during breakfast to help stabilize blood sugar levels and to prevent overeating at meals later in the day. Planning ahead is also a key component.  Cooking - Educational psychologist conducted group or individual video education with verbal and written material and guidebook.  Patient learns eating strategies to improve overall health, including an approach to cook more at home. Recommendations include thinking of animal protein as a side on your plate rather than center stage and focusing instead on lower calorie dense options like vegetables, fruits, whole grains, and plant-based proteins, such as beans. Making sauces in large quantities to freeze for later and leaving the skin on your vegetables are also recommended to maximize your experience.  Cooking - Healthy Salads and Dressing Clinical staff conducted group or individual video education with verbal and written material and guidebook.  Patient learns that vegetables, fruits, whole grains, and legumes are the foundations of the Pritikin Eating Plan.  Recommendations include how to incorporate each of these in flavorful and healthy salads, and how to create homemade salad dressings. Proper handling of ingredients is also covered. Cooking - Soups and [employer] - Soups and Desserts Clinical staff conducted group or individual video education with verbal and written material and guidebook.  Patient learns that Pritikin soups and desserts make for easy, nutritious, and delicious snacks and meal components that are low in sodium, fat, sugar, and calorie density, while high in vitamins, minerals, and filling fiber. Recommendations include simple and healthy ideas for soups and desserts.   Overview     The Pritikin Solution Program Overview Clinical staff conducted group or individual video education with verbal and written material and guidebook.  Patient learns that the results of the Pritikin Program have been documented in more than 100 articles published in peer-reviewed journals, and the benefits include reducing risk factors for (and, in some cases, even reversing) high cholesterol, high blood pressure, type 2 diabetes, obesity, and more! An overview of the three key pillars of the Pritikin Program will be covered: eating well, doing regular exercise, and having a healthy mind-set.  WORKSHOPS  Exercise: Exercise Basics: Building Your Action Plan Clinical staff led group instruction and group discussion with PowerPoint presentation and patient guidebook. To enhance the learning environment the use of posters, models and videos may be added. At the conclusion of this workshop, patients will comprehend the difference between physical activity and exercise, as well as the benefits of incorporating both, into their routine. Patients will understand the FITT (Frequency, Intensity, Time, and Type) principle and how to use it to build an exercise action plan. In addition, safety concerns and other considerations for exercise and cardiac rehab  will be addressed by the presenter. The purpose of this lesson is to promote a comprehensive and effective weekly exercise routine in order to improve patients' overall level of fitness.   Managing Heart Disease: Your Path to a Healthier Heart Clinical staff led group instruction and group discussion with PowerPoint presentation and patient guidebook. To enhance the learning environment the use of  posters, models and videos may be added.At the conclusion of this workshop, patients will understand the anatomy and physiology of the heart. Additionally, they will understand how Pritikin's three pillars impact the risk factors, the progression, and the management of heart disease.  The purpose of this lesson is to provide a high-level overview of the heart, heart disease, and how the Pritikin lifestyle positively impacts risk factors.  Exercise Biomechanics Clinical staff led group instruction and group discussion with PowerPoint presentation and patient guidebook. To enhance the learning environment the use of posters, models and videos may be added. Patients will learn how the structural parts of their bodies function and how these functions impact their daily activities, movement, and exercise. Patients will learn how to promote a neutral spine, learn how to manage pain, and identify ways to improve their physical movement in order to promote healthy living. The purpose of this lesson is to expose patients to common physical limitations that impact physical activity. Participants will learn practical ways to adapt and manage aches and pains, and to minimize their effect on regular exercise. Patients will learn how to maintain good posture while sitting, walking, and lifting.  Balance Training and Fall Prevention  Clinical staff led group instruction and group discussion with PowerPoint presentation and patient guidebook. To enhance the learning environment the use of posters, models and videos  may be added. At the conclusion of this workshop, patients will understand the importance of their sensorimotor skills (vision, proprioception, and the vestibular system) in maintaining their ability to balance as they age. Patients will apply a variety of balancing exercises that are appropriate for their current level of function. Patients will understand the common causes for poor balance, possible solutions to these problems, and ways to modify their physical environment in order to minimize their fall risk. The purpose of this lesson is to teach patients about the importance of maintaining balance as they age and ways to minimize their risk of falling.  WORKSHOPS   Nutrition:  Fueling a Ship broker led group instruction and group discussion with PowerPoint presentation and patient guidebook. To enhance the learning environment the use of posters, models and videos may be added. Patients will review the foundational principles of the Pritikin Eating Plan and understand what constitutes a serving size in each of the food groups. Patients will also learn Pritikin-friendly foods that are better choices when away from home and review make-ahead meal and snack options. Calorie density will be reviewed and applied to three nutrition priorities: weight maintenance, weight loss, and weight gain. The purpose of this lesson is to reinforce (in a group setting) the key concepts around what patients are recommended to eat and how to apply these guidelines when away from home by planning and selecting Pritikin-friendly options. Patients will understand how calorie density may be adjusted for different weight management goals.  Mindful Eating  Clinical staff led group instruction and group discussion with PowerPoint presentation and patient guidebook. To enhance the learning environment the use of posters, models and videos may be added. Patients will briefly review the concepts of the Pritikin  Eating Plan and the importance of low-calorie dense foods. The concept of mindful eating will be introduced as well as the importance of paying attention to internal hunger signals. Triggers for non-hunger eating and techniques for dealing with triggers will be explored. The purpose of this lesson is to provide patients with the opportunity to review the basic principles of the Pritikin Eating Plan, discuss  the value of eating mindfully and how to measure internal cues of hunger and fullness using the Hunger Scale. Patients will also discuss reasons for non-hunger eating and learn strategies to use for controlling emotional eating.  Targeting Your Nutrition Priorities Clinical staff led group instruction and group discussion with PowerPoint presentation and patient guidebook. To enhance the learning environment the use of posters, models and videos may be added. Patients will learn how to determine their genetic susceptibility to disease by reviewing their family history. Patients will gain insight into the importance of diet as part of an overall healthy lifestyle in mitigating the impact of genetics and other environmental insults. The purpose of this lesson is to provide patients with the opportunity to assess their personal nutrition priorities by looking at their family history, their own health history and current risk factors. Patients will also be able to discuss ways of prioritizing and modifying the Pritikin Eating Plan for their highest risk areas  Menu  Clinical staff led group instruction and group discussion with PowerPoint presentation and patient guidebook. To enhance the learning environment the use of posters, models and videos may be added. Using menus brought in from [employer], or printed from [employer], patients will apply the Pritikin dining out guidelines that were presented in the [employer] video. Patients will also be able to practice these guidelines  in a variety of provided scenarios. The purpose of this lesson is to provide patients with the opportunity to practice hands-on learning of the Pritikin Dining Out guidelines with actual menus and practice scenarios.  Label Reading Clinical staff led group instruction and group discussion with PowerPoint presentation and patient guidebook. To enhance the learning environment the use of posters, models and videos may be added. Patients will review and discuss the Pritikin label reading guidelines presented in Pritikin's Label Reading Educational series video. Using fool labels brought in from local grocery stores and markets, patients will apply the label reading guidelines and determine if the packaged food meet the Pritikin guidelines. The purpose of this lesson is to provide patients with the opportunity to review, discuss, and practice hands-on learning of the Pritikin Label Reading guidelines with actual packaged food labels. Cooking School  Pritikin's [employer] are designed to teach patients ways to prepare quick, simple, and affordable recipes at home. The importance of nutrition's role in chronic disease risk reduction is reflected in its emphasis in the overall Pritikin program. By learning how to prepare essential core Pritikin Eating Plan recipes, patients will increase control over what they eat; be able to customize the flavor of foods without the use of added salt, sugar, or fat; and improve the quality of the food they consume. By learning a set of core recipes which are easily assembled, quickly prepared, and affordable, patients are more likely to prepare more healthy foods at home. These workshops focus on convenient breakfasts, simple entres, side dishes, and desserts which can be prepared with minimal effort and are consistent with nutrition recommendations for cardiovascular risk reduction. Cooking [employer] are taught by a Armed forces logistics/support/administrative officer (RD) who has  been trained by the [employer]. The chef or RD has a clear understanding of the importance of minimizing - if not completely eliminating - added fat, sugar, and sodium in recipes. Throughout the series of Cooking School Workshop sessions, patients will learn about healthy ingredients and efficient methods of cooking to build confidence in their capability to prepare  Cooking School weekly topics:  Adding Flavor- Sodium-Free  Fast and Healthy Breakfasts  Powerhouse Plant-Based Proteins  Satisfying Salads and Dressings  Simple Sides and Sauces  International Cuisine-Spotlight on the [employer] Zones  Delicious Desserts  Savory Soups  Efficiency Cooking - Meals in a Snap  Tasty Appetizers and Snacks  Comforting Weekend Breakfasts  One-Pot Wonders   Fast Evening Meals  Landscape architect Your Pritikin Plate  WORKSHOPS   Healthy Mindset (Psychosocial):  Focused Goals, Sustainable Changes Clinical staff led group instruction and group discussion with PowerPoint presentation and patient guidebook. To enhance the learning environment the use of posters, models and videos may be added. Patients will be able to apply effective goal setting strategies to establish at least one personal goal, and then take consistent, meaningful action toward that goal. They will learn to identify common barriers to achieving personal goals and develop strategies to overcome them. Patients will also gain an understanding of how our mind-set can impact our ability to achieve goals and the importance of cultivating a positive and growth-oriented mind-set. The purpose of this lesson is to provide patients with a deeper understanding of how to set and achieve personal goals, as well as the tools and strategies needed to overcome common obstacles which may arise along the way.  From Head to Heart: The Power of a Healthy Outlook  Clinical staff led group instruction and group discussion with  PowerPoint presentation and patient guidebook. To enhance the learning environment the use of posters, models and videos may be added. Patients will be able to recognize and describe the impact of emotions and mood on physical health. They will discover the importance of self-care and explore self-care practices which may work for them. Patients will also learn how to utilize the 4 C's to cultivate a healthier outlook and better manage stress and challenges. The purpose of this lesson is to demonstrate to patients how a healthy outlook is an essential part of maintaining good health, especially as they continue their cardiac rehab journey.  Healthy Sleep for a Healthy Heart Clinical staff led group instruction and group discussion with PowerPoint presentation and patient guidebook. To enhance the learning environment the use of posters, models and videos may be added. At the conclusion of this workshop, patients will be able to demonstrate knowledge of the importance of sleep to overall health, well-being, and quality of life. They will understand the symptoms of, and treatments for, common sleep disorders. Patients will also be able to identify daytime and nighttime behaviors which impact sleep, and they will be able to apply these tools to help manage sleep-related challenges. The purpose of this lesson is to provide patients with a general overview of sleep and outline the importance of quality sleep. Patients will learn about a few of the most common sleep disorders. Patients will also be introduced to the concept of "sleep hygiene," and discover ways to self-manage certain sleeping problems through simple daily behavior changes. Finally, the workshop will motivate patients by clarifying the links between quality sleep and their goals of heart-healthy living.   Recognizing and Reducing Stress Clinical staff led group instruction and group discussion with PowerPoint presentation and patient guidebook. To  enhance the learning environment the use of posters, models and videos may be added. At the conclusion of this workshop, patients will be able to understand the types of stress reactions, differentiate between acute and chronic stress, and recognize the impact that chronic stress has on their health. They will  also be able to apply different coping mechanisms, such as reframing negative self-talk. Patients will have the opportunity to practice a variety of stress management techniques, such as deep abdominal breathing, progressive muscle relaxation, and/or guided imagery.  The purpose of this lesson is to educate patients on the role of stress in their lives and to provide healthy techniques for coping with it.  Learning Barriers/Preferences:  Learning Barriers/Preferences - 01/22/24 1411       Learning Barriers/Preferences   Learning Barriers Sight   reading glasses   Learning Preferences Audio;Computer/Internet;Group Instruction;Individual Instruction;Skilled Demonstration;Verbal Instruction;Video;Written Material;Pictoral             Education Topics:  Knowledge Questionnaire Score:  Knowledge Questionnaire Score - 01/22/24 1542       Knowledge Questionnaire Score   Pre Score 22/24             Core Components/Risk Factors/Patient Goals at Admission:  Personal Goals and Risk Factors at Admission - 01/22/24 1411       Core Components/Risk Factors/Patient Goals on Admission    Weight Management Yes;Weight Loss;Obesity    Intervention Weight Management: Develop a combined nutrition and exercise program designed to reach desired caloric intake, while maintaining appropriate intake of nutrient and fiber, sodium and fats, and appropriate energy expenditure required for the weight goal.;Weight Management: Provide education and appropriate resources to help participant work on and attain dietary goals.;Weight Management/Obesity: Establish reasonable short term and long term weight  goals.;Obesity: Provide education and appropriate resources to help participant work on and attain dietary goals.    Admit Weight 202 lb 2.6 oz (91.7 kg)    Goal Weight: Long Term 200 lb (90.7 kg)   pt goal   Expected Outcomes Short Term: Continue to assess and modify interventions until short term weight is achieved;Weight Loss: Understanding of general recommendations for a balanced deficit meal plan, which promotes 1-2 lb weight loss per week and includes a negative energy balance of [phone] kcal/d;Understanding recommendations for meals to include 15-35% energy as protein, 25-35% energy from fat, 35-60% energy from carbohydrates, less than 200mg  of dietary cholesterol, 20-35 gm of total fiber daily;Understanding of distribution of calorie intake throughout the day with the consumption of 4-5 meals/snacks;Long Term: Adherence to nutrition and physical activity/exercise program aimed toward attainment of established weight goal    Hypertension Yes    Intervention Provide education on lifestyle modifcations including regular physical activity/exercise, weight management, moderate sodium restriction and increased consumption of fresh fruit, vegetables, and low fat dairy, alcohol moderation, and smoking cessation.;Monitor prescription use compliance.    Expected Outcomes Long Term: Maintenance of blood pressure at goal levels.;Short Term: Continued assessment and intervention until BP is < 140/50mm HG in hypertensive participants. < 130/9mm HG in hypertensive participants with diabetes, heart failure or chronic kidney disease.    Lipids Yes    Intervention Provide education and support for participant on nutrition & aerobic/resistive exercise along with prescribed medications to achieve LDL 70mg , HDL >40mg .    Expected Outcomes Short Term: Participant states understanding of desired cholesterol values and is compliant with medications prescribed. Participant is following exercise prescription and  nutrition guidelines.;Long Term: Cholesterol controlled with medications as prescribed, with individualized exercise RX and with personalized nutrition plan. Value goals: LDL < 70mg , HDL > 40 mg.             Core Components/Risk Factors/Patient Goals Review:   Goals and Risk Factor Review     Row Name 02/20/24 [phone] 03/05/24 1714  Core Components/Risk Factors/Patient Goals Review   Personal Goals Review Weight Management/Obesity;Hypertension;Lipids;Stress Weight Management/Obesity;Hypertension;Lipids;Stress      Review Terry Vega started cardiac rehab on 02/19/24. Terry Vega did well with exercise and denied having any chest pain. Vital signs were stable. Terry Vega is doing well  with exercise  at cardiac rehab. Vital signs have been stable. Terry Vega has lost 2.9 kg since starting the program.      Expected Outcomes Terry Vega will continue to participate in cardiac rehab for exercise, nutrition and lifestyle modifications Terry Vega will continue to participate in cardiac rehab for exercise, nutrition and lifestyle modifications               Core Components/Risk Factors/Patient Goals at Discharge (Final Review):   Goals and Risk Factor Review - 03/05/24 1714       Core Components/Risk Factors/Patient Goals Review   Personal Goals Review Weight Management/Obesity;Hypertension;Lipids;Stress    Review Terry Vega is doing well  with exercise  at cardiac rehab. Vital signs have been stable. Terry Vega has lost 2.9 kg since starting the program.    Expected Outcomes Terry Vega will continue to participate in cardiac rehab for exercise, nutrition and lifestyle modifications             ITP Comments:  ITP Comments     Row Name 01/22/24 1406 02/08/24 1432 02/20/24 0946 03/05/24 1711     ITP Comments Dr. Gaylyn Keas medical director. Introduction to pritikin education/intensive cardiac rehab. Initial orientation packet reviewed with patient. 30 Day ITP Review.    Patient has been cleared to return to cardiac rehab per  Dr Alvenia Aus. Terry Vega plans to return on 02/19/24 to begin exercise. Terry Vega is starting his new dose of isosorbide  and wants to given the medication time to take effect. 30 Day ITP Review.    Terry Vega began exercise on 02/19/24. Russ tolerated exercise without complaints or angina. 30 Day ITP Review.  Terry Vega has good attendance and participation with  exercise at cardiac rehab.             Comments: See ITP comments.Monte Antonio RN BSN

## 2024-03-06 ENCOUNTER — Encounter (HOSPITAL_COMMUNITY)
Admission: RE | Admit: 2024-03-06 | Discharge: 2024-03-06 | Disposition: A | Discharge status: Home / Self Care | Source: Ambulatory Visit | Attending: Cardiovascular Disease

## 2024-03-06 DIAGNOSIS — Z951 Presence of aortocoronary bypass graft: Secondary | ICD-10-CM

## 2024-03-07 ENCOUNTER — Ambulatory Visit: Attending: Cardiology | Admitting: Pharmacist Clinician (PhC)/ Clinical Pharmacy Specialist

## 2024-03-07 ENCOUNTER — Encounter: Payer: Self-pay | Admitting: Pharmacist Clinician (PhC)/ Clinical Pharmacy Specialist

## 2024-03-07 DIAGNOSIS — E785 Hyperlipidemia, unspecified: Secondary | ICD-10-CM | POA: Insufficient documentation

## 2024-03-07 LAB — LIPID PANEL
Chol/HDL Ratio: 3.2 ratio (ref 0.0–5.0)
Cholesterol, Total: 113 mg/dL (ref 100–199)
HDL: 35 mg/dL — ABNORMAL LOW (ref >39)
LDL Chol Calc (NIH): 62 mg/dL (ref 0–99)
Triglycerides: 77 mg/dL (ref 0–149)
VLDL Cholesterol Cal: 16 mg/dL (ref 5–40)

## 2024-03-07 NOTE — Assessment & Plan Note (Signed)
 Assessment: Patient with ASCVD not at LDL goal of < 55 Most recent LDL 91 on 08/04/23 Has been compliant with high intensity statin : atorvastatin  40 mg Reviewed options for lowering LDL cholesterol, including ezetimibe, PCSK-9 inhibitors, bempedoic acid and inclisiran.  Discussed mechanisms of action, dosing, side effects, potential decreases in LDL cholesterol and costs.  Also reviewed potential options for patient assistance.  Plan: Will need updated labs, drawn today Patient agreeable to starting Repatha 140 mg q14d Repeat labs after:  3 months Lipid Liver function

## 2024-03-07 NOTE — Progress Notes (Signed)
 Office Visit    Patient Name: Terry Vega Date of Encounter: 03/07/2024  Primary Care Provider:  Benjiman Bras, MD Primary Cardiologist:  Antionette Kirks, MD  Chief Complaint    Hyperlipidemia   Significant Past Medical History   CAD 1/25 CABG x 5; 3/25 occlusion in 2 of the bypasses  HTN Currently controlled on carvedilol   PVC  No recent symptoms, on carvedilol            No Known Allergies  History of Present Illness    Terry Vega is a 78 y.o. male patient of Dr Alvenia Aus, in the office today to discuss options for cholesterol management.  Insurance Carrier: [employer] [id number]  $250 deductilbe, $47/month  Pharmacy:   [facility] [place]; [facility]   Healthwell:  not at this time  LDL Cholesterol goal:  LDL< 55  Current Medications: atorvastatin  40 mg every day      Family Hx:  mother had CHF died at 14; father did not have heart issues; 1 sister without heart issues;  3 kids, no heart issues    Social Hx: Tobacco: no Alcohol: occasional    Diet:  more home cooked, with occasional meals out;  rare beef or pork, just chicken and fish, some nuts/beans for protein; vegetables mix fresh or frozen; low sodium Terry Vega   Exercise: cardiac rehab 2 days per week  Accessory Clinical Findings   Lab Results  Component Value Date   CHOL 152 08/04/2023   HDL 42.00 08/04/2023   LDLCALC 91 08/04/2023   TRIG 97.0 08/04/2023   CHOLHDL 4 08/04/2023    Lipoprotein (a)  Date/Time Value Ref Range Status  12/17/2023 02:41 AM 175.9 (H) <75.0 nmol/L Final    Comment:    (NOTE) Note:  Values greater than or equal to 75.0 nmol/L may       indicate an independent risk factor for CHD,       but must be evaluated with caution when applied       to non-Caucasian populations due to the       influence of genetic factors on Lp(a) across       ethnicities. Performed At: [facility] [address] [place], [place] [id number] Pearlean Botts MD  HQ:[id number]     Lab Results  Component Value Date   ALT 26 12/18/2023   AST 31 12/18/2023   ALKPHOS 29 (L) 12/18/2023   BILITOT 0.7 12/18/2023   Lab Results  Component Value Date   CREATININE 0.83 01/30/2024   BUN 14 01/30/2024   NA 143 01/30/2024   K 5.2 01/30/2024   CL 103 01/30/2024   CO2 25 01/30/2024   Lab Results  Component Value Date   HGBA1C 5.2 12/18/2023    Home Medications    Current Outpatient Medications  Medication Sig Dispense Refill   aspirin  EC 81 MG tablet Take 1 tablet (81 mg total) by mouth daily. Swallow whole.     atorvastatin  (LIPITOR) 40 MG tablet Take 1 tablet (40 mg total) by mouth daily. 30 tablet 6   carvedilol  (COREG ) 6.25 MG tablet Take 1 tablet (6.25 mg total) by mouth 2 (two) times daily. 180 tablet 3   cholecalciferol (VITAMIN D ) 1000 UNITS tablet Take 1,000 Units by mouth daily.     clopidogrel  (PLAVIX ) 75 MG tablet Take 1 tablet (75 mg total) by mouth daily. 90 tablet 3   isosorbide  mononitrate (IMDUR ) 60 MG 24 hr tablet Take 1 tablet (60 mg total)  by mouth daily. 90 tablet 1   levothyroxine  (SYNTHROID ) 100 MCG tablet Take 1 tablet (100 mcg total) by mouth daily. 90 tablet 3   Multiple Vitamin (MULTIVITAMIN) tablet Take 1 tablet by mouth daily.     nitroGLYCERIN  (NITROSTAT ) 0.4 MG SL tablet Place 0.4 mg under the tongue every 5 (five) minutes as needed for chest pain.     PREVIDENT 5000 BOOSTER PLUS 1.1 % PSTE Place 1 application  onto teeth at bedtime.     triamcinolone  cream (KENALOG ) 0.1 % Apply 1 Application topically daily as needed (irritation).     vitamin E 1000 UNIT capsule Take 1,000 Units by mouth daily.     No current facility-administered medications for this visit.     Assessment & Plan    Hyperlipidemia LDL goal <55 Assessment: Patient with ASCVD not at LDL goal of < 55 Most recent LDL 91 on 08/04/23 Has been compliant with high intensity statin : atorvastatin  40 mg Reviewed options for lowering LDL cholesterol,  including ezetimibe, PCSK-9 inhibitors, bempedoic acid and inclisiran.  Discussed mechanisms of action, dosing, side effects, potential decreases in LDL cholesterol and costs.  Also reviewed potential options for patient assistance.  Plan: Will need updated labs, drawn today Patient agreeable to starting Repatha 140 mg q14d Repeat labs after:  3 months Lipid Liver function   Donivan Furry, PharmD CPP [facility] [address]  [place], [place] [address] [phone]  03/07/2024, 10:55 AM

## 2024-03-07 NOTE — Patient Instructions (Signed)
 Your Results:             Your most recent labs Goal  Total Cholesterol 152 < 200  Triglycerides 97 < 150  HDL (happy/good cholesterol) 42 > 40  LDL (lousy/bad cholesterol 91 < 55  Lp(a) 175.9 < 75   Medication changes:  We will get labs today then start the process to get Repatha covered by your insurance.  Once the prior authorization is complete, I will send a MyChart message to let you know and confirm pharmacy information.   You will take one injection every 14 days  Lab orders:  We want to repeat labs after 2-3 months.  We will send you a lab order to remind you once we get closer to that time.    Patient Assistance:    We will sign you up for a Healthwell Grant once your medication is approved by [employer].  I will call you with the ID number, then you will take this information to the pharmacy.  They will bill it after your insurance, bringing your copay to $0.  The grant will pay the first $2,500 in a one year period.    ID   BIN [id number]  PCN [id number]  GRP [id number]    Thank you for choosing [facility]

## 2024-03-08 ENCOUNTER — Encounter (HOSPITAL_COMMUNITY)

## 2024-03-11 ENCOUNTER — Telehealth: Payer: Self-pay | Admitting: Pharmacy Technician

## 2024-03-11 ENCOUNTER — Telehealth: Payer: Self-pay | Admitting: Pharmacist Clinician (PhC)/ Clinical Pharmacy Specialist

## 2024-03-11 ENCOUNTER — Encounter (HOSPITAL_COMMUNITY)
Admission: RE | Admit: 2024-03-11 | Discharge: 2024-03-11 | Disposition: A | Discharge status: Home / Self Care | Source: Ambulatory Visit | Attending: Cardiovascular Disease | Admitting: Cardiovascular Disease

## 2024-03-11 ENCOUNTER — Other Ambulatory Visit (HOSPITAL_COMMUNITY): Payer: Self-pay

## 2024-03-11 ENCOUNTER — Encounter: Payer: Self-pay | Admitting: Pharmacist Clinician (PhC)/ Clinical Pharmacy Specialist

## 2024-03-11 DIAGNOSIS — Z951 Presence of aortocoronary bypass graft: Secondary | ICD-10-CM

## 2024-03-11 NOTE — Telephone Encounter (Signed)
 The 40 mg atorvastatin  is high intensity, so that shouldn't be an issue that he hasn't tried a second statin.  Go ahead and mark the box that he doesn't have SAMs and lets see what they say.

## 2024-03-11 NOTE — Telephone Encounter (Signed)
 Pharmacy Patient Advocate Encounter  Received notification from [employer] that Prior Authorization for repatha has been APPROVED from 11/15/23 to 11/13/24. Ran test claim, Copay is $293.90. This test claim was processed through [facility]- copay amounts may vary at other pharmacies due to pharmacy/plan contracts, or as the patient moves through the different stages of their insurance plan.   PA #/Case ID/Reference #: [id number]    Do you want me to get him a healthwell grant?

## 2024-03-11 NOTE — Telephone Encounter (Signed)
 Pharmacy Patient Advocate Encounter   Received notification from Pt Calls Messages that prior authorization for repatha is required/requested.   Insurance verification completed.   The patient is insured through [employer] .   Per test claim: PA required; PA started via CoverMyMeds. KEY [id number] . Please see clinical question(s) below that I am not finding the answer to in her chart and advise.   You routed this conversation to Alvstad, Kristin L, RPH-CPP Me     03/11/24  9:35 AM Note Hi, the prior authorization is asking:    It appears he has only tried atorvastatin  40mg  .. Would you like for me to continue submitting the prior authorization and put none of the above? Based on these questions, it would seem this would be denied but I wanted to send in case I was missing something. Thank you!

## 2024-03-11 NOTE — Telephone Encounter (Signed)
 Please do PA for Repatha.  LDL goal is < 55.  Had CABG 11/2023, occlusion in 2 of those 01/2024

## 2024-03-11 NOTE — Telephone Encounter (Signed)
 Just let me know what the response is.. He sent me a message a few minutes ago saying he'd like to just do the atorvastatin  for now and see if the labs improve in a few more months

## 2024-03-11 NOTE — Telephone Encounter (Signed)
 Pharmacy Patient Advocate Encounter   Received notification from Pt Calls Messages that prior authorization for repatha is required/requested.   Insurance verification completed.   The patient is insured through [employer] .   Per test claim: PA required; PA submitted to above mentioned insurance via CoverMyMeds Key/confirmation #/EOC [id number] Status is pending

## 2024-03-11 NOTE — Telephone Encounter (Signed)
 Hi, the prior authorization is asking:   It appears he has only tried atorvastatin  40mg  .. Would you like for me to continue submitting the prior authorization and put none of the above? Based on these questions, it would seem this would be denied but I wanted to send in case I was missing something. Thank you!

## 2024-03-11 NOTE — Telephone Encounter (Signed)
 No don't get the grant.  I think he noted that he wouldn't qualify.  Plus I think he's changing his mind.  Appreciate all your help.

## 2024-03-12 ENCOUNTER — Encounter: Payer: Self-pay | Admitting: Cardiovascular Disease

## 2024-03-12 ENCOUNTER — Ambulatory Visit: Attending: Cardiovascular Disease | Admitting: Cardiovascular Disease

## 2024-03-12 VITALS — BP 114/64 | HR 75 | Ht 67.0 in | Wt 196.6 lb

## 2024-03-12 DIAGNOSIS — R079 Chest pain, unspecified: Secondary | ICD-10-CM

## 2024-03-12 DIAGNOSIS — E785 Hyperlipidemia, unspecified: Secondary | ICD-10-CM | POA: Diagnosis not present

## 2024-03-12 DIAGNOSIS — I493 Ventricular premature depolarization: Secondary | ICD-10-CM | POA: Diagnosis not present

## 2024-03-12 DIAGNOSIS — I25708 Atherosclerosis of coronary artery bypass graft(s), unspecified, with other forms of angina pectoris: Secondary | ICD-10-CM

## 2024-03-12 MED ORDER — EZETIMIBE 10 MG PO TABS: 10.0000 mg | ORAL_TABLET | Freq: Every day | ORAL | 3 refills | Status: DC

## 2024-03-12 NOTE — Patient Instructions (Signed)
 Medication Instructions:  START Ezetimibe (Zetia) 10 mg once daily  *If you need a refill on your cardiac medications before your next appointment, please call your pharmacy*  Lab Work: Your provider would like for you to return in 2 months to have the following labs drawn: Lipid and Liver.   Please go to [facility] [address] [address], [place] [address] You do not need an appointment.  They are open from 8 am- 4:30 pm.  Lunch from 1:00 pm- 2:00 pm You will need to be fasting.   You may also go to one of the following [facility]:  [address] [place], [place] [address] Phone: [phone] Lab hours: Mon-Fri 8 am- 5 pm    Lunch 12 pm- 1 pm  [address] [place],  [place]  [address]  [place]  Phone: [phone] Lab hours: 7 am- 4 pm Lunch 12 pm-1 pm   [address] [place],  [place]  [address]  [place]  Phone: [phone] Lab hours: Mon-Fri 8 am- 5 pm    Lunch 12 pm- 1 pm  If you have labs (blood work) drawn today and your tests are completely normal, you will receive your results only by: MyChart Message (if you have MyChart) OR A paper copy in the mail If you have any lab test that is abnormal or we need to change your treatment, we will call you to review the results.  Testing/Procedures: None ordered  Follow-Up: At [facility], you and your health needs are our priority.  As part of our continuing mission to provide you with exceptional heart care, our providers are all part of one team.  This team includes your primary Cardiologist (physician) and Advanced Practice Providers or APPs (Physician Assistants and Nurse Practitioners) who all work together to provide you with the care you need, when you need it.  Your next appointment:   6 month(s)  Provider:   Antionette Kirks, MD    We recommend signing up for the patient portal called "MyChart".  Sign up information is provided on this After Visit Summary.  MyChart is used to  connect with patients for Virtual Visits (Telemedicine).  Patients are able to view lab/test results, encounter notes, upcoming appointments, etc.  Non-urgent messages can be sent to your provider as well.   To learn more about what you can do with MyChart, go to [email or web address].

## 2024-03-12 NOTE — Progress Notes (Signed)
 Cardiology Office Note   Date:  03/12/2024   ID:  Haywood, Ostaszewski May 14, 1946, MRN [id number]  PCP:  Benjiman Bras, MD  Cardiologist:   Antionette Kirks, MD   No chief complaint on file.     History of Present Illness: CHIOKE JORDE is a 78 y.o. male who presents for a followup visit regarding coronary artery disease and PVCs.   He has prolonged history of hypertension. He has hypothyroidism and has been on replacement therapy for many years.  He has been followed for a long time for PVCs that responded to a beta-blocker. He had unstable angina in January of this year.  Cardiac catheterization showed severe three-vessel coronary artery disease.  He was transferred to [facility] and underwent CABG with LIMA to LAD, SVG to first diagonal, SVG to second diagonal, SVG to OM and SVG to distal RCA.  Postoperative course was uncomplicated. He was seen in the office in February and was doing well with no anginal symptoms.  However, he was seen on March 18 with recurrent angina.  Thus, he underwent cardiac catheterization which showed stable native three-vessel coronary artery disease with 2 occluded grafts including SVG to OM and SVG to first diagonal.  LIMA to LAD, SVG to second diagonal and SVG to right PDA were patent.  EF was normal with an LVEDP of 20 mmHg. Native left circumflex had complex bifurcation mid disease with subtotal occlusion.  PCI was attempted but was not able to cross with a wire.  Imdur  30 mg daily was added.  Last visit, I increased Imdur  to 60 mg once daily.  He has been attending cardiac rehab with no anginal symptoms they have.  He is feeling better overall.  Past Medical History:  Diagnosis Date   Allergy    Arthritis    Phreesia 07/21/2020   CAD (coronary artery disease)    a. 11/2023 Cath: LM 80m/d, LAD 70p/m, D1 99, D2 80, LCX 70ost, 21m/d, OM2 90, RCA 40p/m, 90d, EF 55-65%; b. 12/2023 CABG x 5: LIMA->LAD, VG->D1, VG->D2, VG->OM, VG->dRCA.   Diastolic  dysfunction    a. 07/2021 Echo: EF 60-65%.  Mild LVH.  Grade 1 DD.  Normal RV fxn. mildly dil LA. Triv MR/AI. Mild AoV sclerosis.   Heart murmur    Hypertension    Hypothyroidism    PVC's (premature ventricular contractions)     Past Surgical History:  Procedure Laterality Date   COLONOSCOPY     CORONARY ARTERY BYPASS GRAFT N/A 12/19/2023   Procedure: CORONARY ARTERY BYPASS GRAFTING (CABG) TIMES FIVE USING LEFT INTERNAL MAMMARY ARTERY AND BILATERAL ENDOSCOPICALLY HARVESTED GREATER SAPHENOUS VEIN;  Surgeon: Bartley Lightning, MD;  Location: [facility] OR;  Service: Open Heart Surgery;  Laterality: N/A;   CORONARY STENT INTERVENTION N/A 01/31/2024   Procedure: CORONARY STENT INTERVENTION;  Surgeon: Sammy Crisp, MD;  Location: [facility] INVASIVE CV LAB;  Service: Cardiovascular;  Laterality: N/A;   HEMORRHOID SURGERY     LEFT HEART CATH AND CORONARY ANGIOGRAPHY Left 12/15/2023   Procedure: LEFT HEART CATH AND CORONARY ANGIOGRAPHY;  Surgeon: Wenona Hamilton, MD;  Location: [facility] INVASIVE CV LAB;  Service: Cardiovascular;  Laterality: Left;   LEFT HEART CATH AND CORS/GRAFTS ANGIOGRAPHY Left 01/31/2024   Procedure: LEFT HEART CATH AND CORS/GRAFTS ANGIOGRAPHY;  Surgeon: Sammy Crisp, MD;  Location: [facility] INVASIVE CV LAB;  Service: Cardiovascular;  Laterality: Left;   TEE WITHOUT CARDIOVERSION N/A 12/19/2023   Procedure: TRANSESOPHAGEAL ECHOCARDIOGRAM (TEE);  Surgeon: Bartley Lightning,  MD;  Location: [facility] OR;  Service: Open Heart Surgery;  Laterality: N/A;   VASECTOMY       Current Outpatient Medications  Medication Sig Dispense Refill   aspirin  EC 81 MG tablet Take 1 tablet (81 mg total) by mouth daily. Swallow whole.     atorvastatin  (LIPITOR) 40 MG tablet Take 1 tablet (40 mg total) by mouth daily. 30 tablet 6   carvedilol  (COREG ) 6.25 MG tablet Take 1 tablet (6.25 mg total) by mouth 2 (two) times daily. 180 tablet 3   cholecalciferol (VITAMIN D ) 1000 UNITS tablet Take 1,000 Units by mouth daily.      clopidogrel  (PLAVIX ) 75 MG tablet Take 1 tablet (75 mg total) by mouth daily. 90 tablet 3   isosorbide  mononitrate (IMDUR ) 60 MG 24 hr tablet Take 1 tablet (60 mg total) by mouth daily. 90 tablet 1   levothyroxine  (SYNTHROID ) 100 MCG tablet Take 1 tablet (100 mcg total) by mouth daily. 90 tablet 3   Multiple Vitamin (MULTIVITAMIN) tablet Take 1 tablet by mouth daily.     nitroGLYCERIN  (NITROSTAT ) 0.4 MG SL tablet Place 0.4 mg under the tongue every 5 (five) minutes as needed for chest pain.     PREVIDENT 5000 BOOSTER PLUS 1.1 % PSTE Place 1 application  onto teeth at bedtime.     triamcinolone  cream (KENALOG ) 0.1 % Apply 1 Application topically daily as needed (irritation).     vitamin E 1000 UNIT capsule Take 1,000 Units by mouth daily.     No current facility-administered medications for this visit.    Allergies:   Patient has no known allergies.    Social History:  The patient  reports that he has never smoked. He has never used smokeless tobacco. He reports current alcohol use. He reports that he does not use drugs.   Family History:  The patient's family history includes Cancer in his father; Diabetes in his father and maternal grandmother; Heart disease in his mother; Stroke in his maternal grandmother.    ROS:  Please see the history of present illness.   Otherwise, review of systems are positive for none.   All other systems are reviewed and negative.    PHYSICAL EXAM: VS:  BP 114/64   Pulse 75   Ht 5\' 7"  (1.702 m)   Wt 196 lb 9.6 oz (89.2 kg)   SpO2 95%   BMI 30.79 kg/m  , BMI Body mass index is 30.79 kg/m. GEN: Well nourished, well developed, in no acute distress  HEENT: normal  Neck: no JVD, carotid bruits, or masses Cardiac: RRR; no murmurs, rubs, or gallops,no edema  Respiratory:  clear to auscultation bilaterally, normal work of breathing GI: soft, nontender, nondistended, + BS MS: no deformity or atrophy  Skin: warm and dry, no rash Neuro:  Strength and  sensation are intact Psych: euthymic mood, full affect   EKG:  EKG is not ordered today.       Recent Labs: 08/04/2023: TSH 4.40 12/18/2023: ALT 26 12/20/2023: Magnesium  2.5 01/30/2024: BUN 14; Creatinine, Ser 0.83; Hemoglobin 12.6; Platelets 249; Potassium 5.2; Sodium 143    Lipid Panel    Component Value Date/Time   CHOL 113 03/07/2024 1106   TRIG 77 03/07/2024 1106   HDL 35 (L) 03/07/2024 1106   CHOLHDL 3.2 03/07/2024 1106   CHOLHDL 4 08/04/2023 0839   VLDL 19.4 08/04/2023 0839   LDLCALC 62 03/07/2024 1106      Wt Readings from Last 3 Encounters:  03/12/24 196 lb  9.6 oz (89.2 kg)  02/08/24 197 lb (89.4 kg)  01/31/24 196 lb 6.4 oz (89.1 kg)         No data to display            ASSESSMENT AND PLAN:  1.  Coronary artery disease involving bypass graft with stable angina symptoms.  His symptoms improved with cardiac rehab and antianginal therapy.  Given improvement in symptoms, will hold off on attempted left circumflex PCI. He can continue cardiac rehab and can increase his weight lifting limit to 20 pounds.  Continue same medications.  2.  PVCs: No evidence of recurrent palpitations on carvedilol .    3. Essential hypertension: His blood pressure is well-controlled on current medications including carvedilol  and Imdur .  4.  Hyperlipidemia: Continue atorvastatin  40 mg daily.  Recent lipid profile showed an LDL of 62 which is close.  I discussed different options with him including PCSK9 inhibitors and oral medications.  He prefers to avoid injectables at the present time.  I added ezetimibe 10 mg daily and will repeat lipid and liver profile in 2 months.   Disposition:   FU with me in 6 months.  Signed,  Antionette Kirks, MD  03/12/2024 1:26 PM    [facility]

## 2024-03-13 ENCOUNTER — Encounter (HOSPITAL_COMMUNITY)
Admission: RE | Admit: 2024-03-13 | Discharge: 2024-03-13 | Disposition: A | Discharge status: Home / Self Care | Source: Ambulatory Visit | Attending: Cardiovascular Disease | Admitting: Cardiovascular Disease

## 2024-03-13 DIAGNOSIS — Z951 Presence of aortocoronary bypass graft: Secondary | ICD-10-CM | POA: Diagnosis not present

## 2024-03-13 NOTE — Progress Notes (Signed)
 CARDIAC REHAB PHASE 2  Reviewed home exercise with pt today. Pt is tolerating exercise well. Pt will continue to exercise on his own by walking, stretches and using his home stepper for roughly 30 minutes per session 2-3 days a week in addition to the 3 days in CRP2. Advised pt on THRR, RPE scale, hydration and temperature/humidity precautions. Reinforced NTG use, S/S to stop exercise and when to call MD vs 911. Encouraged warm up cool down and stretches with exercise sessions. Pt verbalized understanding, all questions were answered and pt was given a copy to take home.    GILROY MCKELLIPS ACSM-CEP 03/13/2024 5:02 PM

## 2024-03-15 ENCOUNTER — Encounter (HOSPITAL_COMMUNITY)

## 2024-03-18 ENCOUNTER — Encounter (HOSPITAL_COMMUNITY)
Admission: RE | Admit: 2024-03-18 | Discharge: 2024-03-18 | Disposition: A | Discharge status: Home / Self Care | Source: Ambulatory Visit | Attending: Cardiovascular Disease | Admitting: Cardiovascular Disease

## 2024-03-18 DIAGNOSIS — Z951 Presence of aortocoronary bypass graft: Secondary | ICD-10-CM | POA: Diagnosis not present

## 2024-03-20 ENCOUNTER — Encounter (HOSPITAL_COMMUNITY)
Admission: RE | Admit: 2024-03-20 | Discharge: 2024-03-20 | Disposition: A | Discharge status: Home / Self Care | Source: Ambulatory Visit | Attending: Cardiovascular Disease | Admitting: Cardiovascular Disease

## 2024-03-20 DIAGNOSIS — Z951 Presence of aortocoronary bypass graft: Secondary | ICD-10-CM | POA: Diagnosis not present

## 2024-03-22 ENCOUNTER — Encounter (HOSPITAL_COMMUNITY)

## 2024-03-25 ENCOUNTER — Encounter (HOSPITAL_COMMUNITY)
Admission: RE | Admit: 2024-03-25 | Discharge: 2024-03-25 | Disposition: A | Discharge status: Home / Self Care | Source: Ambulatory Visit | Attending: Cardiovascular Disease

## 2024-03-25 DIAGNOSIS — Z951 Presence of aortocoronary bypass graft: Secondary | ICD-10-CM | POA: Diagnosis not present

## 2024-03-27 ENCOUNTER — Encounter: Payer: Self-pay | Admitting: Family Medicine

## 2024-03-27 ENCOUNTER — Encounter (HOSPITAL_COMMUNITY)
Admission: RE | Admit: 2024-03-27 | Discharge: 2024-03-27 | Disposition: A | Discharge status: Home / Self Care | Source: Ambulatory Visit | Attending: Cardiovascular Disease | Admitting: Cardiovascular Disease

## 2024-03-27 DIAGNOSIS — Z951 Presence of aortocoronary bypass graft: Secondary | ICD-10-CM | POA: Diagnosis not present

## 2024-03-29 ENCOUNTER — Ambulatory Visit
Admission: EM | Admit: 2024-03-29 | Discharge: 2024-03-29 | Disposition: A | Discharge status: Home / Self Care | Attending: Family Medicine | Admitting: Family Medicine

## 2024-03-29 ENCOUNTER — Encounter (HOSPITAL_COMMUNITY)

## 2024-03-29 VITALS — BP 160/73 | HR 65 | Temp 98.5°F | Resp 18 | Wt 196.7 lb

## 2024-03-29 DIAGNOSIS — J0110 Acute frontal sinusitis, unspecified: Secondary | ICD-10-CM

## 2024-03-29 MED ORDER — AMOXICILLIN 875 MG PO TABS: 875.0000 mg | ORAL_TABLET | Freq: Two times a day (BID) | ORAL | 0 refills | Status: AC

## 2024-03-29 NOTE — ED Provider Notes (Signed)
 [facility]    CSN: [id number] Arrival date & time: 03/29/24  1247      History   Chief Complaint Chief Complaint  Patient presents with   Facial Pain   Headache    HPI Terry Vega is a 78 y.o. male.    Headache Patient presents today for evaluation of left-sided nasal congestion ongoing for 5 weeks. Reports that she is having pain at the upper left eye brow is new over the last few days. Concerned that he has a sinus infection.  He endorses experiencing some postnasal drainage.  No fever or  other symptoms. Past Medical History:  Diagnosis Date   Allergy    Arthritis    Phreesia 07/21/2020   CAD (coronary artery disease)    a. 11/2023 Cath: LM 49m/d, LAD 70p/m, D1 99, D2 80, LCX 70ost, 30m/d, OM2 90, RCA 40p/m, 90d, EF 55-65%; b. 12/2023 CABG x 5: LIMA->LAD, VG->D1, VG->D2, VG->OM, VG->dRCA.   Diastolic dysfunction    a. 07/2021 Echo: EF 60-65%.  Mild LVH.  Grade 1 DD.  Normal RV fxn. mildly dil LA. Triv MR/AI. Mild AoV sclerosis.   Heart murmur    Hypertension    Hypothyroidism    PVC's (premature ventricular contractions)     Patient Active Problem List   Diagnosis Date Noted   Hyperlipidemia LDL goal <55 03/07/2024   Coronary artery disease involving coronary bypass graft of native heart with unstable angina pectoris (HCC) 01/31/2024   S/P CABG x 5 12/19/2023   Unstable angina (HCC) 12/15/2023   Leg edema 05/06/2014   Frequent PVCs 10/15/2013   High blood pressure 10/08/2012   Hypothyroid 10/08/2012    Past Surgical History:  Procedure Laterality Date   COLONOSCOPY     CORONARY ARTERY BYPASS GRAFT N/A 12/19/2023   Procedure: CORONARY ARTERY BYPASS GRAFTING (CABG) TIMES FIVE USING LEFT INTERNAL MAMMARY ARTERY AND BILATERAL ENDOSCOPICALLY HARVESTED GREATER SAPHENOUS VEIN;  Surgeon: Bartley Lightning, MD;  Location: [facility] OR;  Service: Open Heart Surgery;  Laterality: N/A;   CORONARY STENT INTERVENTION N/A 01/31/2024   Procedure: CORONARY STENT  INTERVENTION;  Surgeon: Sammy Crisp, MD;  Location: [facility] INVASIVE CV LAB;  Service: Cardiovascular;  Laterality: N/A;   HEMORRHOID SURGERY     LEFT HEART CATH AND CORONARY ANGIOGRAPHY Left 12/15/2023   Procedure: LEFT HEART CATH AND CORONARY ANGIOGRAPHY;  Surgeon: Wenona Hamilton, MD;  Location: [facility] INVASIVE CV LAB;  Service: Cardiovascular;  Laterality: Left;   LEFT HEART CATH AND CORS/GRAFTS ANGIOGRAPHY Left 01/31/2024   Procedure: LEFT HEART CATH AND CORS/GRAFTS ANGIOGRAPHY;  Surgeon: Sammy Crisp, MD;  Location: [facility] INVASIVE CV LAB;  Service: Cardiovascular;  Laterality: Left;   TEE WITHOUT CARDIOVERSION N/A 12/19/2023   Procedure: TRANSESOPHAGEAL ECHOCARDIOGRAM (TEE);  Surgeon: Bartley Lightning, MD;  Location: [facility] OR;  Service: Open Heart Surgery;  Laterality: N/A;   VASECTOMY         Home Medications    Prior to Admission medications   Medication Sig Start Date End Date Taking? Authorizing Provider  amoxicillin (AMOXIL) 875 MG tablet Take 1 tablet (875 mg total) by mouth 2 (two) times daily for 7 days. 03/29/24 04/05/24 Yes Buena Carmine, NP  aspirin  EC 81 MG tablet Take 1 tablet (81 mg total) by mouth daily. Swallow whole. 01/31/24 01/30/25  End, Veryl Gottron, MD  atorvastatin  (LIPITOR) 40 MG tablet Take 1 tablet (40 mg total) by mouth daily. 12/15/23 12/14/24  Wenona Hamilton, MD  carvedilol  (COREG ) 6.25 MG tablet  Take 1 tablet (6.25 mg total) by mouth 2 (two) times daily. 10/17/23   Wenona Hamilton, MD  cholecalciferol (VITAMIN D ) 1000 UNITS tablet Take 1,000 Units by mouth daily.    [provider]  clopidogrel  (PLAVIX ) 75 MG tablet Take 1 tablet (75 mg total) by mouth daily. 01/31/24 01/30/25  End, Veryl Gottron, MD  ezetimibe  (ZETIA ) 10 MG tablet Take 1 tablet (10 mg total) by mouth daily. 03/12/24 06/10/24  Wenona Hamilton, MD  isosorbide  mononitrate (IMDUR ) 60 MG 24 hr tablet Take 1 tablet (60 mg total) by mouth daily. 02/08/24 02/07/25  Wenona Hamilton, MD   levothyroxine  (SYNTHROID ) 100 MCG tablet Take 1 tablet (100 mcg total) by mouth daily. 08/04/23   Benjiman Bras, MD  Multiple Vitamin (MULTIVITAMIN) tablet Take 1 tablet by mouth daily.    [provider]  nitroGLYCERIN  (NITROSTAT ) 0.4 MG SL tablet Place 0.4 mg under the tongue every 5 (five) minutes as needed for chest pain.    [provider]  PREVIDENT 5000 BOOSTER PLUS 1.1 % PSTE Place 1 application  onto teeth at bedtime. 08/14/23   [provider]  triamcinolone  cream (KENALOG ) 0.1 % Apply 1 Application topically daily as needed (irritation). 12/24/23   Allegra Arch, PA-C  vitamin E 1000 UNIT capsule Take 1,000 Units by mouth daily.    [provider]    Family History Family History  Problem Relation Age of Onset   Heart disease Mother    Cancer Father    Diabetes Father    Stroke Maternal Grandmother    Diabetes Maternal Grandmother    Colon cancer Neg Hx    Esophageal cancer Neg Hx    Rectal cancer Neg Hx    Stomach cancer Neg Hx     Social History Social History   Tobacco Use   Smoking status: Never   Smokeless tobacco: Never  Vaping Use   Vaping status: Never Used  Substance Use Topics   Alcohol use: Yes    Comment: occasionally   Drug use: No     Allergies   Patient has no known allergies.   Review of Systems Review of Systems  Neurological:  Positive for headaches.     Physical Exam Triage Vital Signs ED Triage Vitals  Encounter Vitals Group     BP 03/29/24 1341 (!) 160/73     Systolic BP Percentile --      Diastolic BP Percentile --      Pulse Rate 03/29/24 1341 65     Resp 03/29/24 1341 18     Temp 03/29/24 1341 98.5 F (36.9 C)     Temp Source 03/29/24 1341 Oral     SpO2 03/29/24 1341 97 %     Weight 03/29/24 1341 196 lb 10.4 oz (89.2 kg)     Height --      Head Circumference --      Peak Flow --      Pain Score 03/29/24 1340 8     Pain Loc --      Pain Education --      Exclude from  Growth Chart --    No data found.  Updated Vital Signs BP (!) 160/73 (BP Location: Left Arm)   Pulse 65   Temp 98.5 F (36.9 C) (Oral)   Resp 18   Wt 196 lb 10.4 oz (89.2 kg)   SpO2 97%   BMI 30.80 kg/m   Visual Acuity Right Eye Distance:   Left  Eye Distance:   Bilateral Distance:    Right Eye Near:   Left Eye Near:    Bilateral Near:     Physical Exam Vitals reviewed.  Constitutional:      Appearance: He is well-developed.  HENT:     Head: Normocephalic and atraumatic.     Right Ear: Tympanic membrane, ear canal and external ear normal.     Left Ear: Tympanic membrane, ear canal and external ear normal.     Nose: Congestion and rhinorrhea present.     Mouth/Throat:     Pharynx: Postnasal drip present. No oropharyngeal exudate.  Cardiovascular:     Rate and Rhythm: Normal rate and regular rhythm.  Pulmonary:     Effort: Pulmonary effort is normal.     Breath sounds: Normal breath sounds.  Musculoskeletal:     Cervical back: Normal range of motion.  Skin:    General: Skin is warm.  Neurological:     General: No focal deficit present.     Mental Status: He is alert and oriented to person, place, and time.      UC Treatments / Results  Labs (all labs ordered are listed, but only abnormal results are displayed) Labs Reviewed - No data to display  EKG   Radiology No results found.  Procedures Procedures (including critical care time)  Medications Ordered in UC Medications - No data to display  Initial Impression / Assessment and Plan / UC Course  I have reviewed the triage vital signs and the nursing notes.  Pertinent labs & imaging results that were available during my care of the patient were reviewed by me and considered in my medical decision making (see chart for details).    Acute none recurrent frontal sinusitis empiric treatment with amoxicillin 875 twice daily for 7 days.  Advised patient to continue loratadine 10 mg daily for rhinitis  times. Return precautions symptoms worsen or do not improve. Final Clinical Impressions(s) / UC Diagnoses   Final diagnoses:  Acute non-recurrent frontal sinusitis     Discharge Instructions      Loratadine 10 mg daily for nasal symptoms . Amoxicillin 875 mg twice  daily for 7 days . Follow-up with PCP as needed.   ED Prescriptions     Medication Sig Dispense Auth. Provider   amoxicillin (AMOXIL) 875 MG tablet Take 1 tablet (875 mg total) by mouth 2 (two) times daily for 7 days. 14 tablet Buena Carmine, NP      PDMP not reviewed this encounter.   Buena Carmine, NP 03/29/24 [phone]

## 2024-03-29 NOTE — Discharge Instructions (Addendum)
 Loratadine 10 mg daily for nasal symptoms . Amoxicillin 875 mg twice  daily for 7 days . Follow-up with PCP as needed.

## 2024-03-29 NOTE — ED Triage Notes (Signed)
 Pt c/o sinus congestion on left side of nose for about 5 weeks. Pt says he is now feeling a little tenderness right above his left eyebrow. Pt denies cough.

## 2024-04-01 ENCOUNTER — Encounter (HOSPITAL_COMMUNITY)
Admission: RE | Admit: 2024-04-01 | Discharge: 2024-04-01 | Disposition: A | Discharge status: Home / Self Care | Source: Ambulatory Visit | Attending: Cardiovascular Disease | Admitting: Cardiovascular Disease

## 2024-04-01 DIAGNOSIS — Z951 Presence of aortocoronary bypass graft: Secondary | ICD-10-CM | POA: Diagnosis not present

## 2024-04-02 ENCOUNTER — Other Ambulatory Visit: Payer: Self-pay

## 2024-04-02 MED ORDER — CLOPIDOGREL BISULFATE 75 MG PO TABS: 75.0000 mg | ORAL_TABLET | Freq: Every day | ORAL | 3 refills | Status: DC

## 2024-04-03 ENCOUNTER — Encounter (HOSPITAL_COMMUNITY)
Admission: RE | Admit: 2024-04-03 | Discharge: 2024-04-03 | Disposition: A | Discharge status: Home / Self Care | Source: Ambulatory Visit | Attending: Cardiovascular Disease | Admitting: Cardiovascular Disease

## 2024-04-03 DIAGNOSIS — Z951 Presence of aortocoronary bypass graft: Secondary | ICD-10-CM

## 2024-04-03 NOTE — Progress Notes (Signed)
 Cardiac Individual Treatment Plan  Patient Details  Name: Terry Vega MRN: [id number] Date of Birth: July 30, 1946 Referring Provider:   Flowsheet Row INTENSIVE CARDIAC REHAB ORIENT from 01/22/2024 in [facility]  Referring Provider Antionette Kirks, MD       Initial Encounter Date:  Flowsheet Row INTENSIVE CARDIAC REHAB ORIENT from 01/22/2024 in [facility] for [facility], V[facility], [facility]  Date 01/22/24       Visit Diagnosis: 12/20/23 CABG x 5  Patient's Home Medications on Admission:  Current Outpatient Medications:    amoxicillin  (AMOXIL ) 875 MG tablet, Take 1 tablet (875 mg total) by mouth 2 (two) times daily for 7 days., Disp: 14 tablet, Rfl: 0   aspirin  EC 81 MG tablet, Take 1 tablet (81 mg total) by mouth daily. Swallow whole., Disp: , Rfl:    atorvastatin  (LIPITOR) 40 MG tablet, Take 1 tablet (40 mg total) by mouth daily., Disp: 30 tablet, Rfl: 6   carvedilol  (COREG ) 6.25 MG tablet, Take 1 tablet (6.25 mg total) by mouth 2 (two) times daily., Disp: 180 tablet, Rfl: 3   cholecalciferol (VITAMIN D ) 1000 UNITS tablet, Take 1,000 Units by mouth daily., Disp: , Rfl:    clopidogrel  (PLAVIX ) 75 MG tablet, Take 1 tablet (75 mg total) by mouth daily., Disp: 90 tablet, Rfl: 3   ezetimibe  (ZETIA ) 10 MG tablet, Take 1 tablet (10 mg total) by mouth daily., Disp: 90 tablet, Rfl: 3   isosorbide  mononitrate (IMDUR ) 60 MG 24 hr tablet, Take 1 tablet (60 mg total) by mouth daily., Disp: 90 tablet, Rfl: 1   levothyroxine  (SYNTHROID ) 100 MCG tablet, Take 1 tablet (100 mcg total) by mouth daily., Disp: 90 tablet, Rfl: 3   Multiple Vitamin (MULTIVITAMIN) tablet, Take 1 tablet by mouth daily., Disp: , Rfl:    nitroGLYCERIN  (NITROSTAT ) 0.4 MG SL tablet, Place 0.4 mg under the tongue every 5 (five) minutes as needed for chest pain., Disp: , Rfl:    PREVIDENT 5000 BOOSTER PLUS 1.1 % PSTE, Place 1 application  onto teeth  at bedtime., Disp: , Rfl:    triamcinolone  cream (KENALOG ) 0.1 %, Apply 1 Application topically daily as needed (irritation)., Disp: , Rfl:    vitamin E 1000 UNIT capsule, Take 1,000 Units by mouth daily., Disp: , Rfl:   Past Medical History: Past Medical History:  Diagnosis Date   Allergy    Arthritis    [employer] 07/21/2020   CAD (coronary artery disease)    a. 11/2023 Cath: LM 62m/d, LAD 70p/m, D1 99, D2 80, LCX 70ost, 27m/d, OM2 90, RCA 40p/m, 90d, EF 55-65%; b. 12/2023 CABG x 5: LIMA->LAD, VG->D1, VG->D2, VG->OM, VG->dRCA.   Diastolic dysfunction    a. 07/2021 Echo: EF 60-65%.  Mild LVH.  Grade 1 DD.  Normal RV fxn. mildly dil LA. Triv MR/AI. Mild AoV sclerosis.   Heart murmur    Hypertension    Hypothyroidism    PVC's (premature ventricular contractions)     Tobacco Use: Social History   Tobacco Use  Smoking Status Never  Smokeless Tobacco Never    Labs: Review Flowsheet  More data exists      Latest Ref Rng & Units 08/01/2022 08/04/2023 12/18/2023 12/19/2023 03/07/2024  Labs for ITP Cardiac and Pulmonary Rehab  Cholestrol 100 - 199 mg/dL 409  811  - - 914   LDL (calc) 0 - 99 mg/dL 782  91  - - 62   HDL-C >  39 mg/dL 19.14  78.29  - - 35   Trlycerides 0 - 149 mg/dL 562.1  30.8  - - 77   Hemoglobin A1c 4.8 - 5.6 % - - 5.2  - -  PH, Arterial 7.35 - 7.45 - - 7.45  7.340  7.336  7.406  7.431  7.408  7.402  7.358  7.382  -  PCO2 arterial 32 - 48 mmHg - - 43  44.8  45.6  38.2  37.8  40.3  42.0  48.2  43.9  -  Bicarbonate 20.0 - 28.0 mmol/L - - 29.9  24.0  24.2  24.3  25.1  25.5  26.1  26.2  27.1  26.1  -  TCO2 22 - 32 mmol/L - - - 25  26  26  26  27  27  25  27  27  28  29  26  27  29   -  Acid-base deficit 0.0 - 2.0 mmol/L - - - 2.0  2.0  1.0  -  O2 Saturation % - - 97.2  98  98  93  100  100  100  84  100  100  -    Details       Multiple values from one day are sorted in reverse-chronological order         Capillary Blood Glucose: Lab Results  Component Value Date    GLUCAP 141 (H) 12/21/2023   GLUCAP 96 12/21/2023   GLUCAP 115 (H) 12/20/2023   GLUCAP 142 (H) 12/20/2023   GLUCAP 115 (H) 12/20/2023     Exercise Target Goals: Exercise Program Goal: Individual exercise prescription set using results from initial 6 min walk test and THRR while considering  patient's activity barriers and safety.   Exercise Prescription Goal: Initial exercise prescription builds to 30-45 minutes a day of aerobic activity, 2-3 days per week.  Home exercise guidelines will be given to patient during program as part of exercise prescription that the participant will acknowledge.  Activity Barriers & Risk Stratification:  Activity Barriers & Cardiac Risk Stratification - 01/22/24 1409       Activity Barriers & Cardiac Risk Stratification   Activity Barriers Incisional Pain;Balance Concerns;Joint Problems    Cardiac Risk Stratification High   <5 METs on            6 Minute Walk:  6 Minute Walk     Row Name 01/22/24 1539         6 Minute Walk   Phase Initial     Distance 1420 feet     Walk Time 6 minutes     # of Rest Breaks 0     MPH 2.69     METS 2.6     RPE 11     Perceived Dyspnea  0     VO2 Peak 9.09     Symptoms No     Resting HR 75 bpm     Resting BP 106/62     Resting Oxygen Saturation  95 %     Exercise Oxygen Saturation  during 6 min walk 93 %     Max Ex. HR 108 bpm     Max Ex. BP 142/64     2 Minute Post BP 112/68              Oxygen Initial Assessment:   Oxygen Re-Evaluation:   Oxygen Discharge (Final Oxygen Re-Evaluation):   Initial Exercise Prescription:  Initial Exercise Prescription - 01/22/24  1500       Date of Initial Exercise RX and Referring Provider   Date 01/22/24    Referring Provider Antionette Kirks, MD    Expected Discharge Date 04/17/24      Recumbant Bike   Level 1    RPM 50    Watts 30    Minutes 15    METs 2      NuStep   Level 2    SPM 75    Minutes 15    METs 2.3      Prescription  Details   Frequency (times per week) 3    Duration Progress to 30 minutes of continuous aerobic without signs/symptoms of physical distress      Intensity   THRR 40-80% of Max Heartrate 56-113    Ratings of Perceived Exertion 11-13    Perceived Dyspnea 0-4      Progression   Progression Continue progressive overload as per policy without signs/symptoms or physical distress.      Resistance Training   Training Prescription Yes    Weight 3    Reps 10-15             Perform Capillary Blood Glucose checks as needed.  Exercise Prescription Changes:   Exercise Prescription Changes     Row Name 02/19/24 1648 03/13/24 1654 04/01/24 1652         Response to Exercise   Blood Pressure (Admit) 130/78 112/62 122/56     Blood Pressure (Exercise) 130/70 128/78 --     Blood Pressure (Exit) 120/62 126/68 120/60     Heart Rate (Admit) 65 bpm 67 bpm 70 bpm     Heart Rate (Exercise) 78 bpm 90 bpm 100 bpm     Heart Rate (Exit) 74 bpm 69 bpm 71 bpm     Rating of Perceived Exertion (Exercise) 11 11 10      Perceived Dyspnea (Exercise) 0 0 0     Symptoms none none none     Comments Pt first day in the [employer] program. Reviewed MET's, goals and home exRx REVD MET's     Duration Progress to 30 minutes of  aerobic without signs/symptoms of physical distress Progress to 30 minutes of  aerobic without signs/symptoms of physical distress Progress to 30 minutes of  aerobic without signs/symptoms of physical distress     Intensity THRR unchanged THRR unchanged THRR unchanged       Progression   Progression Continue to progress workloads to maintain intensity without signs/symptoms of physical distress. Continue to progress workloads to maintain intensity without signs/symptoms of physical distress. Continue to progress workloads to maintain intensity without signs/symptoms of physical distress.     Average METs 1.8 2.04 2.42       Resistance Training   Training Prescription Yes No Yes      Weight 3 3 3      Reps 10-15 10-15 10-15     Time 10 Minutes 10 Minutes 10 Minutes       Treadmill   MPH -- 1.4 1.7     Grade -- 0 0     Minutes -- 15 15     METs -- 2.07 2.3       NuStep   Level 2 2 2      SPM 66 70 105     Minutes 15 15 15      METs 1.8 2 2.8       Home Exercise Plan   Plans to continue exercise at --  Home (comment) Home (comment)     Frequency -- Add 3 additional days to program exercise sessions. Add 3 additional days to program exercise sessions.     Initial Home Exercises Provided -- 03/13/24 03/13/24              Exercise Comments:   Exercise Comments     Row Name 02/19/24 1653 03/13/24 1700 04/01/24 1654       Exercise Comments Pt first day in the [facility] program. Pt tolerated exercise well with an average MET level of 1.8. Pt is off to a good start and is leanring his THRR, RPE and ExRx Reviewed MET's, goals and home ExRx. Pt tolerated exercise well with an average MET level of 2.04. Pt is is doing well, switched today to two modalities. Added TM today. He is already doing some exercise at home by walking, he will add more time by using his seated stepper and stretches. The next goal is to get to 30 mins, 2-3 days on his own. He is feeling and increase in strength stamina and is also having successful weight loss Reviewed MET's and goals. Pt tolerated exercise well with an average MET level of 2.42. He has been doing well with his exercise and making gradual increases which has increase his MET's. He is feeling an increase in strength and stamina and is already under his initial goal weight.              Exercise Goals and Review:   Exercise Goals     Row Name 01/22/24 1409             Exercise Goals   Increase Physical Activity Yes       Intervention Provide advice, education, support and counseling about physical activity/exercise needs.;Develop an individualized exercise prescription for aerobic and resistive training based on  initial evaluation findings, risk stratification, comorbidities and participant's personal goals.       Expected Outcomes Short Term: Attend rehab on a regular basis to increase amount of physical activity.;Long Term: Exercising regularly at least 3-5 days a week.;Long Term: Add in home exercise to make exercise part of routine and to increase amount of physical activity.       Increase Strength and Stamina Yes       Intervention Provide advice, education, support and counseling about physical activity/exercise needs.;Develop an individualized exercise prescription for aerobic and resistive training based on initial evaluation findings, risk stratification, comorbidities and participant's personal goals.       Expected Outcomes Short Term: Increase workloads from initial exercise prescription for resistance, speed, and METs.;Short Term: Perform resistance training exercises routinely during rehab and add in resistance training at home;Long Term: Improve cardiorespiratory fitness, muscular endurance and strength as measured by increased METs and functional capacity ( )       Able to understand and use rate of perceived exertion (RPE) scale Yes       Intervention Provide education and explanation on how to use RPE scale       Expected Outcomes Short Term: Able to use RPE daily in rehab to express subjective intensity level;Long Term:  Able to use RPE to guide intensity level when exercising independently       Knowledge and understanding of Target Heart Rate Range (THRR) Yes       Intervention Provide education and explanation of THRR including how the numbers were predicted and where they are located for reference       Expected Outcomes Short  Term: Able to state/look up THRR;Short Term: Able to use daily as guideline for intensity in rehab;Long Term: Able to use THRR to govern intensity when exercising independently       Understanding of Exercise Prescription Yes       Intervention Provide education,  explanation, and written materials on patient's individual exercise prescription       Expected Outcomes Short Term: Able to explain program exercise prescription;Long Term: Able to explain home exercise prescription to exercise independently                Exercise Goals Re-Evaluation :  Exercise Goals Re-Evaluation     Row Name 02/19/24 1651 03/13/24 1658 04/01/24 1656         Exercise Goal Re-Evaluation   Exercise Goals Review Increase Physical Activity;Understanding of Exercise Prescription;Increase Strength and Stamina;Knowledge and understanding of Target Heart Rate Range (THRR);Able to understand and use rate of perceived exertion (RPE) scale Increase Physical Activity;Understanding of Exercise Prescription;Increase Strength and Stamina;Knowledge and understanding of Target Heart Rate Range (THRR);Able to understand and use rate of perceived exertion (RPE) scale Increase Physical Activity;Understanding of Exercise Prescription;Increase Strength and Stamina;Knowledge and understanding of Target Heart Rate Range (THRR);Able to understand and use rate of perceived exertion (RPE) scale     Comments Pt first day in the [facility] program. Pt tolerated exercise well with an average MET level of 1.8. Pt is off to a good start and is leanring his THRR, RPE and ExRx Reviewed MET's, goals and home ExRx. Pt tolerated exercise well with an average MET level of 2.04. Pt is is doing well, switched today to two modalities. Added TM today. He is already doing some exercise at home by walking, he will add more time by using his seated stepper and stretches. The next goal is to get to 30 mins, 2-3 days on his own Reviewed MET's and goals. Pt tolerated exercise well with an average MET level of 2.42. He has been doing well with his exercise and making gradual increases which has increase his MET's. He is feeling an increase in strength and stamina and is already under his initial goal weight.     Expected  Outcomes Will continue to monitor pt and progress workloads as tolerated without sign or symptom Will continue to monitor pt and progress workloads as tolerated without sign or symptom Will continue to monitor pt and progress workloads as tolerated without sign or symptom              Discharge Exercise Prescription (Final Exercise Prescription Changes):  Exercise Prescription Changes - 04/01/24 1652       Response to Exercise   Blood Pressure (Admit) 122/56    Blood Pressure (Exit) 120/60    Heart Rate (Admit) 70 bpm    Heart Rate (Exercise) 100 bpm    Heart Rate (Exit) 71 bpm    Rating of Perceived Exertion (Exercise) 10    Perceived Dyspnea (Exercise) 0    Symptoms none    Comments REVD MET's    Duration Progress to 30 minutes of  aerobic without signs/symptoms of physical distress    Intensity THRR unchanged      Progression   Progression Continue to progress workloads to maintain intensity without signs/symptoms of physical distress.    Average METs 2.42      Resistance Training   Training Prescription Yes    Weight 3    Reps 10-15    Time 10 Minutes  Treadmill   MPH 1.7    Grade 0    Minutes 15    METs 2.3      NuStep   Level 2    SPM 105    Minutes 15    METs 2.8      Home Exercise Plan   Plans to continue exercise at Home (comment)    Frequency Add 3 additional days to program exercise sessions.    Initial Home Exercises Provided 03/13/24             Nutrition:  Target Goals: Understanding of nutrition guidelines, daily intake of sodium 1500mg , cholesterol 200mg , calories 30% from fat and 7% or less from saturated fats, daily to have 5 or more servings of fruits and vegetables.  Biometrics:  Pre Biometrics - 01/22/24 1407       Pre Biometrics   Waist Circumference 42.5 inches    Hip Circumference 43.5 inches    Waist to Hip Ratio 0.98 %    Triceps Skinfold 23 mm    % Body Fat 32.4 %    Grip Strength 36 kg    Flexibility 11 in     Single Leg Stand 1.25 seconds              Nutrition Therapy Plan and Nutrition Goals:  Nutrition Therapy & Goals - 03/21/24 1503       Nutrition Therapy   Diet Heart Healthy Diet    Drug/Food Interactions Statins/Certain Fruits      Personal Nutrition Goals   Nutrition Goal Patient to identify strategies for reducing cardiovascular risk by attending the [facility] education and nutrition series weekly.   goal in progress.   Personal Goal #2 Patient to improve diet quality by using the plate method as a guide for meal planning to include lean protein/plant protein, fruits, vegetables, whole grains, nonfat dairy as part of a well-balanced diet.   goal in progress.   Personal Goal #3 Patient to identify strategies for weight loss of 0.5-2.0# per week   goal in progress.   Comments Goals in progress. Terry Vega has medical history of CAD, CABGx5, HTN, hyperlipidemia. He is already started making many heart healthy dietary changes. His wife is an excellent support. He is motivated to lose weight and is down 6.4# since orientation to our program. LDL is not at goal of <55; declined PCSK9i injectables and opted to start ezetimibe  per documentation on 4/29. Patient will benefit from participation in intensive cardiac rehab for nutrition, exercise, and lifestyle modification.      Intervention Plan   Intervention Prescribe, educate and counsel regarding individualized specific dietary modifications aiming towards targeted core components such as weight, hypertension, lipid management, diabetes, heart failure and other comorbidities.;Nutrition handout(s) given to patient.    Expected Outcomes Short Term Goal: Understand basic principles of dietary content, such as calories, fat, sodium, cholesterol and nutrients.;Long Term Goal: Adherence to prescribed nutrition plan.             Nutrition Assessments:  Nutrition Assessments - 02/21/24 1630       Rate Your Plate Scores   Pre Score 56             MEDIFICTS Score Key: >=70 Need to make dietary changes  40-70 Heart Healthy Diet <= 40 Therapeutic Level Cholesterol Diet   Flowsheet Row INTENSIVE CARDIAC REHAB from 02/21/2024 in [facility] for [facility], & Lung Health  Picture Your Plate Total Score on Admission 56  Picture Your Plate Scores: <69 Unhealthy dietary pattern with much room for improvement. 41-50 Dietary pattern unlikely to meet recommendations for good health and room for improvement. 51-60 More healthful dietary pattern, with some room for improvement.  >60 Healthy dietary pattern, although there may be some specific behaviors that could be improved.    Nutrition Goals Re-Evaluation:  Nutrition Goals Re-Evaluation     Row Name 02/20/24 1148 03/21/24 1503           Goals   Current Weight 195 lb 15.8 oz (88.9 kg) 195 lb 12.3 oz (88.8 kg)      Comment A1c WNL, Lpa 175.9kg, lipids WNL, LDl 91 LDL 62, HDL 35. Other most recent labs 175.9, A1c WNL      Expected Outcome Terry Vega has medical history of CAD, CABGx5, HTN, hyperlipidemia. He is already started making many heart healthy dietary changes. His wife is an excellent support. He is motivated to lose weight and is down 6# since orientation to our program. LDL is not at goal of <55. Patient will benefit from participation in intensive cardiac rehab for nutrition, exercise, and lifestyle modification. Goals in progress. Terry Vega has medical history of CAD, CABGx5, HTN, hyperlipidemia. He is already started making many heart healthy dietary changes. His wife is an excellent support. He is motivated to lose weight and is down 6.4# since orientation to our program. LDL is not at goal of <55; declined PCSK9i injectables and opted to start ezetimibe  per documentation on 4/29. Patient will benefit from participation in intensive cardiac rehab for nutrition, exercise, and lifestyle modification.               Nutrition Goals  Re-Evaluation:  Nutrition Goals Re-Evaluation     Row Name 02/20/24 1148 03/21/24 1503           Goals   Current Weight 195 lb 15.8 oz (88.9 kg) 195 lb 12.3 oz (88.8 kg)      Comment A1c WNL, Lpa 175.9kg, lipids WNL, LDl 91 LDL 62, HDL 35. Other most recent labs 175.9, A1c WNL      Expected Outcome Terry Vega has medical history of CAD, CABGx5, HTN, hyperlipidemia. He is already started making many heart healthy dietary changes. His wife is an excellent support. He is motivated to lose weight and is down 6# since orientation to our program. LDL is not at goal of <55. Patient will benefit from participation in intensive cardiac rehab for nutrition, exercise, and lifestyle modification. Goals in progress. Terry Vega has medical history of CAD, CABGx5, HTN, hyperlipidemia. He is already started making many heart healthy dietary changes. His wife is an excellent support. He is motivated to lose weight and is down 6.4# since orientation to our program. LDL is not at goal of <55; declined PCSK9i injectables and opted to start ezetimibe  per documentation on 4/29. Patient will benefit from participation in intensive cardiac rehab for nutrition, exercise, and lifestyle modification.               Nutrition Goals Discharge (Final Nutrition Goals Re-Evaluation):  Nutrition Goals Re-Evaluation - 03/21/24 1503       Goals   Current Weight 195 lb 12.3 oz (88.8 kg)    Comment LDL 62, HDL 35. Other most recent labs 175.9, A1c WNL    Expected Outcome Goals in progress. Terry Vega has medical history of CAD, CABGx5, HTN, hyperlipidemia. He is already started making many heart healthy dietary changes. His wife is an excellent support. He is motivated to lose weight  and is down 6.4# since orientation to our program. LDL is not at goal of <55; declined PCSK9i injectables and opted to start ezetimibe  per documentation on 4/29. Patient will benefit from participation in intensive cardiac rehab for nutrition, exercise, and  lifestyle modification.             Psychosocial: Target Goals: Acknowledge presence or absence of significant depression and/or stress, maximize coping skills, provide positive support system. Participant is able to verbalize types and ability to use techniques and skills needed for reducing stress and depression.  Initial Review & Psychosocial Screening:  Initial Psych Review & Screening - 01/22/24 1409       Initial Review   Current issues with None Identified      Family Dynamics   Good Support System? Yes   Wife for support   Comments Terry Vega denies any feelings of anxiety/stress/depression. He did share that he is waking up every 2 hours at night to use the restroom, he said it has become less frequent the past few weeks and he has no trouble falling back asleep. Denies any need for additional resources at this time.      Barriers   Psychosocial barriers to participate in program There are no identifiable barriers or psychosocial needs.      Screening Interventions   Interventions Encouraged to exercise;Provide feedback about the scores to participant    Expected Outcomes Short Term goal: Identification and review with participant of any Quality of Life or Depression concerns found by scoring the questionnaire.;Long Term goal: The participant improves quality of Life and PHQ9 Scores as seen by post scores and/or verbalization of changes             Quality of Life Scores:  Quality of Life - 01/22/24 1542       Quality of Life   Select Quality of Life      Quality of Life Scores   Health/Function Pre 26.2 %    Socioeconomic Pre 28.21 %    Psych/Spiritual Pre 27.21 %    Family Pre 28.8 %    GLOBAL Pre 27.21 %            Scores of 19 and below usually indicate a poorer quality of life in these areas.  A difference of  2-3 points is a clinically meaningful difference.  A difference of 2-3 points in the total score of the Quality of Life Index has been associated  with significant improvement in overall quality of life, self-image, physical symptoms, and general health in studies assessing change in quality of life.  PHQ-9: Review Flowsheet  More data exists      01/22/2024 08/04/2023 05/31/2023 09/01/2022 07/28/2021  Depression screen PHQ 2/9  Decreased Interest 0 0 0 0 0  Down, Depressed, Hopeless 0 0 0 0 0  PHQ - 2 Score 0 0 0 0 0  Altered sleeping 1 0 0 - 0  Tired, decreased energy 1 1 0 - 0  Change in appetite 1 0 0 - 0  Feeling bad or failure about yourself  0 0 0 - 0  Trouble concentrating 0 0 0 - 0  Moving slowly or fidgety/restless 0 0 0 - 0  Suicidal thoughts 0 0 0 - 0  PHQ-9 Score 3 1 0 - 0  Difficult doing work/chores Not difficult at all Not difficult at all Not difficult at all - -   Interpretation of Total Score  Total Score Depression Severity:  1-4 = Minimal  depression, 5-9 = Mild depression, 10-14 = Moderate depression, 15-19 = Moderately severe depression, 20-27 = Severe depression   Psychosocial Evaluation and Intervention:   Psychosocial Re-Evaluation:  Psychosocial Re-Evaluation     Row Name 02/20/24 [phone] 03/05/24 1712 04/03/24 0739         Psychosocial Re-Evaluation   Current issues with Current Stress Concerns Current Stress Concerns None Identified     Comments Terry Vega has stress concerns regarding his health and continued angina. Will discuss PH9 in the upcoming week Reviewed  PH9 Terry Vega says that he does not feel as depressed. Terry Vega also reports that he is sleeping better. Terry Vega says his energy level has also improved. Terry Vega has not voiced any increased concerns or stressors during exercise at cardiac rehab     Expected Outcomes Terry Vega will have controlled or decreased stress upon compleiton of cardiac rehab Terry Vega will have controlled or decreased stress upon compleiton of cardiac rehab Terry Vega will have controlled or decreased stress upon compleiton of cardiac rehab     Interventions Encouraged to attend Cardiac Rehabilitation  for the exercise;Stress management education;Relaxation education Encouraged to attend Cardiac Rehabilitation for the exercise;Stress management education;Relaxation education Encouraged to attend Cardiac Rehabilitation for the exercise;Stress management education;Relaxation education     Continue Psychosocial Services  Follow up required by staff No Follow up required No Follow up required       Initial Review   Source of Stress Concerns Chronic Illness Chronic Illness Chronic Illness     Comments Will continiue to monitor and offer support as needed. Will continiue to monitor and offer support as needed. Will continiue to monitor and offer support as needed.              Psychosocial Discharge (Final Psychosocial Re-Evaluation):  Psychosocial Re-Evaluation - 04/03/24 0739       Psychosocial Re-Evaluation   Current issues with None Identified    Comments Terry Vega has not voiced any increased concerns or stressors during exercise at cardiac rehab    Expected Outcomes Terry Vega will have controlled or decreased stress upon compleiton of cardiac rehab    Interventions Encouraged to attend Cardiac Rehabilitation for the exercise;Stress management education;Relaxation education    Continue Psychosocial Services  No Follow up required      Initial Review   Source of Stress Concerns Chronic Illness    Comments Will continiue to monitor and offer support as needed.             Vocational Rehabilitation: Provide vocational rehab assistance to qualifying candidates.   Vocational Rehab Evaluation & Intervention:  Vocational Rehab - 01/22/24 1542       Initial Vocational Rehab Evaluation & Intervention   Assessment shows need for Vocational Rehabilitation No   Terry Vega is retired            Education: Education Goals: Education classes will be provided on a weekly basis, covering required topics. Participant will state understanding/return demonstration of topics presented.    Education      Row Name 02/19/24 1500     Education   Cardiac Education Topics Pritikin   Glass blower/designer Nutrition   Nutrition Workshop Fueling a Forensic psychologist   Instruction Review Code 1- Tax inspector   Class Start Time 1400   Class Stop Time 1440   Class Time Calculation (min) 40 min    Row Name 02/21/24 1500     Education   Cardiac Education Topics  Pritikin   Barista Cuisine- Spotlight on the [employer]   Instruction Review Code 1- Verbalizes Understanding   Class Start Time 1400   Class Stop Time 1440   Class Time Calculation (min) 40 min    Row Name 02/26/24 1700     Education   Cardiac Education Topics Pritikin   [employer]     Workshops   Educator Exercise Physiologist   [facility] Psychosocial   Psychosocial Workshop Healthy Sleep for a Healthy Heart   Instruction Review Code 1- Verbalizes Understanding   Class Start Time 1403   Class Stop Time 1457   Class Time Calculation (min) 54 min    Row Name 02/28/24 1500     Education   Cardiac Education Topics Pritikin   Customer service manager   Weekly Topic Simple Sides and Sauces   Instruction Review Code 1- Verbalizes Understanding   Class Start Time 1355   Class Stop Time 1431   Class Time Calculation (min) 36 min    Row Name 03/04/24 1500     Education   Cardiac Education Topics Pritikin   [employer] Workshops     Workshops   Educator Exercise Physiologist   [facility] Exercise   Exercise Workshop Managing Heart Disease: Your Path to a Healthier Heart   Instruction Review Code 1- Verbalizes Understanding   Class Start Time 1400   Class Stop Time 1448   Class Time Calculation (min) 48 min    Row Name 03/06/24 1500     Education   Cardiac Education Topics Pritikin   Optometrist   Weekly Topic Powerhouse Plant-Based Proteins   Instruction Review Code 1- Verbalizes Understanding   Class Start Time 1400   Class Stop Time 1435   Class Time Calculation (min) 35 min    Row Name 03/11/24 1600     Education   Cardiac Education Topics Pritikin   Geographical information systems officer Psychosocial   Psychosocial Workshop From Head to Heart: The Power of a Healthy Outlook   Instruction Review Code 1- Verbalizes Understanding   Class Start Time 1400   Class Stop Time 1449   Class Time Calculation (min) 49 min    Row Name 03/13/24 1600     Education   Cardiac Education Topics Pritikin   Customer service manager   Weekly Topic Tasty Appetizers and Snacks   Instruction Review Code 1- Verbalizes Understanding   Class Start Time 1400   Class Stop Time 1445   Class Time Calculation (min) 45 min    Row Name 03/18/24 1400     Education   Cardiac Education Topics Pritikin   Nurse, children's Exercise Physiologist   [employer] Psychosocial   Psychosocial Healthy Minds, Bodies, Hearts   Instruction Review Code 1- Verbalizes Understanding   Class Start Time 1410   Class Stop Time 1445   Class Time Calculation (min) 35 min    Row Name 03/20/24 1500     Education   Cardiac Education Topics Pritikin   Customer service manager   Weekly  Topic Adding Flavor - Sodium-Free   Instruction Review Code 1- Verbalizes Understanding   Class Start Time 1400   Class Stop Time 1440   Class Time Calculation (min) 40 min    Row Name 03/25/24 1500     Education   Cardiac Education Topics Pritikin     Workshops   Educator Dietitian   Nutrition Workshop Label Reading   Instruction Review Code 1- Verbalizes Understanding   Class Start Time 1400   Class Stop Time 1437   Class Time Calculation (min) 37 min    Row Name  03/27/24 1400     Education   Cardiac Education Topics Pritikin   Nurse, children's Exercise Physiologist   [facility] Nutrition   Nutrition Other  Label reading   Instruction Review Code 1- Verbalizes Understanding   Class Start Time 1350   Class Stop Time 1435   Class Time Calculation (min) 45 min    Row Name 04/01/24 1400     Education   Cardiac Education Topics Pritikin   Hospital doctor Education   General Education Metabolic Syndrome and Belly Fat   Instruction Review Code 1- Verbalizes Understanding   Class Start Time 1355   Class Stop Time 1440   Class Time Calculation (min) 45 min            Core Videos: Exercise    Move It!  Clinical staff conducted group or individual video education with verbal and written material and guidebook.  Patient learns the recommended Pritikin exercise program. Exercise with the goal of living a long, healthy life. Some of the health benefits of exercise include controlled diabetes, healthier blood pressure levels, improved cholesterol levels, improved heart and lung capacity, improved sleep, and better body composition. Everyone should speak with their doctor before starting or changing an exercise routine.  Biomechanical Limitations Clinical staff conducted group or individual video education with verbal and written material and guidebook.  Patient learns how biomechanical limitations can impact exercise and how we can mitigate and possibly overcome limitations to have an impactful and balanced exercise routine.  Body Composition Clinical staff conducted group or individual video education with verbal and written material and guidebook.  Patient learns that body composition (ratio of muscle mass to fat mass) is a key component to assessing overall fitness, rather than body weight alone. Increased fat mass, especially visceral belly fat,  can put us  at increased risk for metabolic syndrome, type 2 diabetes, heart disease, and even death. It is recommended to combine diet and exercise (cardiovascular and resistance training) to improve your body composition. Seek guidance from your physician and exercise physiologist before implementing an exercise routine.  Exercise Action Plan Clinical staff conducted group or individual video education with verbal and written material and guidebook.  Patient learns the recommended strategies to achieve and enjoy long-term exercise adherence, including variety, self-motivation, self-efficacy, and positive decision making. Benefits of exercise include fitness, good health, weight management, more energy, better sleep, less stress, and overall well-being.  Medical   Heart Disease Risk Reduction Clinical staff conducted group or individual video education with verbal and written material and guidebook.  Patient learns our heart is our most vital organ as it circulates oxygen, nutrients, white blood cells, and hormones throughout the entire body, and carries waste away. Data supports a plant-based eating plan like the Pritikin Program  for its effectiveness in slowing progression of and reversing heart disease. The video provides a number of recommendations to address heart disease.   Metabolic Syndrome and Belly Fat  Clinical staff conducted group or individual video education with verbal and written material and guidebook.  Patient learns what metabolic syndrome is, how it leads to heart disease, and how one can reverse it and keep it from coming back. You have metabolic syndrome if you have 3 of the following 5 criteria: abdominal obesity, high blood pressure, high triglycerides, low HDL cholesterol, and high blood sugar.  Hypertension and Heart Disease Clinical staff conducted group or individual video education with verbal and written material and guidebook.  Patient learns that high blood pressure,  or hypertension, is very common in the [place] . Hypertension is largely due to excessive salt intake, but other important risk factors include being overweight, physical inactivity, drinking too much alcohol, smoking, and not eating enough potassium from fruits and vegetables. High blood pressure is a leading risk factor for heart attack, stroke, congestive heart failure, dementia, kidney failure, and premature death. Long-term effects of excessive salt intake include stiffening of the arteries and thickening of heart muscle and organ damage. Recommendations include ways to reduce hypertension and the risk of heart disease.  Diseases of Our Time - Focusing on Diabetes Clinical staff conducted group or individual video education with verbal and written material and guidebook.  Patient learns why the best way to stop diseases of our time is prevention, through food and other lifestyle changes. Medicine (such as prescription pills and surgeries) is often only a Band-Aid on the problem, not a long-term solution. Most common diseases of our time include obesity, type 2 diabetes, hypertension, heart disease, and cancer. The Pritikin Program is recommended and has been proven to help reduce, reverse, and/or prevent the damaging effects of metabolic syndrome.  Nutrition   Overview of the Pritikin Eating Plan  Clinical staff conducted group or individual video education with verbal and written material and guidebook.  Patient learns about the Pritikin Eating Plan for disease risk reduction. The Pritikin Eating Plan emphasizes a wide variety of unrefined, minimally-processed carbohydrates, like fruits, vegetables, whole grains, and legumes. Go, Caution, and Stop food choices are explained. Plant-based and lean animal proteins are emphasized. Rationale provided for low sodium intake for blood pressure control, low added sugars for blood sugar stabilization, and low added fats and oils for coronary artery disease  risk reduction and weight management.  Calorie Density  Clinical staff conducted group or individual video education with verbal and written material and guidebook.  Patient learns about calorie density and how it impacts the Pritikin Eating Plan. Knowing the characteristics of the food you choose will help you decide whether those foods will lead to weight gain or weight loss, and whether you want to consume more or less of them. Weight loss is usually a side effect of the Pritikin Eating Plan because of its focus on low calorie-dense foods.  Label Reading  Clinical staff conducted group or individual video education with verbal and written material and guidebook.  Patient learns about the Pritikin recommended label reading guidelines and corresponding recommendations regarding calorie density, added sugars, sodium content, and whole grains.  Dining Out - Part 1  Clinical staff conducted group or individual video education with verbal and written material and guidebook.  Patient learns that restaurant meals can be sabotaging because they can be so high in calories, fat, sodium, and/or sugar. Patient learns recommended strategies  on how to positively address this and avoid unhealthy pitfalls.  Facts on Fats  Clinical staff conducted group or individual video education with verbal and written material and guidebook.  Patient learns that lifestyle modifications can be just as effective, if not more so, as many medications for lowering your risk of heart disease. A Pritikin lifestyle can help to reduce your risk of inflammation and atherosclerosis (cholesterol build-up, or plaque, in the artery walls). Lifestyle interventions such as dietary choices and physical activity address the cause of atherosclerosis. A review of the types of fats and their impact on blood cholesterol levels, along with dietary recommendations to reduce fat intake is also included.  Nutrition Action Plan  Clinical staff  conducted group or individual video education with verbal and written material and guidebook.  Patient learns how to incorporate Pritikin recommendations into their lifestyle. Recommendations include planning and keeping personal health goals in mind as an important part of their success.  Healthy Mind-Set    Healthy Minds, Bodies, Hearts  Clinical staff conducted group or individual video education with verbal and written material and guidebook.  Patient learns how to identify when they are stressed. Video will discuss the impact of that stress, as well as the many benefits of stress management. Patient will also be introduced to stress management techniques. The way we think, act, and feel has an impact on our hearts.  How Our Thoughts Can Heal Our Hearts  Clinical staff conducted group or individual video education with verbal and written material and guidebook.  Patient learns that negative thoughts can cause depression and anxiety. This can result in negative lifestyle behavior and serious health problems. Cognitive behavioral therapy is an effective method to help control our thoughts in order to change and improve our emotional outlook.  Additional Videos:  Exercise    Improving Performance  Clinical staff conducted group or individual video education with verbal and written material and guidebook.  Patient learns to use a non-linear approach by alternating intensity levels and lengths of time spent exercising to help burn more calories and lose more body fat. Cardiovascular exercise helps improve heart health, metabolism, hormonal balance, blood sugar control, and recovery from fatigue. Resistance training improves strength, endurance, balance, coordination, reaction time, metabolism, and muscle mass. Flexibility exercise improves circulation, posture, and balance. Seek guidance from your physician and exercise physiologist before implementing an exercise routine and learn your capabilities  and proper form for all exercise.  Introduction to Yoga  Clinical staff conducted group or individual video education with verbal and written material and guidebook.  Patient learns about yoga, a discipline of the coming together of mind, breath, and body. The benefits of yoga include improved flexibility, improved range of motion, better posture and core strength, increased lung function, weight loss, and positive self-image. Yoga's heart health benefits include lowered blood pressure, healthier heart rate, decreased cholesterol and triglyceride levels, improved immune function, and reduced stress. Seek guidance from your physician and exercise physiologist before implementing an exercise routine and learn your capabilities and proper form for all exercise.  Medical   Aging: Enhancing Your Quality of Life  Clinical staff conducted group or individual video education with verbal and written material and guidebook.  Patient learns key strategies and recommendations to stay in good physical health and enhance quality of life, such as prevention strategies, having an advocate, securing a Health Care Proxy and Power of Attorney, and keeping a list of medications and system for tracking them. It also discusses how  to avoid risk for bone loss.  Biology of Weight Control  Clinical staff conducted group or individual video education with verbal and written material and guidebook.  Patient learns that weight gain occurs because we consume more calories than we burn (eating more, moving less). Even if your body weight is normal, you may have higher ratios of fat compared to muscle mass. Too much body fat puts you at increased risk for cardiovascular disease, heart attack, stroke, type 2 diabetes, and obesity-related cancers. In addition to exercise, following the Pritikin Eating Plan can help reduce your risk.  Decoding Lab Results  Clinical staff conducted group or individual video education with verbal and  written material and guidebook.  Patient learns that lab test reflects one measurement whose values change over time and are influenced by many factors, including medication, stress, sleep, exercise, food, hydration, pre-existing medical conditions, and more. It is recommended to use the knowledge from this video to become more involved with your lab results and evaluate your numbers to speak with your doctor.   Diseases of Our Time - Overview  Clinical staff conducted group or individual video education with verbal and written material and guidebook.  Patient learns that according to the CDC, 50% to 70% of chronic diseases (such as obesity, type 2 diabetes, elevated lipids, hypertension, and heart disease) are avoidable through lifestyle improvements including healthier food choices, listening to satiety cues, and increased physical activity.  Sleep Disorders Clinical staff conducted group or individual video education with verbal and written material and guidebook.  Patient learns how good quality and duration of sleep are important to overall health and well-being. Patient also learns about sleep disorders and how they impact health along with recommendations to address them, including discussing with a physician.  Nutrition  Dining Out - Part 2 Clinical staff conducted group or individual video education with verbal and written material and guidebook.  Patient learns how to plan ahead and communicate in order to maximize their dining experience in a healthy and nutritious manner. Included are recommended food choices based on the type of restaurant the patient is visiting.   Fueling a Banker conducted group or individual video education with verbal and written material and guidebook.  There is a strong connection between our food choices and our health. Diseases like obesity and type 2 diabetes are very prevalent and are in large-part due to lifestyle choices. The  Pritikin Eating Plan provides plenty of food and hunger-curbing satisfaction. It is easy to follow, affordable, and helps reduce health risks.  Menu Workshop  Clinical staff conducted group or individual video education with verbal and written material and guidebook.  Patient learns that restaurant meals can sabotage health goals because they are often packed with calories, fat, sodium, and sugar. Recommendations include strategies to plan ahead and to communicate with the manager, chef, or server to help order a healthier meal.  Planning Your Eating Strategy  Clinical staff conducted group or individual video education with verbal and written material and guidebook.  Patient learns about the Pritikin Eating Plan and its benefit of reducing the risk of disease. The Pritikin Eating Plan does not focus on calories. Instead, it emphasizes high-quality, nutrient-rich foods. By knowing the characteristics of the foods, we choose, we can determine their calorie density and make informed decisions.  Targeting Your Nutrition Priorities  Clinical staff conducted group or individual video education with verbal and written material and guidebook.  Patient learns that lifestyle habits have  a tremendous impact on disease risk and progression. This video provides eating and physical activity recommendations based on your personal health goals, such as reducing LDL cholesterol, losing weight, preventing or controlling type 2 diabetes, and reducing high blood pressure.  Vitamins and Minerals  Clinical staff conducted group or individual video education with verbal and written material and guidebook.  Patient learns different ways to obtain key vitamins and minerals, including through a recommended healthy diet. It is important to discuss all supplements you take with your doctor.   Healthy Mind-Set    Smoking Cessation  Clinical staff conducted group or individual video education with verbal and written  material and guidebook.  Patient learns that cigarette smoking and tobacco addiction pose a serious health risk which affects millions of people. Stopping smoking will significantly reduce the risk of heart disease, lung disease, and many forms of cancer. Recommended strategies for quitting are covered, including working with your doctor to develop a successful plan.  Culinary   Becoming a Set designer conducted group or individual video education with verbal and written material and guidebook.  Patient learns that cooking at home can be healthy, cost-effective, quick, and puts them in control. Keys to cooking healthy recipes will include looking at your recipe, assessing your equipment needs, planning ahead, making it simple, choosing cost-effective seasonal ingredients, and limiting the use of added fats, salts, and sugars.  Cooking - Breakfast and Snacks  Clinical staff conducted group or individual video education with verbal and written material and guidebook.  Patient learns how important breakfast is to satiety and nutrition through the entire day. Recommendations include key foods to eat during breakfast to help stabilize blood sugar levels and to prevent overeating at meals later in the day. Planning ahead is also a key component.  Cooking - Educational psychologist conducted group or individual video education with verbal and written material and guidebook.  Patient learns eating strategies to improve overall health, including an approach to cook more at home. Recommendations include thinking of animal protein as a side on your plate rather than center stage and focusing instead on lower calorie dense options like vegetables, fruits, whole grains, and plant-based proteins, such as beans. Making sauces in large quantities to freeze for later and leaving the skin on your vegetables are also recommended to maximize your experience.  Cooking - Healthy Salads and  Dressing Clinical staff conducted group or individual video education with verbal and written material and guidebook.  Patient learns that vegetables, fruits, whole grains, and legumes are the foundations of the Pritikin Eating Plan. Recommendations include how to incorporate each of these in flavorful and healthy salads, and how to create homemade salad dressings. Proper handling of ingredients is also covered. Cooking - Soups and [employer] - Soups and Desserts Clinical staff conducted group or individual video education with verbal and written material and guidebook.  Patient learns that Pritikin soups and desserts make for easy, nutritious, and delicious snacks and meal components that are low in sodium, fat, sugar, and calorie density, while high in vitamins, minerals, and filling fiber. Recommendations include simple and healthy ideas for soups and desserts.   Overview     The Pritikin Solution Program Overview Clinical staff conducted group or individual video education with verbal and written material and guidebook.  Patient learns that the results of the Pritikin Program have been documented in more than 100 articles published in peer-reviewed journals, and the benefits include  reducing risk factors for (and, in some cases, even reversing) high cholesterol, high blood pressure, type 2 diabetes, obesity, and more! An overview of the three key pillars of the Pritikin Program will be covered: eating well, doing regular exercise, and having a healthy mind-set.  WORKSHOPS  Exercise: Exercise Basics: Building Your Action Plan Clinical staff led group instruction and group discussion with PowerPoint presentation and patient guidebook. To enhance the learning environment the use of posters, models and videos may be added. At the conclusion of this workshop, patients will comprehend the difference between physical activity and exercise, as well as the benefits of incorporating both, into  their routine. Patients will understand the FITT (Frequency, Intensity, Time, and Type) principle and how to use it to build an exercise action plan. In addition, safety concerns and other considerations for exercise and cardiac rehab will be addressed by the presenter. The purpose of this lesson is to promote a comprehensive and effective weekly exercise routine in order to improve patients' overall level of fitness.   Managing Heart Disease: Your Path to a Healthier Heart Clinical staff led group instruction and group discussion with PowerPoint presentation and patient guidebook. To enhance the learning environment the use of posters, models and videos may be added.At the conclusion of this workshop, patients will understand the anatomy and physiology of the heart. Additionally, they will understand how Pritikin's three pillars impact the risk factors, the progression, and the management of heart disease.  The purpose of this lesson is to provide a high-level overview of the heart, heart disease, and how the Pritikin lifestyle positively impacts risk factors.  Exercise Biomechanics Clinical staff led group instruction and group discussion with PowerPoint presentation and patient guidebook. To enhance the learning environment the use of posters, models and videos may be added. Patients will learn how the structural parts of their bodies function and how these functions impact their daily activities, movement, and exercise. Patients will learn how to promote a neutral spine, learn how to manage pain, and identify ways to improve their physical movement in order to promote healthy living. The purpose of this lesson is to expose patients to common physical limitations that impact physical activity. Participants will learn practical ways to adapt and manage aches and pains, and to minimize their effect on regular exercise. Patients will learn how to maintain good posture while sitting, walking, and  lifting.  Balance Training and Fall Prevention  Clinical staff led group instruction and group discussion with PowerPoint presentation and patient guidebook. To enhance the learning environment the use of posters, models and videos may be added. At the conclusion of this workshop, patients will understand the importance of their sensorimotor skills (vision, proprioception, and the vestibular system) in maintaining their ability to balance as they age. Patients will apply a variety of balancing exercises that are appropriate for their current level of function. Patients will understand the common causes for poor balance, possible solutions to these problems, and ways to modify their physical environment in order to minimize their fall risk. The purpose of this lesson is to teach patients about the importance of maintaining balance as they age and ways to minimize their risk of falling.  WORKSHOPS   Nutrition:  Fueling a Ship broker led group instruction and group discussion with PowerPoint presentation and patient guidebook. To enhance the learning environment the use of posters, models and videos may be added. Patients will review the foundational principles of the Pritikin Eating Plan and understand what  constitutes a serving size in each of the food groups. Patients will also learn Pritikin-friendly foods that are better choices when away from home and review make-ahead meal and snack options. Calorie density will be reviewed and applied to three nutrition priorities: weight maintenance, weight loss, and weight gain. The purpose of this lesson is to reinforce (in a group setting) the key concepts around what patients are recommended to eat and how to apply these guidelines when away from home by planning and selecting Pritikin-friendly options. Patients will understand how calorie density may be adjusted for different weight management goals.  Mindful Eating  Clinical staff led  group instruction and group discussion with PowerPoint presentation and patient guidebook. To enhance the learning environment the use of posters, models and videos may be added. Patients will briefly review the concepts of the Pritikin Eating Plan and the importance of low-calorie dense foods. The concept of mindful eating will be introduced as well as the importance of paying attention to internal hunger signals. Triggers for non-hunger eating and techniques for dealing with triggers will be explored. The purpose of this lesson is to provide patients with the opportunity to review the basic principles of the Pritikin Eating Plan, discuss the value of eating mindfully and how to measure internal cues of hunger and fullness using the Hunger Scale. Patients will also discuss reasons for non-hunger eating and learn strategies to use for controlling emotional eating.  Targeting Your Nutrition Priorities Clinical staff led group instruction and group discussion with PowerPoint presentation and patient guidebook. To enhance the learning environment the use of posters, models and videos may be added. Patients will learn how to determine their genetic susceptibility to disease by reviewing their family history. Patients will gain insight into the importance of diet as part of an overall healthy lifestyle in mitigating the impact of genetics and other environmental insults. The purpose of this lesson is to provide patients with the opportunity to assess their personal nutrition priorities by looking at their family history, their own health history and current risk factors. Patients will also be able to discuss ways of prioritizing and modifying the Pritikin Eating Plan for their highest risk areas  Menu  Clinical staff led group instruction and group discussion with PowerPoint presentation and patient guidebook. To enhance the learning environment the use of posters, models and videos may be added. Using menus  brought in from [employer], or printed from [employer], patients will apply the Pritikin dining out guidelines that were presented in the [employer] video. Patients will also be able to practice these guidelines in a variety of provided scenarios. The purpose of this lesson is to provide patients with the opportunity to practice hands-on learning of the Pritikin Dining Out guidelines with actual menus and practice scenarios.  Label Reading Clinical staff led group instruction and group discussion with PowerPoint presentation and patient guidebook. To enhance the learning environment the use of posters, models and videos may be added. Patients will review and discuss the Pritikin label reading guidelines presented in Pritikin's Label Reading Educational series video. Using fool labels brought in from local grocery stores and markets, patients will apply the label reading guidelines and determine if the packaged food meet the Pritikin guidelines. The purpose of this lesson is to provide patients with the opportunity to review, discuss, and practice hands-on learning of the Pritikin Label Reading guidelines with actual packaged food labels. Cooking School  Pritikin's [employer] are designed to teach patients  ways to prepare quick, simple, and affordable recipes at home. The importance of nutrition's role in chronic disease risk reduction is reflected in its emphasis in the overall Pritikin program. By learning how to prepare essential core Pritikin Eating Plan recipes, patients will increase control over what they eat; be able to customize the flavor of foods without the use of added salt, sugar, or fat; and improve the quality of the food they consume. By learning a set of core recipes which are easily assembled, quickly prepared, and affordable, patients are more likely to prepare more healthy foods at home. These workshops focus on convenient breakfasts, simple  entres, side dishes, and desserts which can be prepared with minimal effort and are consistent with nutrition recommendations for cardiovascular risk reduction. Cooking [employer] are taught by a Armed forces logistics/support/administrative officer (RD) who has been trained by the [employer]. The chef or RD has a clear understanding of the importance of minimizing - if not completely eliminating - added fat, sugar, and sodium in recipes. Throughout the series of Cooking School Workshop sessions, patients will learn about healthy ingredients and efficient methods of cooking to build confidence in their capability to prepare    Cooking School weekly topics:  Adding Flavor- Sodium-Free  Fast and Healthy Breakfasts  Powerhouse Plant-Based Proteins  Satisfying Salads and Dressings  Simple Sides and Sauces  International Cuisine-Spotlight on the [employer] Zones  Delicious Desserts  Savory Soups  [employer] - Meals in a Astronomer Appetizers and Snacks  Comforting Weekend Breakfasts  One-Pot Wonders   Fast Evening Meals  Landscape architect Your Pritikin Plate  WORKSHOPS   Healthy Mindset (Psychosocial):  Focused Goals, Sustainable Changes Clinical staff led group instruction and group discussion with PowerPoint presentation and patient guidebook. To enhance the learning environment the use of posters, models and videos may be added. Patients will be able to apply effective goal setting strategies to establish at least one personal goal, and then take consistent, meaningful action toward that goal. They will learn to identify common barriers to achieving personal goals and develop strategies to overcome them. Patients will also gain an understanding of how our mind-set can impact our ability to achieve goals and the importance of cultivating a positive and growth-oriented mind-set. The purpose of this lesson is to provide patients with a deeper understanding of how to set and  achieve personal goals, as well as the tools and strategies needed to overcome common obstacles which may arise along the way.  From Head to Heart: The Power of a Healthy Outlook  Clinical staff led group instruction and group discussion with PowerPoint presentation and patient guidebook. To enhance the learning environment the use of posters, models and videos may be added. Patients will be able to recognize and describe the impact of emotions and mood on physical health. They will discover the importance of self-care and explore self-care practices which may work for them. Patients will also learn how to utilize the 4 C's to cultivate a healthier outlook and better manage stress and challenges. The purpose of this lesson is to demonstrate to patients how a healthy outlook is an essential part of maintaining good health, especially as they continue their cardiac rehab journey.  Healthy Sleep for a Healthy Heart Clinical staff led group instruction and group discussion with PowerPoint presentation and patient guidebook. To enhance the learning environment the use of posters, models and videos may be added. At the conclusion of this workshop,  patients will be able to demonstrate knowledge of the importance of sleep to overall health, well-being, and quality of life. They will understand the symptoms of, and treatments for, common sleep disorders. Patients will also be able to identify daytime and nighttime behaviors which impact sleep, and they will be able to apply these tools to help manage sleep-related challenges. The purpose of this lesson is to provide patients with a general overview of sleep and outline the importance of quality sleep. Patients will learn about a few of the most common sleep disorders. Patients will also be introduced to the concept of "sleep hygiene," and discover ways to self-manage certain sleeping problems through simple daily behavior changes. Finally, the workshop will motivate  patients by clarifying the links between quality sleep and their goals of heart-healthy living.   Recognizing and Reducing Stress Clinical staff led group instruction and group discussion with PowerPoint presentation and patient guidebook. To enhance the learning environment the use of posters, models and videos may be added. At the conclusion of this workshop, patients will be able to understand the types of stress reactions, differentiate between acute and chronic stress, and recognize the impact that chronic stress has on their health. They will also be able to apply different coping mechanisms, such as reframing negative self-talk. Patients will have the opportunity to practice a variety of stress management techniques, such as deep abdominal breathing, progressive muscle relaxation, and/or guided imagery.  The purpose of this lesson is to educate patients on the role of stress in their lives and to provide healthy techniques for coping with it.  Learning Barriers/Preferences:  Learning Barriers/Preferences - 01/22/24 1411       Learning Barriers/Preferences   Learning Barriers Sight   reading glasses   Learning Preferences Audio;Computer/Internet;Group Instruction;Individual Instruction;Skilled Demonstration;Verbal Instruction;Video;Written Material;Pictoral             Education Topics:  Knowledge Questionnaire Score:  Knowledge Questionnaire Score - 01/22/24 1542       Knowledge Questionnaire Score   Pre Score 22/24             Core Components/Risk Factors/Patient Goals at Admission:  Personal Goals and Risk Factors at Admission - 01/22/24 1411       Core Components/Risk Factors/Patient Goals on Admission    Weight Management Yes;Weight Loss;Obesity    Intervention Weight Management: Develop a combined nutrition and exercise program designed to reach desired caloric intake, while maintaining appropriate intake of nutrient and fiber, sodium and fats, and appropriate  energy expenditure required for the weight goal.;Weight Management: Provide education and appropriate resources to help participant work on and attain dietary goals.;Weight Management/Obesity: Establish reasonable short term and long term weight goals.;Obesity: Provide education and appropriate resources to help participant work on and attain dietary goals.    Admit Weight 202 lb 2.6 oz (91.7 kg)    Goal Weight: Long Term 200 lb (90.7 kg)   pt goal   Expected Outcomes Short Term: Continue to assess and modify interventions until short term weight is achieved;Weight Loss: Understanding of general recommendations for a balanced deficit meal plan, which promotes 1-2 lb weight loss per week and includes a negative energy balance of [phone] kcal/d;Understanding recommendations for meals to include 15-35% energy as protein, 25-35% energy from fat, 35-60% energy from carbohydrates, less than 200mg  of dietary cholesterol, 20-35 gm of total fiber daily;Understanding of distribution of calorie intake throughout the day with the consumption of 4-5 meals/snacks;Long Term: Adherence to nutrition and physical activity/exercise program aimed toward  attainment of established weight goal    Hypertension Yes    Intervention Provide education on lifestyle modifcations including regular physical activity/exercise, weight management, moderate sodium restriction and increased consumption of fresh fruit, vegetables, and low fat dairy, alcohol moderation, and smoking cessation.;Monitor prescription use compliance.    Expected Outcomes Long Term: Maintenance of blood pressure at goal levels.;Short Term: Continued assessment and intervention until BP is < 140/34mm HG in hypertensive participants. < 130/43mm HG in hypertensive participants with diabetes, heart failure or chronic kidney disease.    Lipids Yes    Intervention Provide education and support for participant on nutrition & aerobic/resistive exercise along with prescribed  medications to achieve LDL 70mg , HDL >40mg .    Expected Outcomes Short Term: Participant states understanding of desired cholesterol values and is compliant with medications prescribed. Participant is following exercise prescription and nutrition guidelines.;Long Term: Cholesterol controlled with medications as prescribed, with individualized exercise RX and with personalized nutrition plan. Value goals: LDL < 70mg , HDL > 40 mg.             Core Components/Risk Factors/Patient Goals Review:   Goals and Risk Factor Review     Row Name 02/20/24 0951 03/05/24 1714 04/03/24 0742         Core Components/Risk Factors/Patient Goals Review   Personal Goals Review Weight Management/Obesity;Hypertension;Lipids;Stress Weight Management/Obesity;Hypertension;Lipids;Stress Weight Management/Obesity;Hypertension;Lipids;Stress     Review Terry Vega started cardiac rehab on 02/19/24. Terry Vega did well with exercise and denied having any chest pain. Vital signs were stable. Terry Vega is doing well  with exercise  at cardiac rehab. Vital signs have been stable. Terry Vega has lost 2.9 kg since starting the program. Terry Vega continues to do well  with exercise  at cardiac rehab. Vital signs have been stable. Terry Vega has lost 4.1  kg since starting the program. Terry Vega has not had any angina with exercise.     Expected Outcomes Terry Vega will continue to participate in cardiac rehab for exercise, nutrition and lifestyle modifications Terry Vega will continue to participate in cardiac rehab for exercise, nutrition and lifestyle modifications Terry Vega will continue to participate in cardiac rehab for exercise, nutrition and lifestyle modifications              Core Components/Risk Factors/Patient Goals at Discharge (Final Review):   Goals and Risk Factor Review - 04/03/24 0742       Core Components/Risk Factors/Patient Goals Review   Personal Goals Review Weight Management/Obesity;Hypertension;Lipids;Stress    Review Terry Vega continues to do well  with  exercise  at cardiac rehab. Vital signs have been stable. Terry Vega has lost 4.1  kg since starting the program. Terry Vega has not had any angina with exercise.    Expected Outcomes Terry Vega will continue to participate in cardiac rehab for exercise, nutrition and lifestyle modifications             ITP Comments:  ITP Comments     Row Name 01/22/24 1406 02/08/24 1432 02/20/24 0946 03/05/24 1711 04/03/24 0739   ITP Comments Dr. Gaylyn Keas medical director. Introduction to pritikin education/intensive cardiac rehab. Initial orientation packet reviewed with patient. 30 Day ITP Review.    Patient has been cleared to return to cardiac rehab per Dr Alvenia Aus. Terry Vega plans to return on 02/19/24 to begin exercise. Terry Vega is starting his new dose of isosorbide  and wants to given the medication time to take effect. 30 Day ITP Review.    Terry Vega began exercise on 02/19/24. Russ tolerated exercise without complaints or angina. 30 Day ITP Review.  Terry Vega  has good attendance and participation with  exercise at cardiac rehab. 30 Day ITP Review.  Terry Vega continues ot have good attendance and participation with  exercise at cardiac rehab.            Comments: See ITP comments.Monte Antonio RN BSN

## 2024-04-05 ENCOUNTER — Encounter (HOSPITAL_COMMUNITY)

## 2024-04-09 ENCOUNTER — Other Ambulatory Visit: Payer: Self-pay

## 2024-04-09 ENCOUNTER — Ambulatory Visit
Admission: RE | Admit: 2024-04-09 | Discharge: 2024-04-09 | Disposition: A | Discharge status: Home / Self Care | Source: Ambulatory Visit | Attending: Physician Assistant | Admitting: Physician Assistant

## 2024-04-09 VITALS — BP 148/80 | HR 76 | Temp 98.4°F | Resp 18

## 2024-04-09 DIAGNOSIS — J0111 Acute recurrent frontal sinusitis: Secondary | ICD-10-CM

## 2024-04-09 MED ORDER — FLUTICASONE PROPIONATE 50 MCG/ACT NA SUSP: 1.0000 | Freq: Every day | NASAL | 0 refills | Status: DC

## 2024-04-09 MED ORDER — DOXYCYCLINE HYCLATE 100 MG PO CAPS: 100.0000 mg | ORAL_CAPSULE | Freq: Two times a day (BID) | ORAL | 0 refills | Status: DC

## 2024-04-09 NOTE — ED Triage Notes (Signed)
 Pt sts seen here on 5/16 for same and given antibiotics; pt sts improvement but then sx returned with HA, facial pressure and nasal congestion

## 2024-04-09 NOTE — Discharge Instructions (Signed)
 Start doxycycline 100 mg twice daily for 10 days.  Stay out of the sun while on this medication.  Use fluticasone nasal spray to help with congestion.  I also recommend continuing the loratadine that was previously prescribed as well as over-the-counter Mucinex.  Make sure you rest and drink plenty of fluid.  If you are not improving within a few days follow-up with ENT; call to schedule an appointment.  If anything worsens and you have high fever, worsening cough, shortness of breath you need to be seen immediately.

## 2024-04-09 NOTE — ED Provider Notes (Signed)
 [facility]    CSN: [id number] Arrival date & time: 04/09/24  4098      History   Chief Complaint Chief Complaint  Patient presents with   Nasal Congestion    Sinus infection.  I was treated at this office on 5/17  and received amoxicillion for 7 days. After finishing the prescription the symptoms returned on the 10th day. - Entered by patient    HPI Terry Vega is a 78 y.o. male.   Patient presents today with a weeklong history of recurrent sinus symptoms.  He was seen on 03/29/2024 for similar symptoms and started on amoxicillin  875 mg twice daily for 7 days.  He reports completing course of medication and did have improvement but never complete resolution of symptoms.  He continues to have left-sided frontal and maxillary tenderness, significant congestion that is primarily on the left side, sinus pressure.  Denies any cough, shortness of breath, fever, chest pain, nausea, vomiting.  He has been taking previously recommended loratadine without improvement.  He does have a history of seasonal allergies but generally these are well-controlled.  He has never seen an ENT or had sinus surgery.  Denies any additional antibiotics besides the amoxicillin  prescribed at last visit in the past 90 days.    Past Medical History:  Diagnosis Date   Allergy    Arthritis    [employer] 07/21/2020   CAD (coronary artery disease)    a. 11/2023 Cath: LM 49m/d, LAD 70p/m, D1 99, D2 80, LCX 70ost, 68m/d, OM2 90, RCA 40p/m, 90d, EF 55-65%; b. 12/2023 CABG x 5: LIMA->LAD, VG->D1, VG->D2, VG->OM, VG->dRCA.   Diastolic dysfunction    a. 07/2021 Echo: EF 60-65%.  Mild LVH.  Grade 1 DD.  Normal RV fxn. mildly dil LA. Triv MR/AI. Mild AoV sclerosis.   Heart murmur    Hypertension    Hypothyroidism    PVC's (premature ventricular contractions)     Patient Active Problem List   Diagnosis Date Noted   Hyperlipidemia LDL goal <55 03/07/2024   Coronary artery disease involving coronary bypass  graft of native heart with unstable angina pectoris (HCC) 01/31/2024   S/P CABG x 5 12/19/2023   Unstable angina (HCC) 12/15/2023   Leg edema 05/06/2014   Frequent PVCs 10/15/2013   High blood pressure 10/08/2012   Hypothyroid 10/08/2012    Past Surgical History:  Procedure Laterality Date   COLONOSCOPY     CORONARY ARTERY BYPASS GRAFT N/A 12/19/2023   Procedure: CORONARY ARTERY BYPASS GRAFTING (CABG) TIMES FIVE USING LEFT INTERNAL MAMMARY ARTERY AND BILATERAL ENDOSCOPICALLY HARVESTED GREATER SAPHENOUS VEIN;  Surgeon: Bartley Lightning, MD;  Location: [facility] OR;  Service: Open Heart Surgery;  Laterality: N/A;   CORONARY STENT INTERVENTION N/A 01/31/2024   Procedure: CORONARY STENT INTERVENTION;  Surgeon: Sammy Crisp, MD;  Location: [facility];  Service: Cardiovascular;  Laterality: N/A;   HEMORRHOID SURGERY     LEFT HEART CATH AND CORONARY ANGIOGRAPHY Left 12/15/2023   Procedure: LEFT HEART CATH AND CORONARY ANGIOGRAPHY;  Surgeon: Wenona Hamilton, MD;  Location: [facility];  Service: Cardiovascular;  Laterality: Left;   LEFT HEART CATH AND CORS/GRAFTS ANGIOGRAPHY Left 01/31/2024   Procedure: LEFT HEART CATH AND CORS/GRAFTS ANGIOGRAPHY;  Surgeon: Sammy Crisp, MD;  Location: [facility];  Service: Cardiovascular;  Laterality: Left;   TEE WITHOUT CARDIOVERSION N/A 12/19/2023   Procedure: TRANSESOPHAGEAL ECHOCARDIOGRAM (TEE);  Surgeon: Bartley Lightning, MD;  Location: [facility] OR;  Service: Open Heart  Surgery;  Laterality: N/A;   VASECTOMY         Home Medications    Prior to Admission medications   Medication Sig Start Date End Date Taking? Authorizing Provider  doxycycline  (VIBRAMYCIN ) 100 MG capsule Take 1 capsule (100 mg total) by mouth 2 (two) times daily. 04/09/24  Yes Jame Seelig K, PA-C  fluticasone  (FLONASE ) 50 MCG/ACT nasal spray Place 1 spray into both nostrils daily. 04/09/24  Yes Devaun Hernandez, Betsey Brow, PA-C  aspirin  EC 81 MG tablet Take 1 tablet (81 mg total)  by mouth daily. Swallow whole. 01/31/24 01/30/25  End, Veryl Gottron, MD  atorvastatin  (LIPITOR) 40 MG tablet Take 1 tablet (40 mg total) by mouth daily. 12/15/23 12/14/24  Wenona Hamilton, MD  carvedilol  (COREG ) 6.25 MG tablet Take 1 tablet (6.25 mg total) by mouth 2 (two) times daily. 10/17/23   Wenona Hamilton, MD  cholecalciferol (VITAMIN D ) 1000 UNITS tablet Take 1,000 Units by mouth daily.    [provider]  clopidogrel  (PLAVIX ) 75 MG tablet Take 1 tablet (75 mg total) by mouth daily. 04/02/24 04/02/25  End, Veryl Gottron, MD  ezetimibe  (ZETIA ) 10 MG tablet Take 1 tablet (10 mg total) by mouth daily. 03/12/24 06/10/24  Wenona Hamilton, MD  isosorbide  mononitrate (IMDUR ) 60 MG 24 hr tablet Take 1 tablet (60 mg total) by mouth daily. 02/08/24 02/07/25  Wenona Hamilton, MD  levothyroxine  (SYNTHROID ) 100 MCG tablet Take 1 tablet (100 mcg total) by mouth daily. 08/04/23   Benjiman Bras, MD  Multiple Vitamin (MULTIVITAMIN) tablet Take 1 tablet by mouth daily.    [provider]  nitroGLYCERIN  (NITROSTAT ) 0.4 MG SL tablet Place 0.4 mg under the tongue every 5 (five) minutes as needed for chest pain.    [provider]  PREVIDENT 5000 BOOSTER PLUS 1.1 % PSTE Place 1 application  onto teeth at bedtime. 08/14/23   [provider]  triamcinolone  cream (KENALOG ) 0.1 % Apply 1 Application topically daily as needed (irritation). 12/24/23   Allegra Arch, PA-C  vitamin E 1000 UNIT capsule Take 1,000 Units by mouth daily.    [provider]    Family History Family History  Problem Relation Age of Onset   Heart disease Mother    Cancer Father    Diabetes Father    Stroke Maternal Grandmother    Diabetes Maternal Grandmother    Colon cancer Neg Hx    Esophageal cancer Neg Hx    Rectal cancer Neg Hx    Stomach cancer Neg Hx     Social History Social History   Tobacco Use   Smoking status: Never   Smokeless tobacco: Never  Vaping Use   Vaping  status: Never Used  Substance Use Topics   Alcohol use: Yes    Comment: occasionally   Drug use: No     Allergies   Patient has no known allergies.   Review of Systems Review of Systems  Constitutional:  Positive for activity change. Negative for appetite change, fatigue and fever.  HENT:  Positive for congestion, postnasal drip, sinus pressure and sinus pain. Negative for ear pain, sneezing and sore throat.   Respiratory:  Negative for cough and shortness of breath.   Cardiovascular:  Negative for chest pain.  Gastrointestinal:  Negative for abdominal pain, diarrhea, nausea and vomiting.  Neurological:  Positive for headaches. Negative for dizziness and light-headedness.     Physical Exam Triage Vital Signs ED Triage Vitals  Encounter Vitals Group  BP 04/09/24 0956 (!) 148/80     Systolic BP Percentile --      Diastolic BP Percentile --      Pulse Rate 04/09/24 0956 76     Resp 04/09/24 0956 18     Temp 04/09/24 0956 98.4 F (36.9 C)     Temp Source 04/09/24 0956 Oral     SpO2 04/09/24 0956 95 %     Weight --      Height --      Head Circumference --      Peak Flow --      Pain Score 04/09/24 0957 3     Pain Loc --      Pain Education --      Exclude from Growth Chart --    No data found.  Updated Vital Signs BP (!) 148/80 (BP Location: Left Arm)   Pulse 76   Temp 98.4 F (36.9 C) (Oral)   Resp 18   SpO2 95%   Visual Acuity Right Eye Distance:   Left Eye Distance:   Bilateral Distance:    Right Eye Near:   Left Eye Near:    Bilateral Near:     Physical Exam Vitals reviewed.  Constitutional:      General: He is awake.     Appearance: Normal appearance. He is well-developed. He is not ill-appearing.     Comments: Very pleasant male appear stated age in no acute distress sitting comfortably in exam room  HENT:     Head: Normocephalic and atraumatic.     Right Ear: Tympanic membrane, ear canal and external ear normal. Tympanic membrane is not  erythematous or bulging.     Left Ear: Tympanic membrane, ear canal and external ear normal. Tympanic membrane is not erythematous or bulging.     Nose: Congestion and rhinorrhea present. Rhinorrhea is purulent.     Right Nostril: No septal hematoma.     Left Nostril: No septal hematoma.     Left Turbinates: Enlarged and swollen.     Right Sinus: No maxillary sinus tenderness or frontal sinus tenderness.     Left Sinus: Maxillary sinus tenderness and frontal sinus tenderness present.     Mouth/Throat:     Pharynx: Uvula midline. Postnasal drip present. No oropharyngeal exudate, posterior oropharyngeal erythema or uvula swelling.  Cardiovascular:     Rate and Rhythm: Normal rate and regular rhythm.     Heart sounds: Normal heart sounds, S1 normal and S2 normal. No murmur heard. Pulmonary:     Effort: Pulmonary effort is normal. No accessory muscle usage or respiratory distress.     Breath sounds: Normal breath sounds. No stridor. No wheezing, rhonchi or rales.     Comments: Clear to auscultation bilaterally Abdominal:     General: Bowel sounds are normal.     Palpations: Abdomen is soft.     Tenderness: There is no abdominal tenderness.  Neurological:     Mental Status: He is alert.  Psychiatric:        Behavior: Behavior is cooperative.      UC Treatments / Results  Labs (all labs ordered are listed, but only abnormal results are displayed) Labs Reviewed - No data to display  EKG   Radiology No results found.  Procedures Procedures (including critical care time)  Medications Ordered in UC Medications - No data to display  Initial Impression / Assessment and Plan / UC Course  I have reviewed the triage vital signs and the nursing  notes.  Pertinent labs & imaging results that were available during my care of the patient were reviewed by me and considered in my medical decision making (see chart for details).     Patient is well-appearing, afebrile, nontoxic,  nontachycardic.  Viral testing was deferred as he has been symptomatic for several weeks and this would not change management.  Chest x-ray was deferred as patient denies any cough/shortness of breath and had no adventitious lung sounds on exam with an appropriate oxygen saturation level.  I am concerned for recurrent sinusitis given purulent congestion on exam with left-sided sinus tenderness.  Will cover with doxycycline given recent amoxicillin  use.  We discussed that he is to avoid prolonged sun exposure while on this medication.  He is to continue over-the-counter medication including antihistamine and Mucinex.  Will add fluticasone nasal spray.  We discussed that if his symptoms or not improving he may need sinus CT which we do not have access to in urgent care.  He was given the contact information for ENT and we discussed that if he is not improving with medication regimen he will be worthwhile to call them to schedule an appointment.  If anything worsens or changes and he has high fever, worsening cough, shortness of breath, chest pain, nausea, vomiting he needs to be seen immediately.  Strict return precautions given.  All questions were answered to patient satisfaction.  Final Clinical Impressions(s) / UC Diagnoses   Final diagnoses:  Acute recurrent frontal sinusitis     Discharge Instructions      Start doxycycline 100 mg twice daily for 10 days.  Stay out of the sun while on this medication.  Use fluticasone nasal spray to help with congestion.  I also recommend continuing the loratadine that was previously prescribed as well as over-the-counter Mucinex.  Make sure you rest and drink plenty of fluid.  If you are not improving within a few days follow-up with ENT; call to schedule an appointment.  If anything worsens and you have high fever, worsening cough, shortness of breath you need to be seen immediately.   ED Prescriptions     Medication Sig Dispense Auth. Provider   doxycycline  (VIBRAMYCIN) 100 MG capsule Take 1 capsule (100 mg total) by mouth 2 (two) times daily. 20 capsule Izadora Roehr K, PA-C   fluticasone (FLONASE) 50 MCG/ACT nasal spray Place 1 spray into both nostrils daily. 16 g Brynleigh Sequeira K, PA-C      PDMP not reviewed this encounter.   Budd Cargo, PA-C 04/09/24 1019

## 2024-04-10 ENCOUNTER — Encounter (HOSPITAL_COMMUNITY)
Admission: RE | Admit: 2024-04-10 | Discharge: 2024-04-10 | Disposition: A | Discharge status: Home / Self Care | Source: Ambulatory Visit | Attending: Cardiovascular Disease

## 2024-04-10 DIAGNOSIS — Z951 Presence of aortocoronary bypass graft: Secondary | ICD-10-CM

## 2024-04-12 ENCOUNTER — Encounter (HOSPITAL_COMMUNITY)

## 2024-04-15 ENCOUNTER — Encounter (HOSPITAL_COMMUNITY)
Admission: RE | Admit: 2024-04-15 | Discharge: 2024-04-15 | Disposition: A | Discharge status: Home / Self Care | Source: Ambulatory Visit | Attending: Surgery | Admitting: Surgery

## 2024-04-15 VITALS — Ht 67.0 in | Wt 192.2 lb

## 2024-04-15 DIAGNOSIS — Z48812 Encounter for surgical aftercare following surgery on the circulatory system: Secondary | ICD-10-CM | POA: Insufficient documentation

## 2024-04-15 DIAGNOSIS — Z951 Presence of aortocoronary bypass graft: Secondary | ICD-10-CM | POA: Diagnosis not present

## 2024-04-17 ENCOUNTER — Encounter (HOSPITAL_COMMUNITY)
Admission: RE | Admit: 2024-04-17 | Discharge: 2024-04-17 | Disposition: A | Discharge status: Home / Self Care | Source: Ambulatory Visit | Attending: Cardiovascular Disease | Admitting: Cardiovascular Disease

## 2024-04-17 DIAGNOSIS — Z951 Presence of aortocoronary bypass graft: Secondary | ICD-10-CM

## 2024-04-17 DIAGNOSIS — Z48812 Encounter for surgical aftercare following surgery on the circulatory system: Secondary | ICD-10-CM | POA: Diagnosis not present

## 2024-04-17 NOTE — Progress Notes (Signed)
 Discharge Progress Report  Patient Details  Name: KAYDE WAREHIME MRN: [id number] Date of Birth: [birth year] Referring Provider:   Flowsheet Row INTENSIVE CARDIAC REHAB ORIENT from 01/22/2024 in [facility]  Referring Provider Antionette Kirks, MD        Number of Visits: 36  Reason for Discharge:  Patient reached a stable level of exercise. Patient independent in their exercise. Patient has met program and personal goals.  Smoking History:  Social History   Tobacco Use  Smoking Status Never  Smokeless Tobacco Never    Diagnosis:  12/20/23 CABG x 5  ADL [facility]:   Initial Exercise Prescription:  Initial Exercise Prescription - 01/22/24 1500       Date of Initial Exercise RX and Referring Provider   Date 01/22/24    Referring Provider Antionette Kirks, MD    Expected Discharge Date 04/17/24      Recumbant Bike   Level 1    RPM 50    Watts 30    Minutes 15    METs 2      NuStep   Level 2    SPM 75    Minutes 15    METs 2.3      Prescription Details   Frequency (times per week) 3    Duration Progress to 30 minutes of continuous aerobic without signs/symptoms of physical distress      Intensity   THRR 40-80% of Max Heartrate 56-113    Ratings of Perceived Exertion 11-13    Perceived Dyspnea 0-4      Progression   Progression Continue progressive overload as per policy without signs/symptoms or physical distress.      Resistance Training   Training Prescription Yes    Weight 3    Reps 10-15             Discharge Exercise Prescription (Final Exercise Prescription Changes):  Exercise Prescription Changes - 04/01/24 1652       Response to Exercise   Blood Pressure (Admit) 122/56    Blood Pressure (Exit) 120/60    Heart Rate (Admit) 70 bpm    Heart Rate (Exercise) 100 bpm    Heart Rate (Exit) 71 bpm    Rating of Perceived Exertion (Exercise) 10    Perceived Dyspnea (Exercise) 0     Symptoms none    Comments REVD MET's    Duration Progress to 30 minutes of  aerobic without signs/symptoms of physical distress    Intensity THRR unchanged      Progression   Progression Continue to progress workloads to maintain intensity without signs/symptoms of physical distress.    Average METs 2.42      Resistance Training   Training Prescription Yes    Weight 3    Reps 10-15    Time 10 Minutes      Treadmill   MPH 1.7    Grade 0    Minutes 15    METs 2.3      NuStep   Level 2    SPM 105    Minutes 15    METs 2.8      Home Exercise Plan   Plans to continue exercise at Home (comment)    Frequency Add 3 additional days to program exercise sessions.    Initial Home Exercises Provided 03/13/24             Functional Capacity:  6 Minute Walk     Row  Name 01/22/24 1539 04/17/24 1703       6 Minute Walk   Phase Initial Discharge    Distance 1420 feet 1535 feet    Distance % Change -- 8.1 %    Distance Feet Change -- 115 ft    Walk Time 6 minutes 6 minutes    # of Rest Breaks 0 0    MPH 2.69 2.91    METS 2.6 2.97    RPE 11 12    Perceived Dyspnea  0 0    VO2 Peak 9.09 10.41    Symptoms No No    Resting HR 75 bpm 72 bpm    Resting BP 106/62 128/64    Resting Oxygen Saturation  95 % --    Exercise Oxygen Saturation  during 6 min walk 93 % --    Max Ex. HR 108 bpm 103 bpm    Max Ex. BP 142/64 160/60    2 Minute Post BP 112/68 132/68             Psychological, QOL, Others - Outcomes: PHQ 2/9:    04/17/2024    3:35 PM 01/22/2024    2:09 PM 08/04/2023    7:52 AM 05/31/2023    9:37 AM 09/01/2022    2:44 PM  Depression screen PHQ 2/9  Decreased Interest 0 0 0 0 0  Down, Depressed, Hopeless 0 0 0 0 0  PHQ - 2 Score 0 0 0 0 0  Altered sleeping 0 1 0 0   Tired, decreased energy 0 1 1 0   Change in appetite 0 1 0 0   Feeling bad or failure about yourself  0 0 0 0   Trouble concentrating 0 0 0 0   Moving slowly or fidgety/restless 0 0 0 0    Suicidal thoughts 0 0 0 0   PHQ-9 Score 0 3 1 0   Difficult doing work/chores Not difficult at all Not difficult at all Not difficult at all Not difficult at all     Quality of Life:  Quality of Life - 04/17/24 1706       Quality of Life   Select Quality of Life      Quality of Life Scores   Health/Function Post 22.77 %    Socioeconomic Post 26.29 %    Psych/Spiritual Post 26.14 %    Family Post 28.8 %    GLOBAL Post 25.07 %             Personal Goals: Goals established at orientation with interventions provided to work toward goal.  Personal Goals and Risk Factors at Admission - 01/22/24 1411       Core Components/Risk Factors/Patient Goals on Admission    Weight Management Yes;Weight Loss;Obesity    Intervention Weight Management: Develop a combined nutrition and exercise program designed to reach desired caloric intake, while maintaining appropriate intake of nutrient and fiber, sodium and fats, and appropriate energy expenditure required for the weight goal.;Weight Management: Provide education and appropriate resources to help participant work on and attain dietary goals.;Weight Management/Obesity: Establish reasonable short term and long term weight goals.;Obesity: Provide education and appropriate resources to help participant work on and attain dietary goals.    Admit Weight 202 lb 2.6 oz (91.7 kg)    Goal Weight: Long Term 200 lb (90.7 kg)   pt goal   Expected Outcomes Short Term: Continue to assess and modify interventions until short term weight is achieved;Weight Loss: Understanding of general  recommendations for a balanced deficit meal plan, which promotes 1-2 lb weight loss per week and includes a negative energy balance of [phone] kcal/d;Understanding recommendations for meals to include 15-35% energy as protein, 25-35% energy from fat, 35-60% energy from carbohydrates, less than 200mg  of dietary cholesterol, 20-35 gm of total fiber daily;Understanding of  distribution of calorie intake throughout the day with the consumption of 4-5 meals/snacks;Long Term: Adherence to nutrition and physical activity/exercise program aimed toward attainment of established weight goal    Hypertension Yes    Intervention Provide education on lifestyle modifcations including regular physical activity/exercise, weight management, moderate sodium restriction and increased consumption of fresh fruit, vegetables, and low fat dairy, alcohol moderation, and smoking cessation.;Monitor prescription use compliance.    Expected Outcomes Long Term: Maintenance of blood pressure at goal levels.;Short Term: Continued assessment and intervention until BP is < 140/58mm HG in hypertensive participants. < 130/2mm HG in hypertensive participants with diabetes, heart failure or chronic kidney disease.    Lipids Yes    Intervention Provide education and support for participant on nutrition & aerobic/resistive exercise along with prescribed medications to achieve LDL 70mg , HDL >40mg .    Expected Outcomes Short Term: Participant states understanding of desired cholesterol values and is compliant with medications prescribed. Participant is following exercise prescription and nutrition guidelines.;Long Term: Cholesterol controlled with medications as prescribed, with individualized exercise RX and with personalized nutrition plan. Value goals: LDL < 70mg , HDL > 40 mg.              Personal Goals Discharge:  Goals and Risk Factor Review     Row Name 02/20/24 0951 03/05/24 1714 04/03/24 0742         Core Components/Risk Factors/Patient Goals Review   Personal Goals Review Weight Management/Obesity;Hypertension;Lipids;Stress Weight Management/Obesity;Hypertension;Lipids;Stress Weight Management/Obesity;Hypertension;Lipids;Stress     Review Hilarie Lovely started cardiac rehab on 02/19/24. Hilarie Lovely did well with exercise and denied having any chest pain. Vital signs were stable. Hilarie Lovely is doing well  with  exercise  at cardiac rehab. Vital signs have been stable. Hilarie Lovely has lost 2.9 kg since starting the program. Hilarie Lovely continues to do well  with exercise  at cardiac rehab. Vital signs have been stable. Hilarie Lovely has lost 4.1  kg since starting the program. Hilarie Lovely has not had any angina with exercise.     Expected Outcomes Hilarie Lovely will continue to participate in cardiac rehab for exercise, nutrition and lifestyle modifications Hilarie Lovely will continue to participate in cardiac rehab for exercise, nutrition and lifestyle modifications Hilarie Lovely will continue to participate in cardiac rehab for exercise, nutrition and lifestyle modifications              Exercise Goals and Review:  Exercise Goals     Row Name 01/22/24 1409             Exercise Goals   Increase Physical Activity Yes       Intervention Provide advice, education, support and counseling about physical activity/exercise needs.;Develop an individualized exercise prescription for aerobic and resistive training based on initial evaluation findings, risk stratification, comorbidities and participant's personal goals.       Expected Outcomes Short Term: Attend rehab on a regular basis to increase amount of physical activity.;Long Term: Exercising regularly at least 3-5 days a week.;Long Term: Add in home exercise to make exercise part of routine and to increase amount of physical activity.       Increase Strength and Stamina Yes       Intervention Provide advice, education, support  and counseling about physical activity/exercise needs.;Develop an individualized exercise prescription for aerobic and resistive training based on initial evaluation findings, risk stratification, comorbidities and participant's personal goals.       Expected Outcomes Short Term: Increase workloads from initial exercise prescription for resistance, speed, and METs.;Short Term: Perform resistance training exercises routinely during rehab and add in resistance training at home;Long Term:  Improve cardiorespiratory fitness, muscular endurance and strength as measured by increased METs and functional capacity ( )       Able to understand and use rate of perceived exertion (RPE) scale Yes       Intervention Provide education and explanation on how to use RPE scale       Expected Outcomes Short Term: Able to use RPE daily in rehab to express subjective intensity level;Long Term:  Able to use RPE to guide intensity level when exercising independently       Knowledge and understanding of Target Heart Rate Range (THRR) Yes       Intervention Provide education and explanation of THRR including how the numbers were predicted and where they are located for reference       Expected Outcomes Short Term: Able to state/look up THRR;Short Term: Able to use daily as guideline for intensity in rehab;Long Term: Able to use THRR to govern intensity when exercising independently       Understanding of Exercise Prescription Yes       Intervention Provide education, explanation, and written materials on patient's individual exercise prescription       Expected Outcomes Short Term: Able to explain program exercise prescription;Long Term: Able to explain home exercise prescription to exercise independently                Exercise Goals Re-Evaluation:  Exercise Goals Re-Evaluation     Row Name 02/19/24 1651 03/13/24 1658 04/01/24 1656 04/17/24 1658       Exercise Goal Re-Evaluation   Exercise Goals Review Increase Physical Activity;Understanding of Exercise Prescription;Increase Strength and Stamina;Knowledge and understanding of Target Heart Rate Range (THRR);Able to understand and use rate of perceived exertion (RPE) scale Increase Physical Activity;Understanding of Exercise Prescription;Increase Strength and Stamina;Knowledge and understanding of Target Heart Rate Range (THRR);Able to understand and use rate of perceived exertion (RPE) scale Increase Physical Activity;Understanding of Exercise  Prescription;Increase Strength and Stamina;Knowledge and understanding of Target Heart Rate Range (THRR);Able to understand and use rate of perceived exertion (RPE) scale Increase Physical Activity;Understanding of Exercise Prescription;Increase Strength and Stamina;Knowledge and understanding of Target Heart Rate Range (THRR);Able to understand and use rate of perceived exertion (RPE) scale    Comments Pt first day in the [facility] program. Pt tolerated exercise well with an average MET level of 1.8. Pt is off to a good start and is leanring his THRR, RPE and ExRx Reviewed MET's, goals and home ExRx. Pt tolerated exercise well with an average MET level of 2.04. Pt is is doing well, switched today to two modalities. Added TM today. He is already doing some exercise at home by walking, he will add more time by using his seated stepper and stretches. The next goal is to get to 30 mins, 2-3 days on his own Reviewed MET's and goals. Pt tolerated exercise well with an average MET level of 2.42. He has been doing well with his exercise and making gradual increases which has increase his MET's. He is feeling an increase in strength and stamina and is already under his initial goal weight. Pt graduated the  [facility] program. Pt tolerated exercise well with an average MET level of 2.81. He did very well in the program and increased his confidence with exercise. He increased his distance on his post walk test by 164ft for a total of 1512ft and decreased his %BF. He will continue to exercise on his own by walking, using his stationary bike and stretching 5-7 day 30-45 mins per session.    Expected Outcomes Will continue to monitor pt and progress workloads as tolerated without sign or symptom Will continue to monitor pt and progress workloads as tolerated without sign or symptom Will continue to monitor pt and progress workloads as tolerated without sign or symptom Will continue to monitor pt and progress workloads as  tolerated without sign or symptom             Nutrition & Weight - Outcomes:  Pre Biometrics - 01/22/24 1407       Pre Biometrics   Waist Circumference 42.5 inches    Hip Circumference 43.5 inches    Waist to Hip Ratio 0.98 %    Triceps Skinfold 23 mm    % Body Fat 32.4 %    Grip Strength 36 kg    Flexibility 11 in    Single Leg Stand 1.25 seconds             Post Biometrics - 04/17/24 1704        Post  Biometrics   Height 5' 7 (1.702 m)    Weight 87.2 kg    Waist Circumference 42 inches    Hip Circumference 43 inches    Waist to Hip Ratio 0.98 %    BMI (Calculated) 30.1    Triceps Skinfold 15 mm    % Body Fat 29.9 %    Grip Strength 28 kg    Flexibility 11.5 in    Single Leg Stand 3.5 seconds             Nutrition:  Nutrition Therapy & Goals - 04/17/24 1416       Nutrition Therapy   Diet Heart Healthy Diet    Drug/Food Interactions Statins/Certain Fruits      Personal Nutrition Goals   Nutrition Goal Patient to identify strategies for reducing cardiovascular risk by attending the [facility] education and nutrition series weekly.   goal met.   Personal Goal #2 Patient to improve diet quality by using the plate method as a guide for meal planning to include lean protein/plant protein, fruits, vegetables, whole grains, nonfat dairy as part of a well-balanced diet.   goal in action   Personal Goal #3 Patient to identify strategies for weight loss of 0.5-2.0# per week   goal in progress.   Comments Goals in progress. Hilarie Lovely has medical history of CAD, CABGx5, HTN, hyperlipidemia. He is already started making many heart healthy dietary changes. He has attended the [facility] education/nutrition series regularly throughout intensive cardiac rehab. His wife is an excellent support. He is motivated to lose weight and is down 10# since orientation to our program. LDL is not at goal of <55; declined PCSK9i injectables and opted to start ezetimibe  per documentation on  4/29. Patient will benefit from adherence to nutrition, exercise, and lifestyle modification.      Intervention Plan   Intervention Prescribe, educate and counsel regarding individualized specific dietary modifications aiming towards targeted core components such as weight, hypertension, lipid management, diabetes, heart failure and other comorbidities.;Nutrition handout(s) given to patient.    Expected Outcomes Short Term  Goal: Understand basic principles of dietary content, such as calories, fat, sodium, cholesterol and nutrients.;Long Term Goal: Adherence to prescribed nutrition plan.             Nutrition Discharge:  Nutrition Assessments - 04/17/24 1706       Rate Your Plate Scores   Post Score 76             Education Questionnaire Score:  Knowledge Questionnaire Score - 04/17/24 1708       Knowledge Questionnaire Score   Post Score 24/24             Goals reviewed with patient; copy given to patient.Pt graduates from  Intensive/Traditional cardiac rehab program on 04/17/24  with completion of  36  exercise and education sessions. Pt maintained good attendance and progressed nicely during their participation in rehab as evidenced by increased MET level. Hilarie Lovely increased his distance on his post exercise walk test by 115 feet and lost 4.5 kg.   Medication list reconciled. Repeat  PHQ score-  0.  Pt has made significant lifestyle changes and should be commended for their success.  Hilarie Lovely  achieved their goals during cardiac rehab.   Pt plans to continue exercise at home walking . We are proud of Hilarie Lovely progress and weight loss.Monte Antonio RN BSN

## 2024-05-03 DIAGNOSIS — J322 Chronic ethmoidal sinusitis: Secondary | ICD-10-CM | POA: Diagnosis not present

## 2024-05-03 DIAGNOSIS — J321 Chronic frontal sinusitis: Secondary | ICD-10-CM | POA: Insufficient documentation

## 2024-05-03 DIAGNOSIS — J342 Deviated nasal septum: Secondary | ICD-10-CM | POA: Diagnosis not present

## 2024-05-13 DIAGNOSIS — E785 Hyperlipidemia, unspecified: Secondary | ICD-10-CM | POA: Diagnosis not present

## 2024-05-14 LAB — HEPATIC FUNCTION PANEL
ALT: 13 IU/L (ref 0–44)
AST: 17 IU/L (ref 0–40)
Albumin: 4.1 g/dL (ref 3.8–4.8)
Alkaline Phosphatase: 50 IU/L (ref 44–121)
Bilirubin Total: 0.6 mg/dL (ref 0.0–1.2)
Bilirubin, Direct: 0.24 mg/dL (ref 0.00–0.40)
Total Protein: 6.3 g/dL (ref 6.0–8.5)

## 2024-05-14 LAB — LIPID PANEL
Chol/HDL Ratio: 2.8 ratio (ref 0.0–5.0)
Cholesterol, Total: 99 mg/dL — ABNORMAL LOW (ref 100–199)
HDL: 35 mg/dL — ABNORMAL LOW (ref >39)
LDL Chol Calc (NIH): 48 mg/dL (ref 0–99)
Triglycerides: 74 mg/dL (ref 0–149)
VLDL Cholesterol Cal: 16 mg/dL (ref 5–40)

## 2024-05-15 ENCOUNTER — Ambulatory Visit: Payer: Self-pay | Admitting: Cardiovascular Disease

## 2024-05-31 DIAGNOSIS — J321 Chronic frontal sinusitis: Secondary | ICD-10-CM | POA: Diagnosis not present

## 2024-05-31 DIAGNOSIS — J342 Deviated nasal septum: Secondary | ICD-10-CM | POA: Diagnosis not present

## 2024-05-31 DIAGNOSIS — J322 Chronic ethmoidal sinusitis: Secondary | ICD-10-CM | POA: Diagnosis not present

## 2024-05-31 DIAGNOSIS — J3489 Other specified disorders of nose and nasal sinuses: Secondary | ICD-10-CM | POA: Diagnosis not present

## 2024-06-08 ENCOUNTER — Other Ambulatory Visit: Payer: Self-pay | Admitting: Cardiovascular Disease

## 2024-07-06 ENCOUNTER — Other Ambulatory Visit: Payer: Self-pay | Admitting: Cardiovascular Disease

## 2024-08-02 ENCOUNTER — Other Ambulatory Visit: Payer: Self-pay

## 2024-08-05 ENCOUNTER — Ambulatory Visit: Payer: Medicare HMO | Admitting: Family Medicine

## 2024-08-05 ENCOUNTER — Encounter: Payer: Self-pay | Admitting: Family Medicine

## 2024-08-05 VITALS — BP 128/62 | HR 57 | Temp 98.3°F | Resp 13 | Ht 66.25 in | Wt 186.0 lb

## 2024-08-05 DIAGNOSIS — Z23 Encounter for immunization: Secondary | ICD-10-CM | POA: Diagnosis not present

## 2024-08-05 DIAGNOSIS — Z0000 Encounter for general adult medical examination without abnormal findings: Secondary | ICD-10-CM

## 2024-08-05 DIAGNOSIS — Z125 Encounter for screening for malignant neoplasm of prostate: Secondary | ICD-10-CM | POA: Diagnosis not present

## 2024-08-05 DIAGNOSIS — Z951 Presence of aortocoronary bypass graft: Secondary | ICD-10-CM | POA: Diagnosis not present

## 2024-08-05 DIAGNOSIS — J321 Chronic frontal sinusitis: Secondary | ICD-10-CM | POA: Diagnosis not present

## 2024-08-05 DIAGNOSIS — E785 Hyperlipidemia, unspecified: Secondary | ICD-10-CM

## 2024-08-05 DIAGNOSIS — E039 Hypothyroidism, unspecified: Secondary | ICD-10-CM | POA: Diagnosis not present

## 2024-08-05 DIAGNOSIS — I25700 Atherosclerosis of coronary artery bypass graft(s), unspecified, with unstable angina pectoris: Secondary | ICD-10-CM

## 2024-08-05 LAB — COMPREHENSIVE METABOLIC PANEL WITH GFR
ALT: 13 U/L (ref 0–53)
AST: 17 U/L (ref 0–37)
Albumin: 4 g/dL (ref 3.5–5.2)
Alkaline Phosphatase: 43 U/L (ref 39–117)
BUN: 14 mg/dL (ref 6–23)
CO2: 32 meq/L (ref 19–32)
Calcium: 9.4 mg/dL (ref 8.4–10.5)
Chloride: 103 meq/L (ref 96–112)
Creatinine, Ser: 0.75 mg/dL (ref 0.40–1.50)
GFR: 86.34 mL/min (ref >60.00)
Glucose, Bld: 96 mg/dL (ref 70–99)
Potassium: 4.1 meq/L (ref 3.5–5.1)
Sodium: 141 meq/L (ref 135–145)
Total Bilirubin: 0.8 mg/dL (ref 0.2–1.2)
Total Protein: 6.5 g/dL (ref 6.0–8.3)

## 2024-08-05 LAB — LIPID PANEL
Cholesterol: 97 mg/dL (ref 0–200)
HDL: 34.2 mg/dL — ABNORMAL LOW (ref >39.00)
LDL Cholesterol: 52 mg/dL (ref 0–99)
NonHDL: 63.01
Total CHOL/HDL Ratio: 3
Triglycerides: 57 mg/dL (ref 0.0–149.0)
VLDL: 11.4 mg/dL (ref 0.0–40.0)

## 2024-08-05 LAB — TSH: TSH: 3.75 u[IU]/mL (ref 0.35–5.50)

## 2024-08-05 LAB — PSA: PSA: 0.67 ng/mL (ref 0.10–4.00)

## 2024-08-05 MED ORDER — LEVOTHYROXINE SODIUM 100 MCG PO TABS: 100.0000 ug | ORAL_TABLET | Freq: Every day | ORAL | 3 refills | Status: AC

## 2024-08-05 NOTE — Progress Notes (Signed)
 Subjective:  Patient ID: Terry Vega, male    DOB: Jul 02, 1946  Age: 78 y.o. MRN: [id number]  CC:  Chief Complaint  Patient presents with   Annual Exam    Pt is doing okay, notes no questions at time of check in, pt is fasting    HPI Terry Vega presents for Annual Exam Doing well.   PCP, [facility], Dr. Darron.  CAD status post CABG earlier this year. Occluded grafts in March.  Attempted PCI. Added Imdur . And cardiac rehab. Appointment  in April.  Started on Zetia  10 mg daily, continued atorvastatin  40 mg daily.  History of PVCs but no evidence of recurrent palpitations on carvedilol .  Hypertension well-controlled including use of carvedilol  and Imdur .  Prior CAD symptoms improved with cardiac rehab and antianginal therapy, given improvement in symptoms held off on attempted left circumflex PCI.  No other med changes.  32-month follow-up planned. ENT, Dr. Luciano, chronic frontal sinusitis, ethmoid sinusitis and deviated nasal septum.  Office visit in July.  On saline nasal irrigatiion, budesonide flush once per day, claritin.  Optometry, Dr. Cleotilde  Cardiac As above.  Continues on carvedilol , Imdur , Plavix .  No CP/DOE. No new bleeding BP Readings from Last 3 Encounters:  08/05/24 128/62  04/09/24 (!) 148/80  03/29/24 (!) 160/73   Lab Results  Component Value Date   CREATININE 0.83 01/30/2024   Lab Results  Component Value Date   CHOL 99 (L) 05/13/2024   HDL 35 (L) 05/13/2024   LDLCALC 48 05/13/2024   TRIG 74 05/13/2024   CHOLHDL 2.8 05/13/2024     Hypothyroidism: Lab Results  Component Value Date   TSH 4.40 08/04/2023  Synthroid  100 mcg daily Taking medication daily.  No new hot or cold intolerance. No new hair or skin changes, heart palpitations or new fatigue. No new weight changes. Some weight loss after cardiac surgery with rehab and new diet. Walking 1 mile per day 4 days per week.        08/05/2024    7:50 AM 04/17/2024    3:35 PM 01/22/2024     2:09 PM 08/04/2023    7:52 AM 05/31/2023    9:37 AM  Depression screen PHQ 2/9  Decreased Interest 0 0 0 0 0  Down, Depressed, Hopeless 0 0 0 0 0  PHQ - 2 Score 0 0 0 0 0  Altered sleeping 0 0 1 0 0  Tired, decreased energy 0 0 1 1 0  Change in appetite 0 0 1 0 0  Feeling bad or failure about yourself  0 0 0 0 0  Trouble concentrating 0 0 0 0 0  Moving slowly or fidgety/restless 0 0 0 0 0  Suicidal thoughts 0 0 0 0 0  PHQ-9 Score 0 0 3 1 0  Difficult doing work/chores Not difficult at all Not difficult at all Not difficult at all Not difficult at all Not difficult at all    Health Maintenance  Topic Date Due   Medicare Annual Wellness (AWV)  05/30/2024   Influenza Vaccine  06/14/2024   COVID-19 Vaccine (5 - 2025-26 season) 07/15/2024   DTaP/Tdap/Td (3 - Tdap) 05/09/2029   Pneumococcal Vaccine: 50+ Years  Completed   Hepatitis C Screening  Completed   Zoster Vaccines- Shingrix  Completed   HPV VACCINES  Aged Out   Meningococcal B Vaccine  Aged Out   Colonoscopy  Discontinued  Colonoscopy 05/06/2015 planned repeat 10 years. Prostate: does not have family history of  prostate cancer The natural history of prostate cancer and ongoing controversy regarding screening and potential treatment outcomes of prostate cancer has been discussed with the patient. The meaning of a false positive PSA and a false negative PSA has been discussed.  Other risk based on age has also been discussed.  He indicates understanding of the limitations of this screening test and wishes  to proceed with screening PSA testing.  Lab Results  Component Value Date   PSA1 0.7 09/03/2020   PSA1 0.5 05/03/2019   PSA1 0.6 04/23/2018   PSA 0.72 08/04/2023   PSA 0.61 08/01/2022   PSA 0.79 08/18/2021     Immunization History  Administered Date(s) Administered    sv, Bivalent, Protein Subunit Rsvpref,pf [place]) 07/11/2024   Fluad Quad(high Dose 65+) 07/23/2020, 07/28/2021, 08/01/2022   Fluad Trivalent(High Dose  65+) 08/04/2023   INFLUENZA, HIGH DOSE SEASONAL PF 08/29/2017, 08/23/2018, 08/14/2019   Influenza Split 08/13/2014   Influenza,inj,Quad PF,6+ Mos 10/09/2013   Influenza-Unspecified 08/24/2016, 08/29/2018   Moderna Sars-Covid-2 Vaccination 12/19/2019, 01/14/2020, 09/29/2020   PFIZER(Purple Top)SARS-COV-2 Vaccination 08/29/2022   Pneumococcal Conjugate-13 09/11/2014   Pneumococcal Polysaccharide-23 04/20/2017   Td 12/23/2008, 05/10/2019   Zoster Recombinant(Shingrix) 08/14/2023, 04/30/2024   Zoster, Live 04/03/2016, 07/13/2016  Flu vaccine - today.  Covid booster - at pharmacy   No results found. See optho yearly - Dr. Cleotilde. Appt tomorrow for cataract surgery screening.   Dental:Within Last 6 months  Alcohol: 1 beer per month.   Tobacco: none  Exercise: walking 1 mi 4d/week.   Lab Results  Component Value Date   HGBA1C 5.2 12/18/2023    History Patient Active Problem List   Diagnosis Date Noted   Chronic frontal sinusitis 05/03/2024   Hyperlipidemia LDL goal <55 03/07/2024   Coronary artery disease involving coronary bypass graft of native heart with unstable angina pectoris (HCC) 01/31/2024   S/P CABG x 5 12/19/2023   Unstable angina (HCC) 12/15/2023   Leg edema 05/06/2014   Frequent PVCs 10/15/2013   High blood pressure 10/08/2012   Hypothyroid 10/08/2012   Past Medical History:  Diagnosis Date   Allergy    Arthritis    [employer]reesia 07/21/2020   CAD (coronary artery disease)    a. 11/2023 Cath: LM 24m/d, LAD 70p/m, D1 99, D2 80, LCX 70ost, 75m/d, OM2 90, RCA 40p/m, 90d, EF 55-65%; b. 12/2023 CABG x 5: LIMA->LAD, VG->D1, VG->D2, VG->OM, VG->dRCA.   Diastolic dysfunction    a. 07/2021 Echo: EF 60-65%.  Mild LVH.  Grade 1 DD.  Normal RV fxn. mildly dil LA. Triv MR/AI. Mild AoV sclerosis.   Heart murmur    Hypertension    Hypothyroidism    PVC's (premature ventricular contractions)    Past Surgical History:  Procedure Laterality Date   COLONOSCOPY     CORONARY  ARTERY BYPASS GRAFT N/A 12/19/2023   Procedure: CORONARY ARTERY BYPASS GRAFTING (CABG) TIMES FIVE USING LEFT INTERNAL MAMMARY ARTERY AND BILATERAL ENDOSCOPICALLY HARVESTED GREATER SAPHENOUS VEIN;  Surgeon: Lucas Dorise POUR, MD;  Location: [facility] OR;  Service: Open Heart Surgery;  Laterality: N/A;   CORONARY STENT INTERVENTION N/A 01/31/2024   Procedure: CORONARY STENT INTERVENTION;  Surgeon: Mady Bruckner, MD;  Location: [facility] INVASIVE CV LAB;  Service: Cardiovascular;  Laterality: N/A;   HEMORRHOID SURGERY     LEFT HEART CATH AND CORONARY ANGIOGRAPHY Left 12/15/2023   Procedure: LEFT HEART CATH AND CORONARY ANGIOGRAPHY;  Surgeon: Darron Deatrice LABOR, MD;  Location: [facility] INVASIVE CV LAB;  Service: Cardiovascular;  Laterality: Left;  LEFT HEART CATH AND CORS/GRAFTS ANGIOGRAPHY Left 01/31/2024   Procedure: LEFT HEART CATH AND CORS/GRAFTS ANGIOGRAPHY;  Surgeon: Mady Bruckner, MD;  Location: [facility] INVASIVE CV LAB;  Service: Cardiovascular;  Laterality: Left;   TEE WITHOUT CARDIOVERSION N/A 12/19/2023   Procedure: TRANSESOPHAGEAL ECHOCARDIOGRAM (TEE);  Surgeon: Lucas Dorise POUR, MD;  Location: [facility] OR;  Service: Open Heart Surgery;  Laterality: N/A;   VASECTOMY     No Known Allergies Prior to Admission medications   Medication Sig Start Date End Date Taking? Authorizing Provider  aspirin  EC 81 MG tablet Take 1 tablet (81 mg total) by mouth daily. Swallow whole. 01/31/24 01/30/25 Yes End, Bruckner, MD  atorvastatin  (LIPITOR) 40 MG tablet Take 1 tablet by mouth once daily 06/10/24  Yes Darron Deatrice LABOR, MD  carvedilol  (COREG ) 6.25 MG tablet Take 1 tablet (6.25 mg total) by mouth 2 (two) times daily. 10/17/23  Yes Darron Deatrice LABOR, MD  cholecalciferol (VITAMIN D ) 1000 UNITS tablet Take 1,000 Units by mouth daily.   Yes [provider]  clopidogrel  (PLAVIX ) 75 MG tablet Take 1 tablet (75 mg total) by mouth daily. 04/02/24 04/02/25 Yes End, Bruckner, MD  ezetimibe  (ZETIA ) 10 MG tablet Take 1 tablet (10 mg  total) by mouth daily. 03/12/24 08/05/24 Yes Darron Deatrice LABOR, MD  isosorbide  mononitrate (IMDUR ) 60 MG 24 hr tablet TAKE 1 TABLET EVERY DAY 07/09/24  Yes Darron Deatrice LABOR, MD  levothyroxine  (SYNTHROID ) 100 MCG tablet Take 1 tablet (100 mcg total) by mouth daily. 08/04/23  Yes Levora Reyes SAUNDERS, MD  loratadine (CLARITIN) 10 MG tablet Take 10 mg by mouth daily.   Yes [provider]  Multiple Vitamin (MULTIVITAMIN) tablet Take 1 tablet by mouth daily.   Yes [provider]  nitroGLYCERIN  (NITROSTAT ) 0.4 MG SL tablet Place 0.4 mg under the tongue every 5 (five) minutes as needed for chest pain.   Yes [provider]  triamcinolone  cream (KENALOG ) 0.1 % Apply 1 Application topically daily as needed (irritation). 12/24/23  Yes Zimmerman, Donielle M, PA-C  vitamin E 1000 UNIT capsule Take 1,000 Units by mouth daily.   Yes [provider]   Social History   Socioeconomic History   Marital status: Married    Spouse name: Not on file   Number of children: Not on file   Years of education: Not on file   Highest education level: Some college, no degree  Occupational History   Not on file  Tobacco Use   Smoking status: Never   Smokeless tobacco: Never  Vaping Use   Vaping status: Never Used  Substance and Sexual Activity   Alcohol use: Yes    Comment: occasionally   Drug use: No   Sexual activity: Not Currently    Birth control/protection: None  Other Topics Concern   Not on file  Social History Narrative   Lives locally.  Active but does not routinely exercise.  Daughter works for our Financial risk analyst as Charity fundraiser.   Social Drivers of Corporate investment banker Strain: Low Risk  (08/01/2024)   Overall Financial Resource Strain (CARDIA)    Difficulty of Paying Living Expenses: Not very hard  Food Insecurity: No Food Insecurity (08/01/2024)   Hunger Vital Sign    Worried About Running Out of Food in the Last Year: Never true    Ran Out of Food in the Last Year: Never  true  Transportation Needs: No Transportation Needs (08/01/2024)   PRAPARE - Administrator, Civil Service (Medical): No  Lack of Transportation (Non-Medical): No  Physical Activity: Insufficiently Active (08/01/2024)   Exercise Vital Sign    Days of Exercise per Week: 4 days    Minutes of Exercise per Session: 30 min  Stress: No Stress Concern Present (08/01/2024)   [employer] of Occupational Health - Occupational Stress Questionnaire    Feeling of Stress: Only a little  Social Connections: Socially Integrated (08/01/2024)   Social Connection and Isolation Panel    Frequency of Communication with Friends and Family: Three times a week    Frequency of Social Gatherings with Friends and Family: Twice a week    Attends Religious Services: More than 4 times per year    Active Member of [employer] or Organizations: Yes    Attends Engineer, structural: More than 4 times per year    Marital Status: Married  Catering manager Violence: Not At Risk (12/26/2023)   Humiliation, Afraid, Rape, and Kick questionnaire    Fear of Current or Ex-Partner: No    Emotionally Abused: No    Physically Abused: No    Sexually Abused: No    Review of Systems 13 point review of systems per patient health survey noted.  Negative other than as indicated above or in HPI.    Objective:   Vitals:   08/05/24 0755  BP: 128/62  Pulse: (!) 57  Resp: 13  Temp: 98.3 F (36.8 C)  TempSrc: Temporal  SpO2: 97%  Weight: 186 lb (84.4 kg)  Height: 5' 6.25 (1.683 m)     Physical Exam Vitals reviewed.  Constitutional:      Appearance: He is well-developed.  HENT:     Head: Normocephalic and atraumatic.     Right Ear: External ear normal.     Left Ear: External ear normal.  Eyes:     Conjunctiva/sclera: Conjunctivae normal.     Pupils: Pupils are equal, round, and reactive to light.  Neck:     Thyroid : No thyromegaly.  Cardiovascular:     Rate and Rhythm: Normal rate and regular  rhythm.     Heart sounds: Normal heart sounds.  Pulmonary:     Effort: Pulmonary effort is normal. No respiratory distress.     Breath sounds: Normal breath sounds. No wheezing.  Abdominal:     General: There is no distension.     Palpations: Abdomen is soft.     Tenderness: There is no abdominal tenderness.  Musculoskeletal:        General: No tenderness. Normal range of motion.     Cervical back: Normal range of motion and neck supple.  Lymphadenopathy:     Cervical: No cervical adenopathy.  Skin:    General: Skin is warm and dry.  Neurological:     Mental Status: He is alert and oriented to person, place, and time.     Deep Tendon Reflexes: Reflexes are normal and symmetric.  Psychiatric:        Behavior: Behavior normal.        Assessment & Plan:  Terry Vega is a 78 y.o. male . Annual physical exam  - -anticipatory guidance as below in AVS, screening labs above. Health maintenance items as above in HPI discussed/recommended as applicable.   Need for immunization against influenza - Plan: Flu vaccine HIGH DOSE PF(Fluzone Trivalent)  Hypothyroidism, unspecified type - Plan: TSH, levothyroxine  (SYNTHROID ) 100 MCG tablet  -  Stable, tolerating current regimen. Medications refilled. Labs pending as above.   Coronary artery disease involving coronary bypass  graft of native heart with unstable angina pectoris (HCC), S/P CABG x 5  - Asymptomatic, tolerating current med regimen, continue follow-up with cardiology.  Hyperlipidemia LDL goal <55 - Plan: Comprehensive metabolic panel with GFR, Lipid panel  - Stable on most recent lipids, requests repeat lipid panel.  Continue same meds and cardiology follow-up.  Chronic frontal sinusitis  - Continue same med regimen from ENT and follow-up with ENT as planned  Screening for prostate cancer - Plan: PSA   Meds ordered this encounter  Medications   levothyroxine  (SYNTHROID ) 100 MCG tablet    Sig: Take 1 tablet (100 mcg  total) by mouth daily.    Dispense:  90 tablet    Refill:  3   Patient Instructions  Thank you for coming in today. No change in medications at this time. If there are any concerns on your bloodwork, I will let you know. Take care! Preventive Care 40 Years and Older, Male Preventive care refers to lifestyle choices and visits with your health care provider that can promote health and wellness. Preventive care visits are also called wellness exams. What can I expect for my preventive care visit? Counseling During your preventive care visit, your health care provider may ask about your: Medical history, including: Past medical problems. Family medical history. History of falls. Current health, including: Emotional well-being. Home life and relationship well-being. Sexual activity. Memory and ability to understand (cognition). Lifestyle, including: Alcohol, nicotine or tobacco, and drug use. Access to firearms. Diet, exercise, and sleep habits. Work and work Astronomer. Sunscreen use. Safety issues such as seatbelt and bike helmet use. Physical exam Your health care provider will check your: Height and weight. These may be used to calculate your BMI (body mass index). BMI is a measurement that tells if you are at a healthy weight. Waist circumference. This measures the distance around your waistline. This measurement also tells if you are at a healthy weight and may help predict your risk of certain diseases, such as type 2 diabetes and high blood pressure. Heart rate and blood pressure. Body temperature. Skin for abnormal spots. What immunizations do I need?  Vaccines are usually given at various ages, according to a schedule. Your health care provider will recommend vaccines for you based on your age, medical history, and lifestyle or other factors, such as travel or where you work. What tests do I need? Screening Your health care provider may recommend screening tests for  certain conditions. This may include: Lipid and cholesterol levels. Diabetes screening. This is done by checking your blood sugar (glucose) after you have not eaten for a while (fasting). Hepatitis C test. Hepatitis B test. HIV (human immunodeficiency virus) test. STI (sexually transmitted infection) testing, if you are at risk. Lung cancer screening. Colorectal cancer screening. Prostate cancer screening. Abdominal aortic aneurysm (AAA) screening. You may need this if you are a current or former smoker. Talk with your health care provider about your test results, treatment options, and if necessary, the need for more tests. Follow these instructions at home: Eating and drinking  Eat a diet that includes fresh fruits and vegetables, whole grains, lean protein, and low-fat dairy products. Limit your intake of foods with high amounts of sugar, saturated fats, and salt. Take vitamin and mineral supplements as recommended by your health care provider. Do not drink alcohol if your health care provider tells you not to drink. If you drink alcohol: Limit how much you have to 0-2 drinks a day. Know how much  alcohol is in your drink. In the [place] one drink equals one 12 oz bottle of beer (355 mL), one 5 oz glass of wine (148 mL), or one 1 oz glass of hard liquor (44 mL). Lifestyle Brush your teeth every morning and night with fluoride toothpaste. Floss one time each day. Exercise for at least 30 minutes 5 or more days each week. Do not use any products that contain nicotine or tobacco. These products include cigarettes, chewing tobacco, and vaping devices, such as e-cigarettes. If you need help quitting, ask your health care provider. Do not use drugs. If you are sexually active, practice safe sex. Use a condom or other form of protection to prevent STIs. Take aspirin  only as told by your health care provider. Make sure that you understand how much to take and what form to take. Work with your  health care provider to find out whether it is safe and beneficial for you to take aspirin  daily. Ask your health care provider if you need to take a cholesterol-lowering medicine (statin). Find healthy ways to manage stress, such as: Meditation, yoga, or listening to music. Journaling. Talking to a trusted person. Spending time with friends and family. Safety Always wear your seat belt while driving or riding in a vehicle. Do not drive: If you have been drinking alcohol. Do not ride with someone who has been drinking. When you are tired or distracted. While texting. If you have been using any mind-altering substances or drugs. Wear a helmet and other protective equipment during sports activities. If you have firearms in your house, make sure you follow all gun safety procedures. Minimize exposure to UV radiation to reduce your risk of skin cancer. What's next? Visit your health care provider once a year for an annual wellness visit. Ask your health care provider how often you should have your eyes and teeth checked. Stay up to date on all vaccines. This information is not intended to replace advice given to you by your health care provider. Make sure you discuss any questions you have with your health care provider. Document Revised: 04/28/2021 Document Reviewed: 04/28/2021 Elsevier Patient Education  2024 Elsevier Inc.    Signed,   Reyes Pines, MD [facility] 08/05/24 8:28 AM

## 2024-08-05 NOTE — Patient Instructions (Addendum)
 Thank you for coming in today. No change in medications at this time. If there are any concerns on your bloodwork, I will let you know. Take care!  Preventive Care 23 Years and Older, Male Preventive care refers to lifestyle choices and visits with your health care provider that can promote health and wellness. Preventive care visits are also called wellness exams. What can I expect for my preventive care visit? Counseling During your preventive care visit, your health care provider may ask about your: Medical history, including: Past medical problems. Family medical history. History of falls. Current health, including: Emotional well-being. Home life and relationship well-being. Sexual activity. Memory and ability to understand (cognition). Lifestyle, including: Alcohol, nicotine or tobacco, and drug use. Access to firearms. Diet, exercise, and sleep habits. Work and work Astronomer. Sunscreen use. Safety issues such as seatbelt and bike helmet use. Physical exam Your health care provider will check your: Height and weight. These may be used to calculate your BMI (body mass index). BMI is a measurement that tells if you are at a healthy weight. Waist circumference. This measures the distance around your waistline. This measurement also tells if you are at a healthy weight and may help predict your risk of certain diseases, such as type 2 diabetes and high blood pressure. Heart rate and blood pressure. Body temperature. Skin for abnormal spots. What immunizations do I need?  Vaccines are usually given at various ages, according to a schedule. Your health care provider will recommend vaccines for you based on your age, medical history, and lifestyle or other factors, such as travel or where you work. What tests do I need? Screening Your health care provider may recommend screening tests for certain conditions. This may include: Lipid and cholesterol levels. Diabetes screening.  This is done by checking your blood sugar (glucose) after you have not eaten for a while (fasting). Hepatitis C test. Hepatitis B test. HIV (human immunodeficiency virus) test. STI (sexually transmitted infection) testing, if you are at risk. Lung cancer screening. Colorectal cancer screening. Prostate cancer screening. Abdominal aortic aneurysm (AAA) screening. You may need this if you are a current or former smoker. Talk with your health care provider about your test results, treatment options, and if necessary, the need for more tests. Follow these instructions at home: Eating and drinking  Eat a diet that includes fresh fruits and vegetables, whole grains, lean protein, and low-fat dairy products. Limit your intake of foods with high amounts of sugar, saturated fats, and salt. Take vitamin and mineral supplements as recommended by your health care provider. Do not drink alcohol if your health care provider tells you not to drink. If you drink alcohol: Limit how much you have to 0-2 drinks a day. Know how much alcohol is in your drink. In the [place] one drink equals one 12 oz bottle of beer (355 mL), one 5 oz glass of wine (148 mL), or one 1 oz glass of hard liquor (44 mL). Lifestyle Brush your teeth every morning and night with fluoride toothpaste. Floss one time each day. Exercise for at least 30 minutes 5 or more days each week. Do not use any products that contain nicotine or tobacco. These products include cigarettes, chewing tobacco, and vaping devices, such as e-cigarettes. If you need help quitting, ask your health care provider. Do not use drugs. If you are sexually active, practice safe sex. Use a condom or other form of protection to prevent STIs. Take aspirin only as told by your health  care provider. Make sure that you understand how much to take and what form to take. Work with your health care provider to find out whether it is safe and beneficial for you to take aspirin  daily. Ask your health care provider if you need to take a cholesterol-lowering medicine (statin). Find healthy ways to manage stress, such as: Meditation, yoga, or listening to music. Journaling. Talking to a trusted person. Spending time with friends and family. Safety Always wear your seat belt while driving or riding in a vehicle. Do not drive: If you have been drinking alcohol. Do not ride with someone who has been drinking. When you are tired or distracted. While texting. If you have been using any mind-altering substances or drugs. Wear a helmet and other protective equipment during sports activities. If you have firearms in your house, make sure you follow all gun safety procedures. Minimize exposure to UV radiation to reduce your risk of skin cancer. What's next? Visit your health care provider once a year for an annual wellness visit. Ask your health care provider how often you should have your eyes and teeth checked. Stay up to date on all vaccines. This information is not intended to replace advice given to you by your health care provider. Make sure you discuss any questions you have with your health care provider. Document Revised: 04/28/2021 Document Reviewed: 04/28/2021 Elsevier Patient Education  2024 [employer].

## 2024-08-06 DIAGNOSIS — H18413 Arcus senilis, bilateral: Secondary | ICD-10-CM | POA: Diagnosis not present

## 2024-08-06 DIAGNOSIS — H35372 Puckering of macula, left eye: Secondary | ICD-10-CM | POA: Diagnosis not present

## 2024-08-06 DIAGNOSIS — H25043 Posterior subcapsular polar age-related cataract, bilateral: Secondary | ICD-10-CM | POA: Diagnosis not present

## 2024-08-06 DIAGNOSIS — H2512 Age-related nuclear cataract, left eye: Secondary | ICD-10-CM | POA: Diagnosis not present

## 2024-08-06 DIAGNOSIS — H2513 Age-related nuclear cataract, bilateral: Secondary | ICD-10-CM | POA: Diagnosis not present

## 2024-08-09 ENCOUNTER — Ambulatory Visit: Payer: Self-pay | Admitting: Family Medicine

## 2024-09-03 ENCOUNTER — Ambulatory Visit: Admitting: *Deleted

## 2024-09-03 VITALS — Ht 67.0 in | Wt 186.0 lb

## 2024-09-03 DIAGNOSIS — Z0000 Encounter for general adult medical examination without abnormal findings: Secondary | ICD-10-CM

## 2024-09-03 NOTE — Progress Notes (Unsigned)
 Subjective:   Terry Vega is a 78 y.o. who presents for a Medicare Wellness preventive visit.  As a reminder, Annual Wellness Visits don't include a physical exam, and some assessments may be limited, especially if this visit is performed virtually. We may recommend an in-person follow-up visit with your provider if needed.  Visit Complete: {VISITMETHODVS:[phone]}  {AWVVIDEO:32072}  Persons Participating in Visit: {Persons Participating in Visit:32444}  AWV Questionnaire: {AWVQuestionnaire:32338}        Objective:    Today's Vitals   09/03/24 1526  Weight: 186 lb (84.4 kg)  Height: 5' 7 (1.702 m)   Body mass index is 29.13 kg/m.     09/03/2024    3:25 PM 12/19/2023    6:37 AM 05/31/2023    9:33 AM 09/01/2022    2:46 PM 08/01/2022    8:03 AM 04/30/2019   10:04 AM 04/23/2018    8:30 AM  Advanced Directives  Does Patient Have a Medical Advance Directive? Yes Yes Yes Yes Yes Yes Yes   Type of Estate agent of [employer] Power of [place];Living will Healthcare Power of [employer] of [place];Living will  Healthcare Power of [place];Living will Living will  Does patient want to make changes to medical advance directive?    No - Patient declined Yes (ED - send information to MyChart) No - Patient declined  No - Patient declined   Copy of Healthcare Power of Attorney in Chart? Yes - validated most recent copy scanned in chart (See row information) No - copy requested Yes - validated most recent copy scanned in chart (See row information) Yes - validated most recent copy scanned in chart (See row information)        Data saved with a previous flowsheet row definition    Current Medications (verified) Outpatient Encounter Medications as of 09/03/2024  Medication Sig   aspirin  EC 81 MG tablet Take 1 tablet (81 mg total) by mouth daily. Swallow whole.   atorvastatin  (LIPITOR) 40 MG tablet Take 1 tablet by mouth once daily    carvedilol  (COREG ) 6.25 MG tablet Take 1 tablet (6.25 mg total) by mouth 2 (two) times daily.   cholecalciferol (VITAMIN D ) 1000 UNITS tablet Take 1,000 Units by mouth daily.   clopidogrel  (PLAVIX ) 75 MG tablet Take 1 tablet (75 mg total) by mouth daily.   ezetimibe  (ZETIA ) 10 MG tablet Take 1 tablet (10 mg total) by mouth daily.   isosorbide  mononitrate (IMDUR ) 60 MG 24 hr tablet TAKE 1 TABLET EVERY DAY   levothyroxine  (SYNTHROID ) 100 MCG tablet Take 1 tablet (100 mcg total) by mouth daily.   loratadine (CLARITIN) 10 MG tablet Take 10 mg by mouth daily.   Multiple Vitamin (MULTIVITAMIN) tablet Take 1 tablet by mouth daily.   nitroGLYCERIN  (NITROSTAT ) 0.4 MG SL tablet Place 0.4 mg under the tongue every 5 (five) minutes as needed for chest pain.   triamcinolone  cream (KENALOG ) 0.1 % Apply 1 Application topically daily as needed (irritation).   vitamin E 1000 UNIT capsule Take 1,000 Units by mouth daily.   No facility-administered encounter medications on file as of 09/03/2024.    Allergies (verified) Patient has no known allergies.   History: Past Medical History:  Diagnosis Date   Allergy    Arthritis    [employer] 07/21/2020   CAD (coronary artery disease)    a. 11/2023 Cath: LM 20m/d, LAD 70p/m, D1 99, D2 80, LCX 70ost, 28m/d, OM2 90, RCA 40p/m, 90d, EF 55-65%; b. 12/2023  CABG x 5: LIMA->LAD, VG->D1, VG->D2, VG->OM, VG->dRCA.   Diastolic dysfunction    a. 07/2021 Echo: EF 60-65%.  Mild LVH.  Grade 1 DD.  Normal RV fxn. mildly dil LA. Triv MR/AI. Mild AoV sclerosis.   Heart murmur    Hypertension    Hypothyroidism    PVC's (premature ventricular contractions)    Past Surgical History:  Procedure Laterality Date   COLONOSCOPY     CORONARY ARTERY BYPASS GRAFT N/A 12/19/2023   Procedure: CORONARY ARTERY BYPASS GRAFTING (CABG) TIMES FIVE USING LEFT INTERNAL MAMMARY ARTERY AND BILATERAL ENDOSCOPICALLY HARVESTED GREATER SAPHENOUS VEIN;  Surgeon: Lucas Dorise POUR, MD;  Location: [facility] OR;   Service: Open Heart Surgery;  Laterality: N/A;   CORONARY STENT INTERVENTION N/A 01/31/2024   Procedure: CORONARY STENT INTERVENTION;  Surgeon: Mady Bruckner, MD;  Location: [facility] INVASIVE CV LAB;  Service: Cardiovascular;  Laterality: N/A;   HEMORRHOID SURGERY     LEFT HEART CATH AND CORONARY ANGIOGRAPHY Left 12/15/2023   Procedure: LEFT HEART CATH AND CORONARY ANGIOGRAPHY;  Surgeon: Darron Deatrice LABOR, MD;  Location: [facility] INVASIVE CV LAB;  Service: Cardiovascular;  Laterality: Left;   LEFT HEART CATH AND CORS/GRAFTS ANGIOGRAPHY Left 01/31/2024   Procedure: LEFT HEART CATH AND CORS/GRAFTS ANGIOGRAPHY;  Surgeon: Mady Bruckner, MD;  Location: [facility] INVASIVE CV LAB;  Service: Cardiovascular;  Laterality: Left;   TEE WITHOUT CARDIOVERSION N/A 12/19/2023   Procedure: TRANSESOPHAGEAL ECHOCARDIOGRAM (TEE);  Surgeon: Lucas Dorise POUR, MD;  Location[facility];  Service: Open Heart Surgery;  Laterality: N/A;   VASECTOMY     Family History  Problem Relation Age of Onset   Heart disease Mother    Cancer Father    Diabetes Father    Stroke Maternal Grandmother    Diabetes Maternal Grandmother    Colon cancer Neg Hx    Esophageal cancer Neg Hx    Rectal cancer Neg Hx    Stomach cancer Neg Hx    Social History   Socioeconomic History   Marital status: Married    Spouse name: Not on file   Number of children: Not on file   Years of education: Not on file   Highest education level: Some college, no degree  Occupational History   Not on file  Tobacco Use   Smoking status: Never   Smokeless tobacco: Never  Vaping Use   Vaping status: Never Used  Substance and Sexual Activity   Alcohol use: Yes    Comment: occasionally   Drug use: No   Sexual activity: Not Currently    Birth control/protection: None  Other Topics Concern   Not on file  Social History Narrative   Lives locally.  Active but does not routinely exercise.  Daughter works for our Financial risk analyst as Charity fundraiser.   Social Drivers of Manufacturing engineer Strain: Low Risk  (09/02/2024)   Overall Financial Resource Strain (CARDIA)    Difficulty of Paying Living Expenses: Not hard at all  Food Insecurity: No Food Insecurity (09/02/2024)   Hunger Vital Sign    Worried About Running Out of Food in the Last Year: Never true    Ran Out of Food in the Last Year: Never true  Transportation Needs: No Transportation Needs (09/02/2024)   PRAPARE - Administrator, Civil Service (Medical): No    Lack of Transportation (Non-Medical): No  Physical Activity: Insufficiently Active (09/02/2024)   Exercise Vital Sign    Days of Exercise per Week: 4 days  Minutes of Exercise per Session: 30 min  Stress: No Stress Concern Present (09/02/2024)   [employer] of Occupational Health - Occupational Stress Questionnaire    Feeling of Stress: Not at all  Social Connections: Socially Integrated (09/02/2024)   Social Connection and Isolation Panel    Frequency of Communication with Friends and Family: More than three times a week    Frequency of Social Gatherings with Friends and Family: Twice a week    Attends Religious Services: More than 4 times per year    Active Member of [employer] or Organizations: Yes    Attends Engineer, structural: More than 4 times per year    Marital Status: Married    Tobacco Counseling Counseling given: Not Answered    Clinical Intake:  Pre-visit preparation completed: Yes  Pain : No/denies pain     Diabetes: No  Lab Results  Component Value Date   HGBA1C 5.2 12/18/2023   HGBA1C 5.3 07/28/2021     How often do you need to have someone help you when you read instructions, pamphlets, or other written materials from your doctor or pharmacy?: 1 - Never  Interpreter Needed?: No  Information entered by :: Mliss Graff LPN   Activities of Daily Living ***    09/02/2024   11:43 AM 01/31/2024   12:41 PM  In your present state of health, do you have any difficulty  performing the following activities:  Hearing? 0 0  Vision? 0 0  Difficulty concentrating or making decisions? 0 0  Walking or climbing stairs? 0   Dressing or bathing? 0   Doing errands, shopping? 0   Preparing Food and eating ? N   Using the Toilet? N   In the past six months, have you accidently leaked urine? N   Do you have problems with loss of bowel control? N   Managing your Medications? N   Managing your Finances? N   Housekeeping or managing your Housekeeping? N     Patient Care Team: Levora Reyes SAUNDERS, MD as PCP - General (Family Medicine) Darron Deatrice LABOR, MD as PCP - Cardiology (Cardiology) Darron Deatrice LABOR, MD as Consulting Physician (Cardiology) Cleotilde Elspeth CROME, OD (Optometry) *** I have updated your Care Teams any recent Medical Services you may have received from other providers in the past year.     Assessment:   This is a routine wellness examination for [place].  Hearing/Vision screen No results found.   Goals Addressed             This Visit's Progress    Patient Stated       Continue to lower cholesteral       Depression Screen ***    09/03/2024    3:25 PM 08/05/2024    7:50 AM 04/17/2024    3:35 PM 01/22/2024    2:09 PM 08/04/2023    7:52 AM 05/31/2023    9:37 AM 09/01/2022    2:44 PM  PHQ 2/9 Scores  PHQ - 2 Score 0 0 0 0 0 0 0  PHQ- 9 Score 0 0 0 3 1 0     Fall Risk ***    09/02/2024   11:43 AM 08/05/2024    7:50 AM 04/17/2024    2:11 PM 04/15/2024    3:24 PM 04/10/2024    3:52 PM  Fall Risk   Falls in the past year? 0 0 0 0 0  Number falls in past yr: 0 0 0 0 0  Injury with Fall? 0 0 0 0 0  Risk for fall due to :  No Fall Risks Impaired balance/gait Impaired balance/gait Impaired balance/gait  Follow up  Falls evaluation completed Falls evaluation completed Falls evaluation completed Falls evaluation completed    MEDICARE RISK AT HOME: *** Medicare Risk at Home Any stairs in or around the home?: (Patient-Rptd) Yes If so, are  there any without handrails?: (Patient-Rptd) No Home free of loose throw rugs in walkways, pet beds, electrical cords, etc?: (Patient-Rptd) Yes Adequate lighting in your home to reduce risk of falls?: (Patient-Rptd) Yes Life alert?: (Patient-Rptd) No Use of a cane, Riesen or w/c?: (Patient-Rptd) No Grab bars in the bathroom?: (Patient-Rptd) No Shower chair or bench in shower?: (Patient-Rptd) No Elevated toilet seat or a handicapped toilet?: (Patient-Rptd) No  TIMED UP AND GO:  Was the test performed?  {AMBTIMEDUPGO:[phone]}  Cognitive Function: {CognitiveScreening:32337}        09/03/2024    3:24 PM 08/05/2024    7:58 AM 05/31/2023    9:34 AM 09/01/2022    2:46 PM 08/01/2022    8:02 AM  6CIT Screen  What Year? 0 points 0 points 0 points 0 points 0 points  What month? 0 points 0 points 0 points 0 points 0 points  What time? 0 points 0 points 0 points 0 points 0 points  Count back from 20 0 points 0 points 0 points 0 points 0 points  Months in reverse 0 points 0 points 0 points 0 points 0 points  Repeat phrase 0 points 0 points 0 points 0 points 0 points  Total Score 0 points 0 points 0 points 0 points 0 points    Immunizations Immunization History  Administered Date(s) Administered    sv, Bivalent, Protein Subunit Rsvpref,pf (Abrysvo) 07/11/2024   Fluad Quad(high Dose 65+) 07/23/2020, 07/28/2021, 08/01/2022   Fluad Trivalent(High Dose 65+) 08/04/2023   INFLUENZA, HIGH DOSE SEASONAL PF 08/29/2017, 08/23/2018, 08/14/2019, 08/05/2024   Influenza Split 08/13/2014   Influenza,inj,Quad PF,6+ Mos 10/09/2013   Influenza-Unspecified 08/24/2016, 08/29/2018   Moderna Sars-Covid-2 Vaccination 12/19/2019, 01/14/2020, 09/29/2020   PFIZER(Purple Top)SARS-COV-2 Vaccination 08/29/2022   Pneumococcal Conjugate-13 09/11/2014   Pneumococcal Polysaccharide-23 04/20/2017   Td 12/23/2008, 05/10/2019   Zoster Recombinant(Shingrix) 08/14/2023, 04/30/2024   Zoster, Live 04/03/2016, 07/13/2016     Screening Tests Health Maintenance  Topic Date Due   COVID-19 Vaccine (5 - 2025-26 season) 07/15/2024   Medicare Annual Wellness (AWV)  09/03/2025   DTaP/Tdap/Td (3 - Tdap) 05/09/2029   Pneumococcal Vaccine: 50+ Years  Completed   Influenza Vaccine  Completed   Hepatitis C Screening  Completed   Zoster Vaccines- Shingrix  Completed   Meningococcal B Vaccine  Aged Out   Colonoscopy  Discontinued    Health Maintenance Items Addressed: {HMMCR (Optional):30011}  Additional Screening:  Vision Screening: Recommended annual ophthalmology exams for early detection of glaucoma and other disorders of the eye. Is the patient up to date with their annual eye exam?  {YES/NO:21197} Who is the provider or what is the name of the office in which the patient attends annual eye exams? ***  Dental Screening: Recommended annual dental exams for proper oral hygiene  Community Resource Referral / Chronic Care Management: CRR required this visit?  {YES/NO:21197}  CCM required this visit?  {CCM Required choices:[phone]}   Plan:    I have personally reviewed and noted the following in the patient's chart:   Medical and social history Use of alcohol, tobacco or illicit drugs  Current medications and  supplements including opioid prescriptions. {Opioid Prescriptions:[phone]} Functional ability and status Nutritional status Physical activity Advanced directives List of other physicians Hospitalizations, surgeries, and ER visits in previous 12 months Vitals Screenings to include cognitive, depression, and falls Referrals and appointments  In addition, I have reviewed and discussed with patient certain preventive protocols, quality metrics, and best practice recommendations. A written personalized care plan for preventive services as well as general preventive health recommendations were provided to patient.   Lyle MARLA Right, [place]   09/03/2024   After Visit Summary: {CHL AMB AWV  After Visit Summary:[phone]}  Notes: {Nurse Notes:32343}

## 2024-09-03 NOTE — Progress Notes (Unsigned)
 Subjective:   Terry Vega is a 78 y.o. male who presents for Medicare Annual/Subsequent preventive examination.  Visit Complete: Virtual I connected with  Nelwyn LITTIE Finder on 09/03/24 by a audio enabled telemedicine application and verified that I am speaking with the correct person using two identifiers.  Patient Location: Home  Provider Location: Home Office  I discussed the limitations of evaluation and management by telemedicine. The patient expressed understanding and agreed to proceed.  Vital Signs: Because this visit was a virtual/telehealth visit, some criteria may be missing or patient reported. Any vitals not documented were not able to be obtained and vitals that have been documented are patient reported.  Patient Medicare AWV questionnaire was completed by the patient on 09-02-2024; I have confirmed that all information answered by patient is correct and no changes since this date.  Cardiac Risk Factors include: advanced age (>55men, >3 women);male gender;obesity (BMI >30kg/m2);family history of premature cardiovascular disease     Objective:    Today's Vitals   09/03/24 1526  Weight: 186 lb (84.4 kg)  Height: 5' 7 (1.702 m)   Body mass index is 29.13 kg/m.     09/03/2024    3:48 PM 09/03/2024    3:25 PM 12/19/2023    6:37 AM 05/31/2023    9:33 AM 09/01/2022    2:46 PM 08/01/2022    8:03 AM 04/30/2019   10:04 AM  Advanced Directives  Does Patient Have a Medical Advance Directive? Yes Yes Yes Yes Yes Yes Yes  Type of Sales promotion account executive of [employer] Power of [place];Living will Healthcare Power of [employer] of [place];Living will  Healthcare Power of [place];Living will  Does patient want to make changes to medical advance directive?     No - Patient declined Yes (ED - send information to MyChart) No - Patient declined   Copy of Healthcare Power of Attorney in Chart? Yes - validated most  recent copy scanned in chart (See row information) Yes - validated most recent copy scanned in chart (See row information) No - copy requested Yes - validated most recent copy scanned in chart (See row information) Yes - validated most recent copy scanned in chart (See row information)       Data saved with a previous flowsheet row definition    Current Medications (verified) Outpatient Encounter Medications as of 09/03/2024  Medication Sig   aspirin  EC 81 MG tablet Take 1 tablet (81 mg total) by mouth daily. Swallow whole.   atorvastatin  (LIPITOR) 40 MG tablet Take 1 tablet by mouth once daily   carvedilol  (COREG ) 6.25 MG tablet Take 1 tablet (6.25 mg total) by mouth 2 (two) times daily.   cholecalciferol (VITAMIN D ) 1000 UNITS tablet Take 1,000 Units by mouth daily.   clopidogrel  (PLAVIX ) 75 MG tablet Take 1 tablet (75 mg total) by mouth daily.   ezetimibe  (ZETIA ) 10 MG tablet Take 1 tablet (10 mg total) by mouth daily.   isosorbide  mononitrate (IMDUR ) 60 MG 24 hr tablet TAKE 1 TABLET EVERY DAY   levothyroxine  (SYNTHROID ) 100 MCG tablet Take 1 tablet (100 mcg total) by mouth daily.   loratadine (CLARITIN) 10 MG tablet Take 10 mg by mouth daily.   Multiple Vitamin (MULTIVITAMIN) tablet Take 1 tablet by mouth daily.   nitroGLYCERIN  (NITROSTAT ) 0.4 MG SL tablet Place 0.4 mg under the tongue every 5 (five) minutes as needed for chest pain.   triamcinolone  cream (KENALOG ) 0.1 % Apply 1  Application topically daily as needed (irritation).   vitamin E 1000 UNIT capsule Take 1,000 Units by mouth daily.   No facility-administered encounter medications on file as of 09/03/2024.    Allergies (verified) Patient has no known allergies.   History: Past Medical History:  Diagnosis Date   Allergy    Arthritis    [employer]reesia 07/21/2020   CAD (coronary artery disease)    a. 11/2023 Cath: LM 19m/d, LAD 70p/m, D1 99, D2 80, LCX 70ost, 19m/d, OM2 90, RCA 40p/m, 90d, EF 55-65%; b. 12/2023 CABG x 5:  LIMA->LAD, VG->D1, VG->D2, VG->OM, VG->dRCA.   Diastolic dysfunction    a. 07/2021 Echo: EF 60-65%.  Mild LVH.  Grade 1 DD.  Normal RV fxn. mildly dil LA. Triv MR/AI. Mild AoV sclerosis.   Heart murmur    Hypertension    Hypothyroidism    PVC's (premature ventricular contractions)    Past Surgical History:  Procedure Laterality Date   COLONOSCOPY     CORONARY ARTERY BYPASS GRAFT N/A 12/19/2023   Procedure: CORONARY ARTERY BYPASS GRAFTING (CABG) TIMES FIVE USING LEFT INTERNAL MAMMARY ARTERY AND BILATERAL ENDOSCOPICALLY HARVESTED GREATER SAPHENOUS VEIN;  Surgeon: Lucas Dorise POUR, MD;  Location: [facility] OR;  Service: Open Heart Surgery;  Laterality: N/A;   CORONARY STENT INTERVENTION N/A 01/31/2024   Procedure: CORONARY STENT INTERVENTION;  Surgeon: Mady Bruckner, MD;  Location: [facility] INVASIVE CV LAB;  Service: Cardiovascular;  Laterality: N/A;   HEMORRHOID SURGERY     LEFT HEART CATH AND CORONARY ANGIOGRAPHY Left 12/15/2023   Procedure: LEFT HEART CATH AND CORONARY ANGIOGRAPHY;  Surgeon: Darron Deatrice LABOR, MD;  Location: [facility] INVASIVE CV LAB;  Service: Cardiovascular;  Laterality: Left;   LEFT HEART CATH AND CORS/GRAFTS ANGIOGRAPHY Left 01/31/2024   Procedure: LEFT HEART CATH AND CORS/GRAFTS ANGIOGRAPHY;  Surgeon: Mady Bruckner, MD;  Location: [facility] INVASIVE CV LAB;  Service: Cardiovascular;  Laterality: Left;   TEE WITHOUT CARDIOVERSION N/A 12/19/2023   Procedure: TRANSESOPHAGEAL ECHOCARDIOGRAM (TEE);  Surgeon: Lucas Dorise POUR, MD;  Location: [facility] OR;  Service: Open Heart Surgery;  Laterality: N/A;   VASECTOMY     Family History  Problem Relation Age of Onset   Heart disease Mother    Cancer Father    Diabetes Father    Stroke Maternal Grandmother    Diabetes Maternal Grandmother    Colon cancer Neg Hx    Esophageal cancer Neg Hx    Rectal cancer Neg Hx    Stomach cancer Neg Hx    Social History   Socioeconomic History   Marital status: Married    Spouse name: Not on file   Number of  children: Not on file   Years of education: Not on file   Highest education level: Some college, no degree  Occupational History   Not on file  Tobacco Use   Smoking status: Never   Smokeless tobacco: Never  Vaping Use   Vaping status: Never Used  Substance and Sexual Activity   Alcohol use: Yes    Comment: occasionally   Drug use: No   Sexual activity: Not Currently    Birth control/protection: None  Other Topics Concern   Not on file  Social History Narrative   Lives locally.  Active but does not routinely exercise.  Daughter works for our Financial risk analyst as Charity fundraiser.   Social Drivers of Corporate investment banker Strain: Low Risk  (09/03/2024)   Overall Financial Resource Strain (CARDIA)    Difficulty of Paying Living Expenses: Not hard at  all  Food Insecurity: No Food Insecurity (09/03/2024)   Hunger Vital Sign    Worried About Running Out of Food in the Last Year: Never true    Ran Out of Food in the Last Year: Never true  Transportation Needs: No Transportation Needs (09/03/2024)   PRAPARE - Administrator, Civil Service (Medical): No    Lack of Transportation (Non-Medical): No  Physical Activity: Insufficiently Active (09/03/2024)   Exercise Vital Sign    Days of Exercise per Week: 4 days    Minutes of Exercise per Session: 30 min  Stress: No Stress Concern Present (09/03/2024)   [employer] of Occupational Health - Occupational Stress Questionnaire    Feeling of Stress: Not at all  Social Connections: Socially Integrated (09/03/2024)   Social Connection and Isolation Panel    Frequency of Communication with Friends and Family: More than three times a week    Frequency of Social Gatherings with Friends and Family: Twice a week    Attends Religious Services: More than 4 times per year    Active Member of [employer] or Organizations: Yes    Attends Engineer, structural: More than 4 times per year    Marital Status: Married    Tobacco  Counseling Counseling given: Not Answered   Clinical Intake:  Pre-visit preparation completed: Yes  Pain : No/denies pain     Diabetes: No  How often do you need to have someone help you when you read instructions, pamphlets, or other written materials from your doctor or pharmacy?: 1 - Never  Interpreter Needed?: No  Information entered by :: Mliss Graff LPN   Activities of Daily Living    09/03/2024    3:48 PM 09/02/2024   11:43 AM  In your present state of health, do you have any difficulty performing the following activities:  Hearing? 0 0  Vision? 0 0  Difficulty concentrating or making decisions? 0 0  Walking or climbing stairs? 0 0  Dressing or bathing? 0 0  Doing errands, shopping? 0 0  Preparing Food and eating ? N N  Using the Toilet? N N  In the past six months, have you accidently leaked urine? N N  Do you have problems with loss of bowel control? N N  Managing your Medications? N N  Managing your Finances? N N  Housekeeping or managing your Housekeeping? N N    Patient Care Team: Levora Reyes SAUNDERS, MD as PCP - General (Family Medicine) Darron Deatrice LABOR, MD as PCP - Cardiology (Cardiology) Darron Deatrice LABOR, MD as Consulting Physician (Cardiology) Cleotilde Elspeth CROME, OD (Optometry)  Indicate any recent Medical Services you may have received from other than [facility] providers in the past year (date may be approximate).     Assessment:   This is a routine wellness examination for Burlie.  Hearing/Vision screen Hearing Screening - Comments:: No trouble hearing Vision Screening - Comments:: Cleotilde Up to date   Goals Addressed             This Visit's Progress    Patient Stated       Continue to lower cholesteral       Depression Screen    09/03/2024    3:25 PM 08/05/2024    7:50 AM 04/17/2024    3:35 PM 01/22/2024    2:09 PM 08/04/2023    7:52 AM 05/31/2023    9:37 AM 09/01/2022    2:44 PM  PHQ 2/9 Scores  PHQ -  2 Score 0 0 0 0 0 0 0   PHQ- 9 Score 0 0 0 3 1 0     Fall Risk    09/03/2024    3:49 PM 09/02/2024   11:43 AM 08/05/2024    7:50 AM 04/17/2024    2:11 PM 04/15/2024    3:24 PM  Fall Risk   Falls in the past year? 0 0 0 0 0  Number falls in past yr: 0 0 0 0 0  Injury with Fall? 0 0 0 0 0  Risk for fall due to :   No Fall Risks Impaired balance/gait Impaired balance/gait  Follow up Falls evaluation completed;Education provided;Falls prevention discussed  Falls evaluation completed Falls evaluation completed Falls evaluation completed    MEDICARE RISK AT HOME: Medicare Risk at Home Any stairs in or around the home?: Yes If so, are there any without handrails?: No Home free of loose throw rugs in walkways, pet beds, electrical cords, etc?: Yes Adequate lighting in your home to reduce risk of falls?: Yes Life alert?: No Use of a cane, [place] or w/c?: No Grab bars in the bathroom?: No Shower chair or bench in shower?: No Elevated toilet seat or a handicapped toilet?: No  TIMED UP AND GO:  Was the test performed?  No    Cognitive Function:        09/03/2024    3:24 PM 08/05/2024    7:58 AM 05/31/2023    9:34 AM 09/01/2022    2:46 PM 08/01/2022    8:02 AM  6CIT Screen  What Year? 0 points 0 points 0 points 0 points 0 points  What month? 0 points 0 points 0 points 0 points 0 points  What time? 0 points 0 points 0 points 0 points 0 points  Count back from 20 0 points 0 points 0 points 0 points 0 points  Months in reverse 0 points 0 points 0 points 0 points 0 points  Repeat phrase 0 points 0 points 0 points 0 points 0 points  Total Score 0 points 0 points 0 points 0 points 0 points    Immunizations Immunization History  Administered Date(s) Administered    sv, Bivalent, Protein Subunit Rsvpref,pf (Abrysvo) 07/11/2024   Fluad Quad(high Dose 65+) 07/23/2020, 07/28/2021, 08/01/2022   Fluad Trivalent(High Dose 65+) 08/04/2023   INFLUENZA, HIGH DOSE SEASONAL PF 08/29/2017, 08/23/2018, 08/14/2019,  08/05/2024   Influenza Split 08/13/2014   Influenza,inj,Quad PF,6+ Mos 10/09/2013   Influenza-Unspecified 08/24/2016, 08/29/2018   Moderna Sars-Covid-2 Vaccination 12/19/2019, 01/14/2020, 09/29/2020   PFIZER(Purple Top)SARS-COV-2 Vaccination 08/29/2022   Pneumococcal Conjugate-13 09/11/2014   Pneumococcal Polysaccharide-23 04/20/2017   Td 12/23/2008, 05/10/2019   Zoster Recombinant(Shingrix) 08/14/2023, 04/30/2024   Zoster, Live 04/03/2016, 07/13/2016    TDAP status: Up to date  Flu Vaccine status: Up to date  Pneumococcal vaccine status: Up to date  Covid-19 vaccine status: Information provided on how to obtain vaccines.   Qualifies for Shingles Vaccine? No   Zostavax completed Yes   Shingrix Completed?: Yes  Screening Tests Health Maintenance  Topic Date Due   COVID-19 Vaccine (5 - 2025-26 season) 07/15/2024   Medicare Annual Wellness (AWV)  09/03/2025   DTaP/Tdap/Td (3 - Tdap) 05/09/2029   Pneumococcal Vaccine: 50+ Years  Completed   Influenza Vaccine  Completed   Hepatitis C Screening  Completed   Zoster Vaccines- Shingrix  Completed   Meningococcal B Vaccine  Aged Out   Colonoscopy  Discontinued    Health Maintenance  Health Maintenance  Due  Topic Date Due   COVID-19 Vaccine (5 - 2025-26 season) 07/15/2024    Colorectal cancer screening: No longer required.   Lung Cancer Screening: (Low Dose CT Chest recommended if Age 73-80 years, 20 pack-year currently smoking OR have quit w/in 15years.) does not qualify.   Lung Cancer Screening Referral:   Additional Screening:  Hepatitis C Screening: does not qualify; Completed 2017  Vision Screening: Recommended annual ophthalmology exams for early detection of glaucoma and other disorders of the eye. Is the patient up to date with their annual eye exam?  Yes  Who is the provider or what is the name of the office in which the patient attends annual eye exams? [facility] If pt is not established with a provider, would  they like to be referred to a provider to establish care? No .   Dental Screening: Recommended annual dental exams for proper oral hygiene    Community Resource Referral / Chronic Care Management: CRR required this visit?  No   CCM required this visit?  No     Plan:     I have personally reviewed and noted the following in the patient's chart:   Medical and social history Use of alcohol, tobacco or illicit drugs  Current medications and supplements including opioid prescriptions. Patient is not currently taking opioid prescriptions. Functional ability and status Nutritional status Physical activity Advanced directives List of other physicians Hospitalizations, surgeries, and ER visits in previous 12 months Vitals Screenings to include cognitive, depression, and falls Referrals and appointments  In addition, I have reviewed and discussed with patient certain preventive protocols, quality metrics, and best practice recommendations. A written personalized care plan for preventive services as well as general preventive health recommendations were provided to patient.     Mliss Graff, LPN   [id number]   After Visit Summary: (MyChart) Due to this being a telephonic visit, the after visit summary with patients personalized plan was offered to patient via MyChart   Nurse Notes:

## 2024-09-03 NOTE — Patient Instructions (Signed)
 Mr. Bublitz , Thank you for taking time to come for your Medicare Wellness Visit. I appreciate your ongoing commitment to your health goals. Please review the following plan we discussed and let me know if I can assist you in the future.   Screening recommendations/referrals: Colonoscopy:  Recommended yearly ophthalmology/optometry visit for glaucoma screening and checkup Recommended yearly dental visit for hygiene and checkup  Vaccinations: Influenza vaccine:  Pneumococcal vaccine:  Tdap vaccine:  Shingles vaccine:      Preventive Care 65 Years and Older, Male Preventive care refers to lifestyle choices and visits with your health care provider that can promote health and wellness. What does preventive care include? A yearly physical exam. This is also called an annual well check. Dental exams once or twice a year. Routine eye exams. Ask your health care provider how often you should have your eyes checked. Personal lifestyle choices, including: Daily care of your teeth and gums. Regular physical activity. Eating a healthy diet. Avoiding tobacco and drug use. Limiting alcohol use. Practicing safe sex. Taking low doses of aspirin  every day. Taking vitamin and mineral supplements as recommended by your health care provider. What happens during an annual well check? The services and screenings done by your health care provider during your annual well check will depend on your age, overall health, lifestyle risk factors, and family history of disease. Counseling  Your health care provider may ask you questions about your: Alcohol use. Tobacco use. Drug use. Emotional well-being. Home and relationship well-being. Sexual activity. Eating habits. History of falls. Memory and ability to understand (cognition). Work and work Astronomer. Screening  You may have the following tests or measurements: Height, weight, and BMI. Blood pressure. Lipid and cholesterol levels. These may  be checked every 5 years, or more frequently if you are over 9 years old. Skin check. Lung cancer screening. You may have this screening every year starting at age 12 if you have a 30-pack-year history of smoking and currently smoke or have quit within the past 15 years. Fecal occult blood test (FOBT) of the stool. You may have this test every year starting at age 108. Flexible sigmoidoscopy or colonoscopy. You may have a sigmoidoscopy every 5 years or a colonoscopy every 10 years starting at age 24. Prostate cancer screening. Recommendations will vary depending on your family history and other risks. Hepatitis C blood test. Hepatitis B blood test. Sexually transmitted disease (STD) testing. Diabetes screening. This is done by checking your blood sugar (glucose) after you have not eaten for a while (fasting). You may have this done every 1-3 years. Abdominal aortic aneurysm (AAA) screening. You may need this if you are a current or former smoker. Osteoporosis. You may be screened starting at age 68 if you are at high risk. Talk with your health care provider about your test results, treatment options, and if necessary, the need for more tests. Vaccines  Your health care provider may recommend certain vaccines, such as: Influenza vaccine. This is recommended every year. Tetanus, diphtheria, and acellular pertussis (Tdap, Td) vaccine. You may need a Td booster every 10 years. Zoster vaccine. You may need this after age 26. Pneumococcal 13-valent conjugate (PCV13) vaccine. One dose is recommended after age 56. Pneumococcal polysaccharide (PPSV23) vaccine. One dose is recommended after age 81. Talk to your health care provider about which screenings and vaccines you need and how often you need them. This information is not intended to replace advice given to you by your health care provider.  Make sure you discuss any questions you have with your health care provider. Document Released: 11/27/2015  Document Revised: 07/20/2016 Document Reviewed: 09/01/2015 Elsevier Interactive Patient Education  2017 [employer].  Fall Prevention in the Home Falls can cause injuries. They can happen to people of all ages. There are many things you can do to make your home safe and to help prevent falls. What can I do on the outside of my home? Regularly fix the edges of walkways and driveways and fix any cracks. Remove anything that might make you trip as you walk through a door, such as a raised step or threshold. Trim any bushes or trees on the path to your home. Use bright outdoor lighting. Clear any walking paths of anything that might make someone trip, such as rocks or tools. Regularly check to see if handrails are loose or broken. Make sure that both sides of any steps have handrails. Any raised decks and porches should have guardrails on the edges. Have any leaves, snow, or ice cleared regularly. Use sand or salt on walking paths during winter. Clean up any spills in your garage right away. This includes oil or grease spills. What can I do in the bathroom? Use night lights. Install grab bars by the toilet and in the tub and shower. Do not use towel bars as grab bars. Use non-skid mats or decals in the tub or shower. If you need to sit down in the shower, use a plastic, non-slip stool. Keep the floor dry. Clean up any water that spills on the floor as soon as it happens. Remove soap buildup in the tub or shower regularly. Attach bath mats securely with double-sided non-slip rug tape. Do not have throw rugs and other things on the floor that can make you trip. What can I do in the bedroom? Use night lights. Make sure that you have a light by your bed that is easy to reach. Do not use any sheets or blankets that are too big for your bed. They should not hang down onto the floor. Have a firm chair that has side arms. You can use this for support while you get dressed. Do not have throw rugs  and other things on the floor that can make you trip. What can I do in the kitchen? Clean up any spills right away. Avoid walking on wet floors. Keep items that you use a lot in easy-to-reach places. If you need to reach something above you, use a strong step stool that has a grab bar. Keep electrical cords out of the way. Do not use floor polish or wax that makes floors slippery. If you must use wax, use non-skid floor wax. Do not have throw rugs and other things on the floor that can make you trip. What can I do with my stairs? Do not leave any items on the stairs. Make sure that there are handrails on both sides of the stairs and use them. Fix handrails that are broken or loose. Make sure that handrails are as long as the stairways. Check any carpeting to make sure that it is firmly attached to the stairs. Fix any carpet that is loose or worn. Avoid having throw rugs at the top or bottom of the stairs. If you do have throw rugs, attach them to the floor with carpet tape. Make sure that you have a light switch at the top of the stairs and the bottom of the stairs. If you do not have them,  ask someone to add them for you. What else can I do to help prevent falls? Wear shoes that: Do not have high heels. Have rubber bottoms. Are comfortable and fit you well. Are closed at the toe. Do not wear sandals. If you use a stepladder: Make sure that it is fully opened. Do not climb a closed stepladder. Make sure that both sides of the stepladder are locked into place. Ask someone to hold it for you, if possible. Clearly mark and make sure that you can see: Any grab bars or handrails. First and last steps. Where the edge of each step is. Use tools that help you move around (mobility aids) if they are needed. These include: Canes. Walkers. Scooters. Crutches. Turn on the lights when you go into a dark area. Replace any light bulbs as soon as they burn out. Set up your furniture so you have a  clear path. Avoid moving your furniture around. If any of your floors are uneven, fix them. If there are any pets around you, be aware of where they are. Review your medicines with your doctor. Some medicines can make you feel dizzy. This can increase your chance of falling. Ask your doctor what other things that you can do to help prevent falls. This information is not intended to replace advice given to you by your health care provider. Make sure you discuss any questions you have with your health care provider. Document Released: 08/27/2009 Document Revised: 04/07/2016 Document Reviewed: 12/05/2014 Elsevier Interactive Patient Education  2017 [employer].

## 2024-09-20 ENCOUNTER — Other Ambulatory Visit: Payer: Self-pay | Admitting: Cardiovascular Disease

## 2024-09-20 NOTE — Telephone Encounter (Signed)
 Scheduled

## 2024-09-20 NOTE — Telephone Encounter (Signed)
Please contact pt for future appointment. Pt overdue for follow up.

## 2024-10-02 DIAGNOSIS — J322 Chronic ethmoidal sinusitis: Secondary | ICD-10-CM | POA: Diagnosis not present

## 2024-10-02 DIAGNOSIS — J320 Chronic maxillary sinusitis: Secondary | ICD-10-CM | POA: Diagnosis not present

## 2024-10-02 DIAGNOSIS — J342 Deviated nasal septum: Secondary | ICD-10-CM | POA: Diagnosis not present

## 2024-10-02 DIAGNOSIS — J343 Hypertrophy of nasal turbinates: Secondary | ICD-10-CM | POA: Diagnosis not present

## 2024-10-02 DIAGNOSIS — J321 Chronic frontal sinusitis: Secondary | ICD-10-CM | POA: Diagnosis not present

## 2024-10-08 ENCOUNTER — Encounter: Payer: Self-pay | Admitting: Cardiovascular Disease

## 2024-10-08 ENCOUNTER — Ambulatory Visit: Attending: Cardiovascular Disease | Admitting: Cardiovascular Disease

## 2024-10-08 ENCOUNTER — Other Ambulatory Visit: Payer: Self-pay

## 2024-10-08 VITALS — BP 136/72 | HR 60 | Ht 68.0 in | Wt 189.0 lb

## 2024-10-08 DIAGNOSIS — I493 Ventricular premature depolarization: Secondary | ICD-10-CM | POA: Diagnosis not present

## 2024-10-08 DIAGNOSIS — I25118 Atherosclerotic heart disease of native coronary artery with other forms of angina pectoris: Secondary | ICD-10-CM | POA: Diagnosis not present

## 2024-10-08 DIAGNOSIS — I10 Essential (primary) hypertension: Secondary | ICD-10-CM

## 2024-10-08 DIAGNOSIS — E785 Hyperlipidemia, unspecified: Secondary | ICD-10-CM | POA: Diagnosis not present

## 2024-10-08 MED ORDER — NITROGLYCERIN 0.4 MG SL SUBL: 0.4000 mg | SUBLINGUAL_TABLET | SUBLINGUAL | 3 refills | Status: AC | PRN

## 2024-10-08 MED ORDER — CLOPIDOGREL BISULFATE 75 MG PO TABS: 75.0000 mg | ORAL_TABLET | Freq: Every day | ORAL | 3 refills | Status: AC

## 2024-10-08 MED ORDER — EZETIMIBE 10 MG PO TABS: 10.0000 mg | ORAL_TABLET | Freq: Every day | ORAL | 3 refills | Status: AC

## 2024-10-08 MED ORDER — ATORVASTATIN CALCIUM 40 MG PO TABS: 40.0000 mg | ORAL_TABLET | Freq: Every day | ORAL | 3 refills | Status: AC

## 2024-10-08 MED ORDER — CARVEDILOL 6.25 MG PO TABS: 6.2500 mg | ORAL_TABLET | Freq: Two times a day (BID) | ORAL | 3 refills | Status: AC

## 2024-10-08 MED ORDER — ISOSORBIDE MONONITRATE ER 60 MG PO TB24: 60.0000 mg | ORAL_TABLET | Freq: Every day | ORAL | 3 refills | Status: AC

## 2024-10-08 NOTE — Progress Notes (Signed)
 Cardiology Office Note   Date:  10/08/2024   ID:  Terry Vega, DOB [birth year], MRN [id number]  PCP:  Terry Reyes SAUNDERS, MD  Cardiologist:   Deatrice Cage, MD   Chief Complaint  Patient presents with   Follow-up    6 month follow up  / pt has been doing well with no complaints of chest pain, chest pressure or SOB, medication reviewed verbally with patient .       History of Present Illness: Terry Vega is a 78 y.o. male who presents for a followup visit regarding coronary artery disease and PVCs.   He has prolonged history of hypertension. He has hypothyroidism and has been on replacement therapy for many years.  He has been followed for a long time for PVCs that responded to a beta-blocker. He had unstable angina in January of this year.  Cardiac catheterization showed severe three-vessel coronary artery disease.  He was transferred to [facility] and underwent CABG with LIMA to LAD, SVG to first diagonal, SVG to second diagonal, SVG to OM and SVG to distal RCA.  Postoperative course was uncomplicated. He was seen in the office in February and was doing well with no anginal symptoms.  However, he was seen on March 18 with recurrent angina.  Thus, he underwent cardiac catheterization which showed stable native three-vessel coronary artery disease with 2 occluded grafts including SVG to OM and SVG to first diagonal.  LIMA to LAD, SVG to second diagonal and SVG to right PDA were patent.  EF was normal with an LVEDP of 20 mmHg. Native left circumflex had complex bifurcation mid disease with subtotal occlusion.  PCI was attempted but was not able to cross with a wire.  Imdur  was added.  He has been doing extremely well with no exertional chest pain, shortness of breath or palpitation.  He walks at least 1 mile daily for exercise.  Past Medical History:  Diagnosis Date   Allergy    Arthritis    Phreesia 07/21/2020   CAD (coronary artery disease)    a. 11/2023 Cath: LM 81m/d,  LAD 70p/m, D1 99, D2 80, LCX 70ost, 71m/d, OM2 90, RCA 40p/m, 90d, EF 55-65%; b. 12/2023 CABG x 5: LIMA->LAD, VG->D1, VG->D2, VG->OM, VG->dRCA.   Diastolic dysfunction    a. 07/2021 Echo: EF 60-65%.  Mild LVH.  Grade 1 DD.  Normal RV fxn. mildly dil LA. Triv MR/AI. Mild AoV sclerosis.   Heart murmur    Hypertension    Hypothyroidism    PVC's (premature ventricular contractions)     Past Surgical History:  Procedure Laterality Date   COLONOSCOPY     CORONARY ARTERY BYPASS GRAFT N/A 12/19/2023   Procedure: CORONARY ARTERY BYPASS GRAFTING (CABG) TIMES FIVE USING LEFT INTERNAL MAMMARY ARTERY AND BILATERAL ENDOSCOPICALLY HARVESTED GREATER SAPHENOUS VEIN;  Surgeon: Lucas Dorise POUR, MD;  Location: [facility] OR;  Service: Open Heart Surgery;  Laterality: N/A;   CORONARY STENT INTERVENTION N/A 01/31/2024   Procedure: CORONARY STENT INTERVENTION;  Surgeon: Mady Bruckner, MD;  Location: [facility] INVASIVE CV LAB;  Service: Cardiovascular;  Laterality: N/A;   HEMORRHOID SURGERY     LEFT HEART CATH AND CORONARY ANGIOGRAPHY Left 12/15/2023   Procedure: LEFT HEART CATH AND CORONARY ANGIOGRAPHY;  Surgeon: Cage Deatrice LABOR, MD;  Location: [facility] INVASIVE CV LAB;  Service: Cardiovascular;  Laterality: Left;   LEFT HEART CATH AND CORS/GRAFTS ANGIOGRAPHY Left 01/31/2024   Procedure: LEFT HEART CATH AND CORS/GRAFTS ANGIOGRAPHY;  Surgeon: Mady Bruckner, MD;  Location: [facility] INVASIVE CV LAB;  Service: Cardiovascular;  Laterality: Left;   TEE WITHOUT CARDIOVERSION N/A 12/19/2023   Procedure: TRANSESOPHAGEAL ECHOCARDIOGRAM (TEE);  Surgeon: Lucas Dorise POUR, MD;  Location: [facility] OR;  Service: Open Heart Surgery;  Laterality: N/A;   VASECTOMY       Current Outpatient Medications  Medication Sig Dispense Refill   amoxicillin -clavulanate (AUGMENTIN) 875-125 MG tablet Take 1 tablet by mouth 2 (two) times daily.     aspirin  EC 81 MG tablet Take 1 tablet (81 mg total) by mouth daily. Swallow whole.     atorvastatin  (LIPITOR) 40 MG tablet  Take 1 tablet by mouth once daily 90 tablet 3   carvedilol  (COREG ) 6.25 MG tablet Take 1 tablet (6.25 mg total) by mouth 2 (two) times daily. 180 tablet 3   cholecalciferol (VITAMIN D ) 1000 UNITS tablet Take 1,000 Units by mouth daily.     clopidogrel  (PLAVIX ) 75 MG tablet Take 1 tablet (75 mg total) by mouth daily. 90 tablet 3   ezetimibe  (ZETIA ) 10 MG tablet Take 1 tablet (10 mg total) by mouth daily. 90 tablet 3   isosorbide  mononitrate (IMDUR ) 60 MG 24 hr tablet TAKE 1 TABLET EVERY DAY 90 tablet 0   levothyroxine  (SYNTHROID ) 100 MCG tablet Take 1 tablet (100 mcg total) by mouth daily. 90 tablet 3   loratadine (CLARITIN) 10 MG tablet Take 10 mg by mouth daily.     Multiple Vitamin (MULTIVITAMIN) tablet Take 1 tablet by mouth daily.     nitroGLYCERIN  (NITROSTAT ) 0.4 MG SL tablet Place 0.4 mg under the tongue every 5 (five) minutes as needed for chest pain.     triamcinolone  cream (KENALOG ) 0.1 % Apply 1 Application topically daily as needed (irritation).     vitamin E 1000 UNIT capsule Take 1,000 Units by mouth daily.     No current facility-administered medications for this visit.    Allergies:   Patient has no known allergies.    Social History:  The patient  reports that he has never smoked. He has never used smokeless tobacco. He reports current alcohol use. He reports that he does not use drugs.   Family History:  The patient's family history includes Cancer in his father; Diabetes in his father and maternal grandmother; Heart disease in his mother; Stroke in his maternal grandmother.    ROS:  Please see the history of present illness.   Otherwise, review of systems are positive for none.   All other systems are reviewed and negative.    PHYSICAL EXAM: VS:  BP 136/72 (BP Location: Left Arm, Patient Position: Sitting, Cuff Size: Normal)   Pulse 60   Ht 5' 8 (1.727 m)   Wt 189 lb (85.7 kg)   SpO2 99%   BMI 28.74 kg/m  , BMI Body mass index is 28.74 kg/m. GEN: Well nourished,  well developed, in no acute distress  HEENT: normal  Neck: no JVD, carotid bruits, or masses Cardiac: RRR; no murmurs, rubs, or gallops,no edema  Respiratory:  clear to auscultation bilaterally, normal work of breathing GI: soft, nontender, nondistended, + BS MS: no deformity or atrophy  Skin: warm and dry, no rash Neuro:  Strength and sensation are intact Psych: euthymic mood, full affect   EKG:  EKG is ordered today.  Normal sinus rhythm Normal ECG When compared with ECG of 08-Feb-2024 10:22, Nonspecific T wave abnormality no longer evident in Lateral leads       Recent Labs: 12/20/2023: Magnesium  2.5 01/30/2024: Hemoglobin 12.6;  Platelets 249 08/05/2024: ALT 13; BUN 14; Creatinine, Ser 0.75; Potassium 4.1; Sodium 141; TSH 3.75    Lipid Panel    Component Value Date/Time   CHOL 97 08/05/2024 0833   CHOL 99 (L) 05/13/2024 1040   TRIG 57.0 08/05/2024 0833   HDL 34.20 (L) 08/05/2024 0833   HDL 35 (L) 05/13/2024 1040   CHOLHDL 3 08/05/2024 0833   VLDL 11.4 08/05/2024 0833   LDLCALC 52 08/05/2024 0833   LDLCALC 48 05/13/2024 1040      Wt Readings from Last 3 Encounters:  10/08/24 189 lb (85.7 kg)  09/03/24 186 lb (84.4 kg)  08/05/24 186 lb (84.4 kg)         No data to display            ASSESSMENT AND PLAN:  1.  Coronary artery disease involving bypass graft with stable angina symptoms.  He reports resolution of exertional chest pain since increasing Imdur .  This will be continued with carvedilol .   He is on long-term dual antiplatelet therapy.  2.  PVCs: No evidence of recurrent palpitations on carvedilol .    3. Essential hypertension: His blood pressure is well-controlled on current medications including carvedilol  and Imdur .  4.  Hyperlipidemia: His LDL was not at target with atorvastatin  alone but ezetimibe  was added.  Most recent lipid profile showed an LDL of 52.  5.  Preop cardiovascular evaluation for cataract surgery: Low risk from a cardiac  standpoint.  Plavix  can be held 5 days before surgery if needed.   Disposition:   FU with me in 12 months.  Signed,  Deatrice Cage, MD  10/08/2024 2:46 PM    [facility]

## 2024-10-08 NOTE — Patient Instructions (Signed)
 Medication Instructions:  Your physician recommends that you continue on your current medications as directed. Please refer to the Current Medication list given to you today.    *If you need a refill on your cardiac medications before your next appointment, please call your pharmacy*  Lab Work: No labs ordered today    Testing/Procedures: No test ordered today   Follow-Up: At [facility], you and your health needs are our priority.  As part of our continuing mission to provide you with exceptional heart care, our providers are all part of one team.  This team includes your primary Cardiologist (physician) and Advanced Practice Providers or APPs (Physician Assistants and Nurse Practitioners) who all work together to provide you with the care you need, when you need it.  Your next appointment:   1 year(s)  Provider:   Deatrice Cage, MD

## 2024-10-21 ENCOUNTER — Telehealth: Payer: Self-pay | Admitting: Cardiovascular Disease

## 2024-10-21 NOTE — Telephone Encounter (Signed)
   Pre-operative Risk Assessment    Patient Name: KOLE HILYARD  DOB: July 22, 1946 MRN: [id number]   Date of last office visit: 10/08/24  Date of next office visit: n/a    Request for Surgical Clearance    Procedure:  Cataract surgery   Date of Surgery:  Clearance 10/23/24                                Surgeon:  Dr. Evalene Snowball  Surgeon's Group or Practice Name:  [facility]  Phone number:  [phone] Fax number:  [phone]    Type of Clearance Requested:   - Medical    Type of Anesthesia:  MAC   Additional requests/questions:    SignedSheffield JONELLE Cable   10/21/2024, 10:22 AM

## 2024-10-21 NOTE — Telephone Encounter (Signed)
   Patient Name: Terry Vega  DOB: 18-Aug-1946 MRN: [id number]  Primary Cardiologist: Deatrice Cage, MD  Chart reviewed as part of pre-operative protocol coverage. Cataract extractions are recognized in guidelines as low risk surgeries that do not typically require specific preoperative testing or holding of blood thinner therapy. Therefore, given past medical history and time since last visit, based on ACC/AHA guidelines, Terry Vega would be at acceptable risk for the planned procedure without further cardiovascular testing.   I will route this recommendation to the requesting party via Epic fax function and remove from pre-op pool.  Please call with questions.  Terry LOISE Fabry, PA-C 10/21/2024, 11:22 AM

## 2024-10-23 DIAGNOSIS — H2512 Age-related nuclear cataract, left eye: Secondary | ICD-10-CM | POA: Diagnosis not present

## 2024-10-23 DIAGNOSIS — H5371 Glare sensitivity: Secondary | ICD-10-CM | POA: Diagnosis not present

## 2024-10-24 ENCOUNTER — Telehealth: Payer: Self-pay

## 2024-10-24 DIAGNOSIS — Z8669 Personal history of other diseases of the nervous system and sense organs: Secondary | ICD-10-CM

## 2024-10-24 NOTE — Telephone Encounter (Signed)
 Okay to send referral to [facility] for 1 week post op ?

## 2024-10-24 NOTE — Telephone Encounter (Signed)
 Copied from CRM #[id number]. Topic: General - Other >> Oct 24, 2024  1:37 PM Paige D wrote: Reason for CRM: [facility] to pt  1 week post pop appt  on12/17/2025 after cateract surgery.  They are out of network with pt insurance. Pt is going to need a referral for [facility] for the 1 week post opp appt so the office can file out of network benefits. If any further info is needed please call miss Dena with [facility] specialties  Call back  # [phone]  Fax # [phone]

## 2024-10-25 NOTE — Telephone Encounter (Signed)
 Ok to refer to [place] [facility] for post op appt. Let me know if I need to help otherwise.

## 2024-10-28 NOTE — Addendum Note (Signed)
 Addended by: Molli Gethers A on: 10/28/2024 11:55 AM   Modules accepted: Orders

## 2024-10-30 ENCOUNTER — Telehealth: Payer: Self-pay | Admitting: Cardiovascular Disease

## 2024-10-30 NOTE — Telephone Encounter (Signed)
° °  Pre-operative Risk Assessment    Patient Name: Terry Vega  DOB: July 10, 1946 MRN: [id number]   Date of last office visit: 10/08/24  Date of next office visit: n/a    Request for Surgical Clearance    Procedure:  Max astronomy without tissue, complete ethmoid, frontal sinus, sigmoid without tissue, stereotactic CT guided navigation  Date of Surgery:  Clearance TBD                                Surgeon:  Dr. Luciano  Surgeon's Group or Practice Name:  [facility]  Phone number:  [phone]  Fax number:  [phone]    Type of Clearance Requested:   - Medical  - Pharmacy:  Hold Clopidogrel  (Plavix ) 5 days prior and any other meds cardiology recommends     Type of Anesthesia:  General    Additional requests/questions:    [name]   10/30/2024, 2:06 PM

## 2024-10-30 NOTE — Telephone Encounter (Signed)
 Will forward to Dr. Darron given recent OV 10/08/24 to review if OK to undergo ENT surgery  - was cleared for cataract surgery at that visit, but this is under general anesthesia, so different risk procedure. Plavix  was already referenced OK to hold in that OV, just need medical aspect to ensure same clearance applies. Dr. Darron - Please route response to P CV DIV PREOP (the pre-op pool). Thank you.

## 2024-10-30 NOTE — Telephone Encounter (Signed)
 Carla patient needs a PA?   If any further info is needed please call miss Dena with [facility] vision specialties  Call back  # [phone]  Fax # [phone]

## 2024-10-30 NOTE — Telephone Encounter (Unsigned)
 Copied from CRM #[id number]. Topic: General - Other >> Oct 29, 2024  4:02 PM Thersia BROCKS wrote: Reason for CRM: [facility] - received a referral,actually needs prior auth through [facility] for post op visit with provider   with date of service when he has surgery, patient has appointment tomorrow with them   [phone]

## 2024-10-31 NOTE — Telephone Encounter (Signed)
° °  Name: [name]  DOB: [birth year]  MRN: [id number]   Primary Cardiologist: Deatrice Cage, MD  Chart reviewed as part of pre-operative protocol coverage. Terry Vega was last seen on 10/08/2024 by Dr. Cage.  Per Dr. Cage Low risk from a cardiac standpoint.  Therefore, based on ACC/AHA guidelines, the patient would be an acceptable risk for the planned procedure without further cardiovascular testing.   Per Cage, he may hold Plavix  for 5 days prior to procedure and should resume as soon as hemodynamically stable postoperatively.   I will route this recommendation to the requesting party via Epic fax function and remove from pre-op pool. Please call with questions.  Barnie Hila, NP 10/31/2024, 1:10 PM

## 2024-10-31 NOTE — Telephone Encounter (Signed)
Low risk from a cardiac standpoint. °

## 2024-10-31 NOTE — Telephone Encounter (Signed)
 Faxed results of [employer] Authorization request for PostOp with Dr. Elspeth Pinal, OD. No authorization required for this CPT. Faxed to [phone] per [facility]anna's request.

## 2025-01-08 ENCOUNTER — Ambulatory Visit (HOSPITAL_COMMUNITY): Admit: 2025-01-08 | Admitting: Otolaryngology

## 2025-01-08 SURGERY — SINUS SURGERY, ENDOSCOPIC, USING COMPUTER-ASSISTED NAVIGATION
Anesthesia: General | Laterality: Left

## 2025-08-06 ENCOUNTER — Encounter: Admitting: Family Medicine

## 2025-09-16 ENCOUNTER — Ambulatory Visit
